# Patient Record
Sex: Male | Born: 1950 | Race: White | Hispanic: No | Marital: Married | Smoking: Former smoker
Health system: Southern US, Community
[De-identification: ages and names within clinical notes are randomized; demographics above are authoritative.]

## PROBLEM LIST (undated history)

## (undated) DIAGNOSIS — K760 Fatty (change of) liver, not elsewhere classified: Secondary | ICD-10-CM

## (undated) DIAGNOSIS — E119 Type 2 diabetes mellitus without complications: Secondary | ICD-10-CM

## (undated) DIAGNOSIS — M1990 Unspecified osteoarthritis, unspecified site: Secondary | ICD-10-CM

## (undated) DIAGNOSIS — K219 Gastro-esophageal reflux disease without esophagitis: Secondary | ICD-10-CM

## (undated) DIAGNOSIS — I10 Essential (primary) hypertension: Secondary | ICD-10-CM

## (undated) DIAGNOSIS — R7303 Prediabetes: Secondary | ICD-10-CM

## (undated) DIAGNOSIS — K635 Polyp of colon: Secondary | ICD-10-CM

## (undated) DIAGNOSIS — K644 Residual hemorrhoidal skin tags: Secondary | ICD-10-CM

## (undated) DIAGNOSIS — N183 Chronic kidney disease, stage 3 unspecified: Secondary | ICD-10-CM

## (undated) HISTORY — DX: Polyp of colon: K63.5

## (undated) HISTORY — DX: Type 2 diabetes mellitus without complications: E11.9

## (undated) HISTORY — DX: Chronic kidney disease, stage 3 unspecified: N18.30

## (undated) HISTORY — DX: Prediabetes: R73.03

## (undated) HISTORY — DX: Fatty (change of) liver, not elsewhere classified: K76.0

---

## 2000-06-05 ENCOUNTER — Ambulatory Visit (HOSPITAL_COMMUNITY): Admission: RE | Admit: 2000-06-05 | Discharge: 2000-06-05 | Payer: Self-pay | Admitting: *Deleted

## 2001-08-19 ENCOUNTER — Encounter (INDEPENDENT_AMBULATORY_CARE_PROVIDER_SITE_OTHER): Payer: Self-pay

## 2001-08-19 ENCOUNTER — Ambulatory Visit (HOSPITAL_COMMUNITY): Admission: RE | Admit: 2001-08-19 | Discharge: 2001-08-19 | Payer: Self-pay | Admitting: *Deleted

## 2007-02-19 ENCOUNTER — Ambulatory Visit (HOSPITAL_COMMUNITY): Admission: RE | Admit: 2007-02-19 | Discharge: 2007-02-19 | Payer: Self-pay | Admitting: *Deleted

## 2007-02-19 ENCOUNTER — Encounter (INDEPENDENT_AMBULATORY_CARE_PROVIDER_SITE_OTHER): Payer: Self-pay | Admitting: *Deleted

## 2010-06-14 NOTE — Op Note (Signed)
NAME:  Bradley Baldwin, Bradley Baldwin NO.:  [id number]   MEDICAL RECORD NO.:  [id number]          PATIENT TYPE:  AMB   LOCATION:  ENDO                         FACILITY:  [facility]   PHYSICIAN:  Georgiana Spinner, M.D.    DATE OF BIRTH:  [birth year]   DATE OF PROCEDURE:  02/19/2007  DATE OF DISCHARGE:                               OPERATIVE REPORT   PROCEDURE:  Colonoscopy.   INDICATIONS:  Colon polyps.   ANESTHESIA:  1. Fentanyl 62.5 mcg.  2. Versed 7 mg.   PROCEDURE:  With the patient mildly sedated in the left lateral  decubitus position, a rectal examination was attempted.  Subsequently,  the Pentax videoscopic colonoscope was inserted into the rectum and  passed under direct vision to the cecum identified by crow's foot of  cecum, ileocecal valve, both which were photographed.  From this point,  the colonoscope was slowly withdrawn taking circumferential views of the  colonic mucosa stopping in the splenic flexure area where a polyp was  seen, photographed, and removed using snare cautery technique setting of  20/150 blended current.  There was a small amount of residual polyp  remaining which I then removed using hot biopsy forceps with the same  setting.  The endoscope was then withdrawn all the way to the rectum  which appeared normal on direct and showed hemorrhoids on retroflexed  view.  The endoscope was straightened and withdrawn.  The patient's  vital signs, pulse oximeter remained stable.  The patient tolerated the  procedure well without apparent complication.   FINDINGS:  Polyp of splenic flexure.  Internal hemorrhoids and otherwise  an unremarkable exam.   PLAN:  Await biopsy report.  The patient will call me for results and  follow-up with me as an outpatient.           ______________________________  Georgiana Spinner, M.D.     GMO/MEDQ  D:  02/19/2007  T:  02/19/2007  Job:  [id number]

## 2010-06-17 NOTE — Procedures (Signed)
[facility]  Patient:    Bradley Baldwin, Bradley Baldwin Visit Number: [id number] MRN: [id number]          Service Type: END Location: ENDO Attending Physician:  Sabino Gasser Dictated by:   Sabino Gasser, M.D. Proc. Date: 08/19/01 Admit Date:  08/19/2001 Discharge Date: 08/19/2001                             Procedure Report  PROCEDURE:  Colonoscopy with biopsy.  INDICATION FOR PROCEDURE:  Colon polyp.  ANESTHESIA:  Demerol 90, Versed 9 mg.  DESCRIPTION OF PROCEDURE:  With the patient mildly sedated in the left lateral decubitus position, the Olympus videoscopic colonoscope was inserted into the rectum after a normal rectal exam and passed under direct vision to the cecum identified by the ileocecal valve and appendiceal orifice both of which were photographed. From this point, the colonoscope was slowly withdrawn taking circumferential views of the entire colonic mucosa, stopping only in the sigmoid colon where a previously noted polyp was seen, photographed and removed using hot biopsy forceps technique on a setting of 20:20 blended current. The endoscope was then withdrawn all the way to the rectum which appeared normal on direct and retroflexed view showed hemorrhoids. The endoscope was straightened and withdrawn.  The patients vital signs and pulse oximeter remained stable. The patient tolerated the procedure well without apparent complications.  FINDINGS:  Question of polyp 20 cm from the anal verge. Await biopsy report. The patient will call me for results and followup with me as an outpatient. Internal hemorrhoids and some diverticulosis of the sigmoid colon. Dictated by:   Sabino Gasser, M.D. Attending Physician:  Sabino Gasser DD:  08/19/01 TD:  08/23/01 Job: [id number]

## 2010-06-17 NOTE — Procedures (Signed)
[place]  Patient:    Bradley Baldwin, Bradley Baldwin                       MRN: [id number] Proc. Date: 06/05/00 Adm. Date:  [id number] Attending:  Sabino Gasser                           Procedure Report  PROCEDURE PERFORMED:  Colonoscopy.  INDICATIONS:  Rectal bleeding.  ANESTHESIA:  Demerol 60, Versed 6 mg.  DESCRIPTION OF PROCEDURE:  The patient mildly sedated in the left lateral decubitus position, the Olympus variable stiffness colonoscope was inserted into the rectum and passed under direct vision to the cecum identified by ileocecal valve and appendiceal orifice.  We entered into the terminal ileum which also appeared normal.  From this point, the colonoscope was slowly withdrawn taking circumferential views of the entire colonic mucosa.  Stopping in the sigmoid colon area where there appeared to be a small polyp but we could not discern this out after turning the scope directly to find it.  We then passed the scope back and forth two or three more times.  We could not find a polyp and therefore withdrew the colonoscope taking circumferential views of the entire colonic mucosa.  Stopping to photograph diverticula seen along the way in the sigmoid colon until we reached the rectum which appeared normal on direct view and showed internal hemorrhoids on retroflexed view. The endoscope was straightened and withdrawn.  The patients vital signs and pulse oximetry remained stable.  The patient tolerated the procedure well and left the operating room without apparent complications.  FINDINGS:  Question of a small polyp in the sigmoid colon.  Diverticulosis of the sigmoid colon.  Internal hemorrhoids.  PLAN:  Will follow the patient and repeat examination possibly in one year. DD:  06/05/00 TD:  06/05/00 Job: [id number]

## 2011-10-30 ENCOUNTER — Other Ambulatory Visit: Payer: Self-pay | Admitting: Family Medicine

## 2011-10-30 DIAGNOSIS — R748 Abnormal levels of other serum enzymes: Secondary | ICD-10-CM

## 2011-10-31 ENCOUNTER — Other Ambulatory Visit: Payer: Self-pay | Admitting: Orthopaedic Surgery

## 2011-11-02 ENCOUNTER — Ambulatory Visit
Admission: RE | Admit: 2011-11-02 | Discharge: 2011-11-02 | Disposition: A | Discharge status: Home / Self Care | Payer: BC Managed Care – PPO | Source: Ambulatory Visit | Attending: Family Medicine | Admitting: Family Medicine

## 2011-11-02 DIAGNOSIS — R748 Abnormal levels of other serum enzymes: Secondary | ICD-10-CM

## 2011-11-20 ENCOUNTER — Encounter (HOSPITAL_COMMUNITY): Payer: Self-pay | Admitting: Pharmacy Technician

## 2011-11-21 ENCOUNTER — Encounter (HOSPITAL_COMMUNITY): Payer: Self-pay

## 2011-11-21 ENCOUNTER — Ambulatory Visit (HOSPITAL_COMMUNITY)
Admission: RE | Admit: 2011-11-21 | Discharge: 2011-11-21 | Disposition: A | Discharge status: Home / Self Care | Payer: BC Managed Care – PPO | Source: Ambulatory Visit | Attending: Orthopaedic Surgery | Admitting: Orthopaedic Surgery

## 2011-11-21 ENCOUNTER — Encounter (HOSPITAL_COMMUNITY)
Admission: RE | Admit: 2011-11-21 | Discharge: 2011-11-21 | Disposition: A | Discharge status: Home / Self Care | Payer: BC Managed Care – PPO | Source: Ambulatory Visit | Attending: Orthopaedic Surgery | Admitting: Orthopaedic Surgery

## 2011-11-21 DIAGNOSIS — I10 Essential (primary) hypertension: Secondary | ICD-10-CM | POA: Insufficient documentation

## 2011-11-21 DIAGNOSIS — Z01818 Encounter for other preprocedural examination: Secondary | ICD-10-CM | POA: Insufficient documentation

## 2011-11-21 HISTORY — DX: Essential (primary) hypertension: I10

## 2011-11-21 LAB — BASIC METABOLIC PANEL
BUN: 14 mg/dL (ref 6–23)
CO2: 26 mEq/L (ref 19–32)
Chloride: 97 mEq/L (ref 96–112)
Creatinine, Ser: 0.89 mg/dL (ref 0.50–1.35)
GFR calc Af Amer: >90 mL/min (ref >90)
Potassium: 4.7 mEq/L (ref 3.5–5.1)

## 2011-11-21 LAB — SURGICAL PCR SCREEN: MRSA, PCR: NEGATIVE

## 2011-11-21 LAB — CBC WITH DIFFERENTIAL/PLATELET
Basophils Relative: 1 % (ref 0–1)
Eosinophils Relative: 2 % (ref 0–5)
HCT: 44.1 % (ref 39.0–52.0)
Hemoglobin: 15.8 g/dL (ref 13.0–17.0)
Lymphocytes Relative: 39 % (ref 12–46)
MCHC: 35.8 g/dL (ref 30.0–36.0)
MCV: 96.5 fL (ref 78.0–100.0)
Monocytes Absolute: 1 10*3/uL (ref 0.1–1.0)
Monocytes Relative: 10 % (ref 3–12)
Neutro Abs: 4.6 10*3/uL (ref 1.7–7.7)

## 2011-11-21 LAB — URINALYSIS, ROUTINE W REFLEX MICROSCOPIC
Leukocytes, UA: NEGATIVE
Protein, ur: NEGATIVE mg/dL
Specific Gravity, Urine: 1.01 (ref 1.005–1.030)
Urobilinogen, UA: 0.2 mg/dL (ref 0.0–1.0)

## 2011-11-21 LAB — TYPE AND SCREEN
ABO/RH(D): A POS
Antibody Screen: NEGATIVE

## 2011-11-21 NOTE — Pre-Procedure Instructions (Signed)
20 SHADEE MONTOYA  11/21/2011   Your procedure is scheduled on:  11/28/11  Report to [facility] at 600 AM.  Call this number if you have problems the morning of surgery: [phone]   Remember:   Do not eat food:After Midnight.    Take these medicines the morning of surgery with A SIP OF WATER: atenolol   Do not wear jewelry, make-up or nail polish.  Do not wear lotions, powders, or perfumes. You may wear deodorant.  Do not shave 48 hours prior to surgery. Men may shave face and neck.  Do not bring valuables to the hospital.  Contacts, dentures or bridgework may not be worn into surgery.  Leave suitcase in the car. After surgery it may be brought to your room.  For patients admitted to the hospital, checkout time is 11:00 AM the day of discharge.   Patients discharged the day of surgery will not be allowed to drive home.  Name and phone number of your driver: family  Special Instructions: Shower using CHG 2 nights before surgery and the night before surgery.  If you shower the day of surgery use CHG.  Use special wash - you have one bottle of CHG for all showers.  You should use approximately 1/3 of the bottle for each shower.   Please read over the following fact sheets that you were given: Pain Booklet, Coughing and Deep Breathing, Blood Transfusion Information, MRSA Information and Surgical Site Infection Prevention

## 2011-11-24 NOTE — H&P (Signed)
TOTAL HIP ADMISSION H&P  Patient is admitted for right total hip arthroplasty.  Subjective:  Chief Complaint: right hip pain  HPI: Bradley Baldwin, 61 y.o. male, has a history of pain and functional disability in the right hip(s) due to arthritis and patient has failed non-surgical conservative treatments for greater than 12 weeks to include NSAID's and/or analgesics, corticosteriod injections and activity modification.  Onset of symptoms was gradual starting 3 years ago with gradually worsening course since that time.The patient noted no past surgery on the right hip(s).  Patient currently rates pain in the right hip at 8 out of 10 with activity. Patient has night pain, worsening of pain with activity and weight bearing, pain that interfers with activities of daily living, pain with passive range of motion and crepitus. Patient has evidence of subchondral sclerosis, periarticular osteophytes and joint space narrowing by imaging studies. This condition presents safety issues increasing the risk of falls. This patient has had no previous surgery on this hip.  There is no current active infection.  There are no active problems to display for this patient.  Past Medical History  Diagnosis Date  . Hypertension     pcp   dr Cristina Gong    Past Surgical History  Procedure Date  . No past surgeries     No prescriptions prior to admission   No Known Allergies  History  Substance Use Topics  . Smoking status: Never Smoker   . Smokeless tobacco: Not on file  . Alcohol Use: Yes     wweekly    No family history on file.   Review of Systems  Constitutional: Negative.   HENT: Negative.   Eyes: Negative.   Respiratory: Negative.   Cardiovascular: Negative.   Gastrointestinal: Negative.   Genitourinary: Negative.   Musculoskeletal: Negative.   Skin: Negative.   Neurological: Negative.   Endo/Heme/Allergies: Negative.   Psychiatric/Behavioral: Negative.      Objective:  Physical Exam  Constitutional: He is oriented to person, place, and time. He appears well-nourished.  HENT:  Head: Normocephalic.  Eyes: Pupils are equal, round, and reactive to light.  Neck: Normal range of motion.  Cardiovascular: Regular rhythm.   Respiratory: Breath sounds normal.  GI: Bowel sounds are normal.  Musculoskeletal:       Right hip exam: Right hip motion is quite limited.  He walks with an altered gait.  He has extremely limited rotation and this motion is very painful for him.  Good neurovascular status distally.  Neurological: He is oriented to person, place, and time.  Skin: Skin is warm.  Psychiatric: He has a normal mood and affect.    Vital signs in last 24 hours:    Labs:   There is no height or weight on file to calculate BMI.   Imaging Review Plain radiographs demonstrate severe degenerative joint disease of the right hip(s). The bone quality appears to be good for age and reported activity level.  Assessment/Plan:  End stage arthritis, right hip(s)  The patient history, physical examination, clinical judgement of the provider and imaging studies are consistent with end stage degenerative joint disease of the right hip(s) and total hip arthroplasty is deemed medically necessary. The treatment options including medical management, injection therapy, arthroscopy and arthroplasty were discussed at length. The risks and benefits of total hip arthroplasty were presented and reviewed. The risks due to aseptic loosening, infection, stiffness, dislocation/subluxation,  thromboembolic complications and other imponderables were discussed.  The patient acknowledged the  explanation, agreed to proceed with the plan and consent was signed. Patient is being admitted for inpatient treatment for surgery, pain control, PT, OT, prophylactic antibiotics, VTE prophylaxis, progressive ambulation and ADL's and discharge planning.The patient is planning to be  discharged home with home health services

## 2011-11-27 MED ORDER — CHLORHEXIDINE GLUCONATE 4 % EX LIQD: 60.0000 mL | Freq: Once | CUTANEOUS | Status: DC

## 2011-11-27 MED ORDER — LACTATED RINGERS IV SOLN
INTRAVENOUS | Status: DC
  Administered 2011-11-28: 09:00:00 via INTRAVENOUS

## 2011-11-27 MED ORDER — CEFAZOLIN SODIUM-DEXTROSE 2-3 GM-% IV SOLR
2.0000 g | INTRAVENOUS | Status: AC
  Administered 2011-11-28: 2 g via INTRAVENOUS
  Filled 2011-11-27: qty 50

## 2011-11-27 NOTE — Progress Notes (Signed)
Patient notified of time change to be here at short stay at 0800 on 10/29.  Patient verbalized understanding. Nickolas Madrid

## 2011-11-28 ENCOUNTER — Encounter (HOSPITAL_COMMUNITY): Payer: Self-pay | Admitting: Certified Registered"

## 2011-11-28 ENCOUNTER — Encounter (HOSPITAL_COMMUNITY)
Admission: RE | Disposition: A | Discharge status: Home Health | Payer: Self-pay | Source: Ambulatory Visit | Attending: Orthopaedic Surgery

## 2011-11-28 ENCOUNTER — Ambulatory Visit (HOSPITAL_COMMUNITY): Payer: BC Managed Care – PPO | Admitting: Certified Registered"

## 2011-11-28 ENCOUNTER — Ambulatory Visit (HOSPITAL_COMMUNITY): Payer: BC Managed Care – PPO

## 2011-11-28 ENCOUNTER — Inpatient Hospital Stay (HOSPITAL_COMMUNITY)
Admission: RE | Admit: 2011-11-28 | Discharge: 2011-12-01 | DRG: 818 | Disposition: A | Discharge status: Home Health | Payer: BC Managed Care – PPO | Source: Ambulatory Visit | Attending: Orthopaedic Surgery | Admitting: Orthopaedic Surgery

## 2011-11-28 DIAGNOSIS — M1610 Unilateral primary osteoarthritis, unspecified hip: Principal | ICD-10-CM | POA: Diagnosis present

## 2011-11-28 DIAGNOSIS — M169 Osteoarthritis of hip, unspecified: Principal | ICD-10-CM | POA: Diagnosis present

## 2011-11-28 DIAGNOSIS — I10 Essential (primary) hypertension: Secondary | ICD-10-CM | POA: Diagnosis present

## 2011-11-28 DIAGNOSIS — K644 Residual hemorrhoidal skin tags: Secondary | ICD-10-CM | POA: Diagnosis present

## 2011-11-28 HISTORY — DX: Residual hemorrhoidal skin tags: K64.4

## 2011-11-28 HISTORY — DX: Unspecified osteoarthritis, unspecified site: M19.90

## 2011-11-28 HISTORY — DX: Gastro-esophageal reflux disease without esophagitis: K21.9

## 2011-11-28 HISTORY — PX: TOTAL HIP ARTHROPLASTY: SHX124

## 2011-11-28 SURGERY — ARTHROPLASTY, HIP, TOTAL, ANTERIOR APPROACH
Anesthesia: General | Site: Hip | Laterality: Right

## 2011-11-28 MED ORDER — FLEET ENEMA 7-19 GM/118ML RE ENEM: 1.0000 | ENEMA | Freq: Once | RECTAL | Status: AC | PRN

## 2011-11-28 MED ORDER — ROCURONIUM BROMIDE 100 MG/10ML IV SOLN
INTRAVENOUS | Status: DC | PRN
  Administered 2011-11-28: 10 mg via INTRAVENOUS
  Administered 2011-11-28: 20 mg via INTRAVENOUS
  Administered 2011-11-28 (×2): 10 mg via INTRAVENOUS
  Administered 2011-11-28: 50 mg via INTRAVENOUS

## 2011-11-28 MED ORDER — ALUM & MAG HYDROXIDE-SIMETH 200-200-20 MG/5ML PO SUSP: 30.0000 mL | ORAL | Status: DC | PRN

## 2011-11-28 MED ORDER — HYDROMORPHONE HCL PF 1 MG/ML IJ SOLN
0.2500 mg | INTRAMUSCULAR | Status: DC | PRN
  Administered 2011-11-28 (×2): 0.5 mg via INTRAVENOUS

## 2011-11-28 MED ORDER — 0.9 % SODIUM CHLORIDE (POUR BTL) OPTIME
TOPICAL | Status: DC | PRN
  Administered 2011-11-28: 1000 mL

## 2011-11-28 MED ORDER — OXYCODONE HCL 5 MG PO TABS
5.0000 mg | ORAL_TABLET | Freq: Once | ORAL | Status: AC | PRN
  Administered 2011-11-28: 5 mg via ORAL

## 2011-11-28 MED ORDER — HYDROMORPHONE HCL PF 1 MG/ML IJ SOLN
INTRAMUSCULAR | Status: AC
  Filled 2011-11-28: qty 1

## 2011-11-28 MED ORDER — ALBUMIN HUMAN 5 % IV SOLN
INTRAVENOUS | Status: DC | PRN
  Administered 2011-11-28: 11:00:00 via INTRAVENOUS

## 2011-11-28 MED ORDER — CEFAZOLIN SODIUM-DEXTROSE 2-3 GM-% IV SOLR
2.0000 g | Freq: Four times a day (QID) | INTRAVENOUS | Status: AC
  Administered 2011-11-28 – 2011-11-29 (×2): 2 g via INTRAVENOUS
  Filled 2011-11-28 (×2): qty 50

## 2011-11-28 MED ORDER — ASPIRIN EC 325 MG PO TBEC
325.0000 mg | DELAYED_RELEASE_TABLET | Freq: Two times a day (BID) | ORAL | Status: DC
  Administered 2011-11-29 – 2011-12-01 (×5): 325 mg via ORAL
  Filled 2011-11-28 (×7): qty 1

## 2011-11-28 MED ORDER — HYDROCHLOROTHIAZIDE 12.5 MG PO CAPS
12.5000 mg | ORAL_CAPSULE | Freq: Every day | ORAL | Status: DC
  Administered 2011-11-29 – 2011-11-30 (×2): 12.5 mg via ORAL
  Filled 2011-11-28 (×4): qty 1

## 2011-11-28 MED ORDER — EPHEDRINE SULFATE 50 MG/ML IJ SOLN
INTRAMUSCULAR | Status: DC | PRN
  Administered 2011-11-28: 10 mg via INTRAVENOUS
  Administered 2011-11-28: 5 mg via INTRAVENOUS

## 2011-11-28 MED ORDER — METOCLOPRAMIDE HCL 5 MG/ML IJ SOLN: 5.0000 mg | Freq: Three times a day (TID) | INTRAMUSCULAR | Status: DC | PRN

## 2011-11-28 MED ORDER — NEOSTIGMINE METHYLSULFATE 1 MG/ML IJ SOLN
INTRAMUSCULAR | Status: DC | PRN
  Administered 2011-11-28: 4 mg via INTRAVENOUS

## 2011-11-28 MED ORDER — OXYCODONE HCL 5 MG/5ML PO SOLN: 5.0000 mg | Freq: Once | ORAL | Status: AC | PRN

## 2011-11-28 MED ORDER — ONDANSETRON HCL 4 MG/2ML IJ SOLN: 4.0000 mg | Freq: Four times a day (QID) | INTRAMUSCULAR | Status: DC | PRN

## 2011-11-28 MED ORDER — SENNOSIDES-DOCUSATE SODIUM 8.6-50 MG PO TABS: 1.0000 | ORAL_TABLET | Freq: Every evening | ORAL | Status: DC | PRN

## 2011-11-28 MED ORDER — LIDOCAINE HCL (CARDIAC) 20 MG/ML IV SOLN
INTRAVENOUS | Status: DC | PRN
  Administered 2011-11-28: 100 mg via INTRAVENOUS

## 2011-11-28 MED ORDER — OXYCODONE HCL 5 MG PO TABS
ORAL_TABLET | ORAL | Status: AC
  Filled 2011-11-28: qty 1

## 2011-11-28 MED ORDER — ACETAMINOPHEN 325 MG PO TABS
650.0000 mg | ORAL_TABLET | Freq: Four times a day (QID) | ORAL | Status: DC | PRN
  Administered 2011-11-29: 650 mg via ORAL
  Filled 2011-11-28: qty 2

## 2011-11-28 MED ORDER — OXYCODONE HCL 5 MG PO TABS
5.0000 mg | ORAL_TABLET | ORAL | Status: DC | PRN
  Administered 2011-11-28: 5 mg via ORAL
  Administered 2011-11-29 – 2011-12-01 (×13): 10 mg via ORAL
  Filled 2011-11-28 (×14): qty 2

## 2011-11-28 MED ORDER — ONDANSETRON HCL 4 MG/2ML IJ SOLN
INTRAMUSCULAR | Status: DC | PRN
  Administered 2011-11-28: 4 mg via INTRAVENOUS

## 2011-11-28 MED ORDER — MIDAZOLAM HCL 5 MG/5ML IJ SOLN
INTRAMUSCULAR | Status: DC | PRN
  Administered 2011-11-28: 2 mg via INTRAVENOUS

## 2011-11-28 MED ORDER — LISINOPRIL 40 MG PO TABS
40.0000 mg | ORAL_TABLET | Freq: Every day | ORAL | Status: DC
  Administered 2011-11-30: 40 mg via ORAL
  Filled 2011-11-28 (×4): qty 1

## 2011-11-28 MED ORDER — DOCUSATE SODIUM 100 MG PO CAPS
100.0000 mg | ORAL_CAPSULE | Freq: Two times a day (BID) | ORAL | Status: DC
  Administered 2011-11-29 – 2011-11-30 (×4): 100 mg via ORAL
  Filled 2011-11-28 (×7): qty 1

## 2011-11-28 MED ORDER — ONDANSETRON HCL 4 MG PO TABS: 4.0000 mg | ORAL_TABLET | Freq: Four times a day (QID) | ORAL | Status: DC | PRN

## 2011-11-28 MED ORDER — METHOCARBAMOL 500 MG PO TABS
500.0000 mg | ORAL_TABLET | Freq: Four times a day (QID) | ORAL | Status: DC | PRN
  Administered 2011-11-29 – 2011-12-01 (×3): 500 mg via ORAL
  Filled 2011-11-28 (×4): qty 1

## 2011-11-28 MED ORDER — PROMETHAZINE HCL 25 MG/ML IJ SOLN: 6.2500 mg | INTRAMUSCULAR | Status: DC | PRN

## 2011-11-28 MED ORDER — PHENOL 1.4 % MT LIQD: 1.0000 | OROMUCOSAL | Status: DC | PRN

## 2011-11-28 MED ORDER — HYDROMORPHONE HCL PF 1 MG/ML IJ SOLN
0.5000 mg | INTRAMUSCULAR | Status: DC | PRN
  Administered 2011-11-28 – 2011-11-29 (×5): 1 mg via INTRAVENOUS
  Filled 2011-11-28 (×5): qty 1

## 2011-11-28 MED ORDER — METOCLOPRAMIDE HCL 10 MG PO TABS: 5.0000 mg | ORAL_TABLET | Freq: Three times a day (TID) | ORAL | Status: DC | PRN

## 2011-11-28 MED ORDER — ATENOLOL 50 MG PO TABS
50.0000 mg | ORAL_TABLET | Freq: Every day | ORAL | Status: DC
  Administered 2011-11-29 – 2011-12-01 (×3): 50 mg via ORAL
  Filled 2011-11-28 (×4): qty 1

## 2011-11-28 MED ORDER — MENTHOL 3 MG MT LOZG: 1.0000 | LOZENGE | OROMUCOSAL | Status: DC | PRN

## 2011-11-28 MED ORDER — LACTATED RINGERS IV SOLN
INTRAVENOUS | Status: DC | PRN
  Administered 2011-11-28 (×3): via INTRAVENOUS

## 2011-11-28 MED ORDER — PHENYLEPHRINE HCL 10 MG/ML IJ SOLN
INTRAMUSCULAR | Status: DC | PRN
  Administered 2011-11-28: 40 ug via INTRAVENOUS
  Administered 2011-11-28: 80 ug via INTRAVENOUS
  Administered 2011-11-28: 40 ug via INTRAVENOUS
  Administered 2011-11-28: 80 ug via INTRAVENOUS
  Administered 2011-11-28: 40 ug via INTRAVENOUS
  Administered 2011-11-28 (×2): 80 ug via INTRAVENOUS
  Administered 2011-11-28 (×2): 40 ug via INTRAVENOUS

## 2011-11-28 MED ORDER — LACTATED RINGERS IV SOLN
INTRAVENOUS | Status: DC
  Administered 2011-11-29: 02:00:00 via INTRAVENOUS

## 2011-11-28 MED ORDER — GLYCOPYRROLATE 0.2 MG/ML IJ SOLN
INTRAMUSCULAR | Status: DC | PRN
  Administered 2011-11-28: .6 mg via INTRAVENOUS

## 2011-11-28 MED ORDER — ACETAMINOPHEN 650 MG RE SUPP: 650.0000 mg | Freq: Four times a day (QID) | RECTAL | Status: DC | PRN

## 2011-11-28 MED ORDER — PROPOFOL 10 MG/ML IV BOLUS
INTRAVENOUS | Status: DC | PRN
  Administered 2011-11-28: 200 mg via INTRAVENOUS

## 2011-11-28 MED ORDER — METHOCARBAMOL 100 MG/ML IJ SOLN
500.0000 mg | Freq: Four times a day (QID) | INTRAVENOUS | Status: DC | PRN
  Administered 2011-11-28: 500 mg via INTRAVENOUS
  Filled 2011-11-28: qty 5

## 2011-11-28 MED ORDER — FENTANYL CITRATE 0.05 MG/ML IJ SOLN
INTRAMUSCULAR | Status: DC | PRN
  Administered 2011-11-28: 50 ug via INTRAVENOUS
  Administered 2011-11-28: 100 ug via INTRAVENOUS
  Administered 2011-11-28 (×5): 50 ug via INTRAVENOUS

## 2011-11-28 SURGICAL SUPPLY — 57 items
ACETAB CUP W/GRIPTION 54 (Plate) ×2 IMPLANT
BLADE SAW SGTL 18X1.27X75 (BLADE) ×2 IMPLANT
BLADE SURG ROTATE 9660 (MISCELLANEOUS) IMPLANT
CELLS DAT CNTRL 66122 CELL SVR (MISCELLANEOUS) ×1 IMPLANT
CLOTH BEACON ORANGE TIMEOUT ST (SAFETY) ×2 IMPLANT
COVER BACK TABLE 24X17X13 BIG (DRAPES) IMPLANT
COVER SURGICAL LIGHT HANDLE (MISCELLANEOUS) ×2 IMPLANT
CUP ACETAB W/GRIPTION 54 (Plate) ×1 IMPLANT
Corail KA12 Femoral stem ×2 IMPLANT
DRAPE C-ARM 42X72 X-RAY (DRAPES) ×2 IMPLANT
DRAPE STERI IOBAN 125X83 (DRAPES) ×2 IMPLANT
DRAPE U-SHAPE 47X51 STRL (DRAPES) ×6 IMPLANT
DRSG MEPILEX BORDER 4X12 (GAUZE/BANDAGES/DRESSINGS) IMPLANT
DRSG MEPILEX BORDER 4X8 (GAUZE/BANDAGES/DRESSINGS) ×2 IMPLANT
DURAPREP 26ML APPLICATOR (WOUND CARE) ×2 IMPLANT
ELECT BLADE 4.0 EZ CLEAN MEGAD (MISCELLANEOUS)
ELECT BLADE TIP CTD 4 INCH (ELECTRODE) ×2 IMPLANT
ELECT CAUTERY BLADE 6.4 (BLADE) ×2 IMPLANT
ELECT REM PT RETURN 9FT ADLT (ELECTROSURGICAL) ×2
ELECTRODE BLDE 4.0 EZ CLN MEGD (MISCELLANEOUS) IMPLANT
ELECTRODE REM PT RTRN 9FT ADLT (ELECTROSURGICAL) ×1 IMPLANT
ELIMINATOR HOLE APEX DEPUY (Hips) ×2 IMPLANT
FACESHIELD LNG OPTICON STERILE (SAFETY) ×4 IMPLANT
GAUZE XEROFORM 1X8 LF (GAUZE/BANDAGES/DRESSINGS) ×2 IMPLANT
GLOVE BIO SURGEON STRL SZ8 (GLOVE) ×2 IMPLANT
GLOVE BIO SURGEON STRL SZ8.5 (GLOVE) ×2 IMPLANT
GLOVE BIOGEL PI IND STRL 8 (GLOVE) ×1 IMPLANT
GLOVE BIOGEL PI IND STRL 8.5 (GLOVE) ×1 IMPLANT
GLOVE BIOGEL PI INDICATOR 8 (GLOVE) ×1
GLOVE BIOGEL PI INDICATOR 8.5 (GLOVE) ×1
GOWN PREVENTION PLUS LG XLONG (DISPOSABLE) IMPLANT
GOWN STRL NON-REIN LRG LVL3 (GOWN DISPOSABLE) ×4 IMPLANT
GOWN STRL REIN XL XLG (GOWN DISPOSABLE) ×2 IMPLANT
HEAD CERAMIC DELTA 36 PLUS 1.5 (Hips) ×2 IMPLANT
KIT BASIN OR (CUSTOM PROCEDURE TRAY) ×2 IMPLANT
KIT ROOM TURNOVER OR (KITS) ×2 IMPLANT
LINER NEUTRAL 54X36MM PLUS 4 (Hips) ×2 IMPLANT
MANIFOLD NEPTUNE II (INSTRUMENTS) ×2 IMPLANT
NS IRRIG 1000ML POUR BTL (IV SOLUTION) ×2 IMPLANT
PACK TOTAL JOINT (CUSTOM PROCEDURE TRAY) ×2 IMPLANT
PAD ARMBOARD 7.5X6 YLW CONV (MISCELLANEOUS) ×4 IMPLANT
RTRCTR WOUND ALEXIS 18CM MED (MISCELLANEOUS) ×2
SPONGE LAP 18X18 X RAY DECT (DISPOSABLE) ×2 IMPLANT
SPONGE LAP 4X18 X RAY DECT (DISPOSABLE) IMPLANT
STAPLER VISISTAT 35W (STAPLE) ×2 IMPLANT
SUT ETHIBOND NAB CT1 #1 30IN (SUTURE) ×4 IMPLANT
SUT VIC AB 0 CT1 27 (SUTURE)
SUT VIC AB 0 CT1 27XBRD ANBCTR (SUTURE) IMPLANT
SUT VIC AB 1 CT1 27 (SUTURE) ×1
SUT VIC AB 1 CT1 27XBRD ANBCTR (SUTURE) ×1 IMPLANT
SUT VIC AB 2-0 CT1 27 (SUTURE) ×1
SUT VIC AB 2-0 CT1 TAPERPNT 27 (SUTURE) ×1 IMPLANT
SUT VLOC 180 0 24IN GS25 (SUTURE) ×2 IMPLANT
TOWEL OR 17X24 6PK STRL BLUE (TOWEL DISPOSABLE) ×2 IMPLANT
TOWEL OR 17X26 10 PK STRL BLUE (TOWEL DISPOSABLE) ×4 IMPLANT
TRAY FOLEY CATH 14FR (SET/KITS/TRAYS/PACK) IMPLANT
WATER STERILE IRR 1000ML POUR (IV SOLUTION) ×4 IMPLANT

## 2011-11-28 NOTE — Op Note (Addendum)
PRE-OP DIAGNOSIS:  RIGHT HIP DEGENERATIVE JOINT DISEASE POST-OP DIAGNOSIS:  RIGHT HIP DEGENERATIVE JOINT DISEASE PROCEDURE:  Procedure(s): RIGHT TOTAL HIP ARTHROPLASTY ANTERIOR APPROACH ANESTHESIA:  General SURGEON:  Marcene Corning MD ASSISTANT:  Lindwood Qua PA-C   INDICATIONS FOR PROCEDURE:  The patient is a 61 y.o. male with a long history of a painful hip.  This has persisted despite multiple conservative measures.  The patient has persisted with pain and dysfunction making rest and activity difficult.  A total hip replacement is offered as surgical treatment.  Informed operative consent was obtained after discussion of possible complications including reaction to anesthesia, infection, neurovascular injury, dislocation, DVT, PE, and death.  The importance of the postoperative rehab program to optimize result was stressed with the patient.  SUMMARY OF FINDINGS AND PROCEDURE:  Under general anesthesia through a anterior approach an the Hana table a right THR was performed.  The patient had severe degenerative change and excellent bone quality.  We used DePuy components to replace the hip and these were size 12 Corail femur capped with a +1.5 36mm ceramic hip ball.  On the acetabular side we used a size 54 Gription shell with a plus 4 neutral polyethylene liner.  We did use a hole eliminator.  Bryna Colander assisted throughout and was invaluable to the completion of the case in that he helped position and retract while I performed the procedure.  He also closed simultaneously to help minimize OR time.  DESCRIPTION OF PROCEDURE:  The patient was taken to the OR suite where general anesthetic was applied.  The patient was then positioned on the Hana table supine.  All bony prominences were appropriately padded.  Prep and drape was then performed in normal sterile fashion.  The patient was given Kefzol preoperative antibiotic and an appropriate time out was performed.  We then took an anterior  approach to the right hip.  Dissection was taken through adipose to the tensor fascia lata fascia.  This structure was incised longitudinally and we dissected in the intermuscular interval just medial to this muscle.  Cobra retractors were placed superior and inferior to the femoral neck superficial to the capsule.  A capsular incision was then made and the retractors were placed along the femoral neck.  Xray was brought in to get a good level for the femoral neck cut which was made with an oscillating saw and osteotome.  The femoral head was removed with a corkscrew.  The acetabulum was exposed and some labral tissues were excised. Reaming was taken to the inside wall of the pelvis and sequentially up to 1 mm smaller than the actual component.  A trial of components was done and then the aforementioned acetabular shell was placed in appropriate tilt and anteversion confirmed by fluoroscopy. The liner was placed along with the hole eliminator and attention was turned to the femur.  The leg was brought down and over into adduction and the elevator bar was used to raise the femur up gently in the wound.  The piriformis was released with care taken to preserve the obturator internus attachment and all of the posterior capsule. The femur was reamed and then broached to the appropriate size.  A trial reduction was done and the aforementioned head and neck assembly gave [place] the best stability in extension with external rotation.  Leg lengths were felt to be about equal by fluoroscopic exam.  The trial components were removed and the wound irrigated.  We then placed the femoral component in appropriate  anteversion.  The head was applied to a dry stem neck and the hip again reduced.  It was again stable in the aforementioned position.  The would was irrigated again followed by re-approximation of anterior capsule with ethibond suture. Tensor fascia was repaired with V-loc suture  followed by subcutaneous closure with #O and  #2 undyed vicryl.  Skin was closed with staples followed by a sterile dressing.  EBL and IOF can be obtained from anesthesia records.  DISPOSITION:  The patient was extubated in the OR and taken to PACU in stable condition to be admitted to the Orthopedic Surgery for appropriate post-op care to include perioperative antibiotics and DVT prophylaxis.

## 2011-11-28 NOTE — Anesthesia Postprocedure Evaluation (Signed)
  Anesthesia Post-op Note  Patient: Bradley Baldwin  Procedure(s) Performed: Procedure(s) (LRB) with comments: TOTAL HIP ARTHROPLASTY ANTERIOR APPROACH (Right)  Patient Location: PACU  Anesthesia Type:General  Level of Consciousness: awake, alert , oriented and patient cooperative  Airway and Oxygen Therapy: Patient Spontanous Breathing  Post-op Pain: mild  Post-op Assessment: Post-op Vital signs reviewed, Patient's Cardiovascular Status Stable, Respiratory Function Stable, Patent Airway, No signs of Nausea or vomiting and Pain level controlled  Post-op Vital Signs: stable  Complications: No apparent anesthesia complications

## 2011-11-28 NOTE — Interval H&P Note (Signed)
History and Physical Interval Note:  11/28/2011 9:04 AM  Bradley Baldwin  has presented today for surgery, with the diagnosis of DJD RIGHT HIP  The various methods of treatment have been discussed with the patient and family. After consideration of risks, benefits and other options for treatment, the patient has consented to  Procedure(s) (LRB) with comments: TOTAL HIP ARTHROPLASTY ANTERIOR APPROACH (Right) as a surgical intervention .  The patient's history has been reviewed, patient examined, no change in status, stable for surgery.  I have reviewed the patient's chart and labs.  Questions were answered to the patient's satisfaction.     [name]

## 2011-11-28 NOTE — Anesthesia Procedure Notes (Addendum)
Procedure Name: Intubation Date/Time: 11/28/2011 9:32 AM Performed by: Ellin Goodie Pre-anesthesia Checklist: Patient identified, Emergency Drugs available, Suction available, Patient being monitored and Timeout performed Patient Re-evaluated:Patient Re-evaluated prior to inductionOxygen Delivery Method: Circle system utilized Preoxygenation: Pre-oxygenation with 100% oxygen Intubation Type: IV induction Ventilation: Mask ventilation without difficulty Laryngoscope Size: Mac and 4 Grade View: Grade I Tube type: Oral Tube size: 7.5 mm Number of attempts: 1 Airway Equipment and Method: Stylet Placement Confirmation: ETT inserted through vocal cords under direct vision,  positive ETCO2 and breath sounds checked- equal and bilateral Secured at: 22 cm Tube secured with: Tape Dental Injury: Teeth and Oropharynx as per pre-operative assessment  Comments: Intubation by Heron Nay, CRNA    Performed by: Ellin Goodie    Performed by: Ellin Goodie

## 2011-11-28 NOTE — Plan of Care (Signed)
Problem: Consults Goal: Diagnosis- Total Joint Replacement Primary Total Hip     

## 2011-11-28 NOTE — Transfer of Care (Signed)
Immediate Anesthesia Transfer of Care Note  Patient: Bradley Baldwin  Procedure(s) Performed: Procedure(s) (LRB) with comments: TOTAL HIP ARTHROPLASTY ANTERIOR APPROACH (Right)  Patient Location: PACU  Anesthesia Type:General  Level of Consciousness: awake, alert  and oriented  Airway & Oxygen Therapy: Patient Spontanous Breathing  Post-op Assessment: Report given to PACU RN  Post vital signs: stable  Complications: No apparent anesthesia complications

## 2011-11-28 NOTE — Anesthesia Preprocedure Evaluation (Addendum)
Anesthesia Evaluation  Patient identified by MRN, date of birth, ID band Patient awake    Reviewed: Allergy & Precautions, H&P , NPO status , Patient's Chart, lab work & pertinent test results, reviewed documented beta blocker date and time   History of Anesthesia Complications Negative for: history of anesthetic complications  Airway Mallampati: II TM Distance: >3 FB Neck ROM: Full    Dental  (+) Teeth Intact and Dental Advisory Given   Pulmonary neg pulmonary ROS,  breath sounds clear to auscultation  Pulmonary exam normal       Cardiovascular hypertension, Pt. on home beta blockers     Neuro/Psych negative neurological ROS     GI/Hepatic negative GI ROS, Neg liver ROS,   Endo/Other  negative endocrine ROS  Renal/GU negative Renal ROS     Musculoskeletal   Abdominal (+)  Abdomen: soft. Bowel sounds: normal.  Peds  Hematology   Anesthesia Other Findings   Reproductive/Obstetrics                         Anesthesia Physical Anesthesia Plan  ASA: III  Anesthesia Plan: General   Post-op Pain Management:    Induction: Intravenous  Airway Management Planned: Oral ETT  Additional Equipment:   Intra-op Plan:   Post-operative Plan: Extubation in OR  Informed Consent: I have reviewed the patients History and Physical, chart, labs and discussed the procedure including the risks, benefits and alternatives for the proposed anesthesia with the patient or authorized representative who has indicated his/her understanding and acceptance.   Dental advisory given  Plan Discussed with: CRNA, Anesthesiologist and Surgeon  Anesthesia Plan Comments:         Anesthesia Quick Evaluation

## 2011-11-29 LAB — BASIC METABOLIC PANEL
GFR calc Af Amer: 66 mL/min — ABNORMAL LOW (ref >90)
GFR calc non Af Amer: 57 mL/min — ABNORMAL LOW (ref >90)
Potassium: 5 mEq/L (ref 3.5–5.1)
Sodium: 124 mEq/L — ABNORMAL LOW (ref 135–145)

## 2011-11-29 LAB — CBC
Hemoglobin: 11.3 g/dL — ABNORMAL LOW (ref 13.0–17.0)
MCHC: 35.2 g/dL (ref 30.0–36.0)
RDW: 11.9 % (ref 11.5–15.5)

## 2011-11-29 NOTE — Progress Notes (Signed)
[facility]  Patient Status: New  [facility] is providing the following services: PT  If patient discharges after hours, please call [phone].   Bradley Baldwin 11/29/2011, 3:58 PM

## 2011-11-29 NOTE — Evaluation (Signed)
I agree with the following treatment note after reviewing documentation.   Johnston, Zion Lint Brynn   OTR/L Pager: [phone] Office: [phone] .   

## 2011-11-29 NOTE — Evaluation (Signed)
Occupational Therapy Evaluation Patient Details Name: Bradley Baldwin MRN: [id number] DOB: [birth year] Today's Date: 11/29/2011 Time: 6578-4696 OT Time Calculation (min): 15 min  OT Assessment / Plan / Recommendation Clinical Impression  Pt 61 yo male s/p right anterior hip arthroplasty. Pt. would benefit from OT acutely to increase independence with mobility and ADL's.     OT Assessment  Patient needs continued OT Services    Follow Up Recommendations  Home health OT;Supervision - Intermittent    Barriers to Discharge      Equipment Recommendations  None recommended by OT (pt has DME)    Recommendations for Other Services    Frequency  Min 2X/week    Precautions / Restrictions Precautions Precautions: Anterior Hip Precaution Comments: able to recall 3/3 Restrictions Weight Bearing Restrictions: Yes RLE Weight Bearing: Weight bearing as tolerated   Pertinent Vitals/Pain Pain 4/10, Notified RN Bev pt requesting pain meds.     ADL  Grooming: Performed;Wash/dry hands;Supervision/safety Where Assessed - Grooming: Unsupported standing Toilet Transfer: Simulated;Minimal Dentist Method: Sit to Barista: Raised toilet seat with arms (or 3-in-1 over toilet) Toileting - Clothing Manipulation and Hygiene: Performed;Independent Where Assessed - Toileting Clothing Manipulation and Hygiene: Standing Equipment Used: Gait belt;Rolling Totty Transfers/Ambulation Related to ADLs: min (A) and vc's for correct hand placement ADL Comments: Ambulated to bathroom to urinate and completed hand washing at the sink at supervision level. Pt verbalized curiosity with how he will get into his tub, plan to address this next session. Pt able to recall 3/3 precautions prior to beginning session.     OT Diagnosis: Generalized weakness;Acute pain  OT Problem List: Decreased activity tolerance;Decreased safety awareness;Decreased knowledge of use of DME or  AE;Pain OT Treatment Interventions: Self-care/ADL training;Therapeutic exercise;DME and/or AE instruction;Therapeutic activities;Patient/family education   OT Goals Acute Rehab OT Goals OT Goal Formulation: With patient Time For Goal Achievement: 12/13/11 Potential to Achieve Goals: Good ADL Goals Pt Will Perform Lower Body Dressing: with supervision;Sit to stand from chair ADL Goal: Lower Body Dressing - Progress: Goal set today Pt Will Perform Tub/Shower Transfer: with supervision;Maintaining hip precautions;Other (comment) (with 3n1) ADL Goal: Tub/Shower Transfer - Progress: Goal set today Miscellaneous OT Goals Miscellaneous OT Goal #1: Pt will adhere to 100% of hip precautions throughout ADL's prior to d/c OT Goal: Miscellaneous Goal #1 - Progress: Goal set today  Visit Information  Last OT Received On: 11/29/11 Assistance Needed: +1    Subjective Data  Subjective: I feel alright, I just finished with the other therapy  Patient Stated Goal: To go back home.    Prior Functioning     Home Living Lives With: Spouse Available Help at Discharge: Family;Available 24 hours/day Type of Home: House Home Access: Stairs to enter [employer] of Steps: 5 Entrance Stairs-Rails: Right Home Layout: One level Bathroom Shower/Tub: Engineer, manufacturing systems: Standard Bathroom Accessibility: Yes How Accessible: Accessible via [place] Home Adaptive Equipment: Bedside commode/3-in-1;Curto - rolling Prior Function Level of Independence: Independent Able to Take Stairs?: Yes Driving: Yes Vocation: Unemployed Communication Communication: No difficulties Dominant Hand: Right         Vision/Perception     Cognition  Overall Cognitive Status: Appears within functional limits for tasks assessed/performed Arousal/Alertness: Awake/alert Orientation Level: Appears intact for tasks assessed Behavior During Session: [facility] for tasks performed    Extremity/Trunk  Assessment Right Upper Extremity Assessment RUE ROM/Strength/Tone: Within functional levels Left Upper Extremity Assessment LUE ROM/Strength/Tone: Within functional levels Trunk Assessment Trunk Assessment: Normal  Mobility Bed Mobility Bed Mobility: Not assessed Transfers Transfers: Sit to Stand;Stand to Sit Sit to Stand: 4: Min assist;With upper extremity assist;With armrests;From chair/3-in-1 Stand to Sit: 4: Min guard;With upper extremity assist;With armrests;To chair/3-in-1 Details for Transfer Assistance: cueing for hand placement                     End of Session OT - End of Session Equipment Utilized During Treatment: Gait belt Activity Tolerance: Patient tolerated treatment well Patient left: in chair;with call bell/phone within reach Nurse Communication: Mobility status;Patient requests pain meds  GO     [name] 11/29/2011, 3:16 PM

## 2011-11-29 NOTE — Progress Notes (Signed)
Physical Therapy Treatment Note   11/29/11 1420  PT Visit Information  Last PT Received On 11/29/11  Assistance Needed +1  PT Time Calculation  PT Start Time 1420  PT Stop Time 1439  PT Time Calculation (min) 19 min  Subjective Data  Subjective Pt received supine in bed agreeable to PT.  Precautions  Precautions Anterior Hip  Precaution Comments able to recall 4/4  Restrictions  RLE Weight Bearing WBAT  Cognition  Overall Cognitive Status Appears within functional limits for tasks assessed/performed  Arousal/Alertness Awake/alert  Orientation Level Appears intact for tasks assessed  Behavior During Session [facility] for tasks performed  Bed Mobility  Bed Mobility Supine to Sit  Supine to Sit 4: Min assist  Details for Bed Mobility Assistance v/c's for technique, minA for R LE management  Transfers  Transfers Sit to Stand;Stand to Sit  Sit to Stand 4: Min assist;With upper extremity assist;With armrests;From chair/3-in-1  Stand to Sit 4: Min guard;With upper extremity assist;With armrests;To chair/3-in-1  Ambulation/Gait  Ambulation/Gait Assistance 4: Min guard  Ambulation Distance (Feet) 75 Feet  Assistive device Rolling Helmers  Gait Pattern Step-to pattern;Decreased step length - right;Decreased stance time - right;Decreased stride length  Gait velocity slow  Stairs No  Exercises  Exercises (pt reports doing ther ex in bed)  PT - End of Session  Equipment Utilized During Treatment Gait belt  Activity Tolerance Patient tolerated treatment well  Patient left in chair;with call bell/phone within reach  PT - Assessment/Plan  Comments on Treatment Session Pt demonstrated good progress this afternoon. Anticipate patient to be safe for d/c home when approved by MD.  PT Plan Discharge plan remains appropriate  PT Frequency 7X/week  Follow Up Recommendations Home health PT  Equipment Recommended None recommended by OT  Acute Rehab PT Goals  PT Goal: Supine/Side to Sit - Progress  Progressing toward goal  PT Goal: Sit to Stand - Progress Progressing toward goal  PT Goal: Ambulate - Progress Progressing toward goal  PT Goal: Perform Home Exercise Program - Progress Progressing toward goal  Additional Goals  PT Goal: Additional Goal #1 - Progress Met  PT General Charges  $$ ACUTE PT VISIT 1 Procedure  PT Treatments  $Gait Training 8-22 mins    Pain: 7/10 R hip pain  Lewis Shock, PT, DPT Pager #: [phone] Office #: [phone]

## 2011-11-29 NOTE — Progress Notes (Signed)
Utilization review completed. Ozie Lupe, RN, BSN. 

## 2011-11-29 NOTE — Progress Notes (Signed)
Patient with difficulty voiding overnight.  Fluids encouraged, position changed.  Patient bladder scanned at 0200, 15 mL appeared to be in bladder.  More fluids encouraged without spontaneous void.  Patient I+O cathed, resistance met, no urine emptied from bladder.  Patient encouraged to drink more fluids, with zero void produced. Bladder scanned again around 0500 to be .  Patient still without void.  Patient I+O cathed again, resistance met, zero void produced.  Foley cath inserted per protocol, 550 mL emptied from Foley at [phone].  Nursing will continue to monitor.

## 2011-11-29 NOTE — Evaluation (Signed)
Physical Therapy Evaluation Patient Details Name: Bradley Baldwin MRN: [id number] DOB: [birth year] Today's Date: 11/29/2011 Time: 0837-0900 PT Time Calculation (min): 23 min  PT Assessment / Plan / Recommendation Clinical Impression  Pt s/p R anterior hip replacement presenting with expected R LE weakness and decreased R hip ROM. Patient tolerated tx well. Antipate good progress and to be safe with d/c home with HHPT and assist of spouse 24/7.    PT Assessment  Patient needs continued PT services    Follow Up Recommendations  Home health PT    Does the patient have the potential to tolerate intense rehabilitation      Barriers to Discharge None      Equipment Recommendations   (pt has DME)    Recommendations for Other Services     Frequency 7X/week    Precautions / Restrictions Precautions Precautions: Anterior Hip Precaution Booklet Issued: Yes (comment) Precaution Comments: pt with verbal understanding Restrictions RLE Weight Bearing: Weight bearing as tolerated   Pertinent Vitals/Pain 6/10 R hip pain      Mobility  Bed Mobility Bed Mobility: Supine to Sit Supine to Sit: 4: Min assist;HOB flat Details for Bed Mobility Assistance: v/c's for technique, minA for R LE management Transfers Transfers: Sit to Stand;Stand to Sit Sit to Stand: 4: Min assist;With upper extremity assist;From bed Stand to Sit: 4: Min assist;With armrests;To chair/3-in-1 Details for Transfer Assistance: v/c's for hand placement and technique Ambulation/Gait Ambulation/Gait Assistance: 4: Min assist Ambulation Distance (Feet): 30 Feet Assistive device: Rolling Wehner Ambulation/Gait Assistance Details: v/c's for sequencing, increased UE support Gait Pattern: Step-to pattern;Decreased step length - right;Decreased stance time - right;Decreased stride length Gait velocity: slow Stairs: No    Shoulder Instructions     Exercises Total Joint Exercises Ankle Circles/Pumps: AROM;Both;10  reps Quad Sets: Right;10 reps;AROM Heel Slides: AROM;Right;10 reps   PT Diagnosis: Difficulty walking  PT Problem List: Decreased strength;Decreased range of motion;Decreased activity tolerance PT Treatment Interventions: Stair training;Gait training;Therapeutic exercise   PT Goals Acute Rehab PT Goals PT Goal Formulation: With patient Time For Goal Achievement: 12/06/11 Potential to Achieve Goals: Good Pt will go Supine/Side to Sit: with modified independence;with HOB 0 degrees PT Goal: Supine/Side to Sit - Progress: Goal set today Pt will go Sit to Stand: with supervision;with upper extremity assist (up to RW) PT Goal: Sit to Stand - Progress: Goal set today Pt will Ambulate: >150 feet;with supervision;with rolling Mckelvin PT Goal: Ambulate - Progress: Goal set today Pt will Go Up / Down Stairs: 3-5 stairs;with min assist;with rail(s) (or backwards with rolling [place]) PT Goal: Up/Down Stairs - Progress: Goal set today Pt will Perform Home Exercise Program: Independently PT Goal: Perform Home Exercise Program - Progress: Goal set today Additional Goals Additional Goal #1: Pt able to recall 4/4 hip precautions and be compliant. PT Goal: Additional Goal #1 - Progress: Goal set today  Visit Information  Last PT Received On: 11/29/11 Assistance Needed: +1    Subjective Data  Subjective: Pt received supine in bed with 5/10 R hip pain.   Prior Functioning  Home Living Lives With: Spouse Available Help at Discharge: Family;Available 24 hours/day Type of Home: House Home Access: Stairs to enter [employer] of Steps: 5 Entrance Stairs-Rails: Right Home Layout: One level Bathroom Shower/Tub: Engineer, manufacturing systems: Standard Bathroom Accessibility: Yes How Accessible: Accessible via [place] Home Adaptive Equipment: Bedside commode/3-in-1;[place] - rolling Prior Function Level of Independence: Independent Able to Take Stairs?: Yes Driving: Yes Vocation:  Unemployed Communication Communication:  No difficulties Dominant Hand: Right    Cognition  Overall Cognitive Status: Appears within functional limits for tasks assessed/performed Arousal/Alertness: Awake/alert Orientation Level: Appears intact for tasks assessed Behavior During Session: [facility] for tasks performed    Extremity/Trunk Assessment Right Upper Extremity Assessment RUE ROM/Strength/Tone: Within functional levels Left Upper Extremity Assessment LUE ROM/Strength/Tone: Within functional levels Right Lower Extremity Assessment RLE ROM/Strength/Tone:  (knee/ankle WFL) Left Lower Extremity Assessment LLE ROM/Strength/Tone: Within functional levels Trunk Assessment Trunk Assessment: Normal   Balance    End of Session PT - End of Session Equipment Utilized During Treatment: Gait belt Activity Tolerance: Patient tolerated treatment well Patient left: in chair;with call bell/phone within reach Nurse Communication: Mobility status  GP     Marcene Brawn 11/29/2011, 9:48 AM  Lewis Shock, PT, DPT Pager #: [phone] Office #: [phone]

## 2011-11-29 NOTE — Progress Notes (Addendum)
Subjective: 1 Day Post-Op Procedure(s) (LRB): TOTAL HIP ARTHROPLASTY ANTERIOR APPROACH (Right)  Activity level:  Can be out of bed with therapy weightbearing as tolerated no restrictions Diet tolerance:  Eating Voiding:  Difficulty voiding last evening and a catheter was put back in. We will remove it this morning and see if he can void better. Patient reports pain as 3 on 0-10 scale.    Objective: Vital signs in last 24 hours: Temp:  [97 F (36.1 C)-98.5 F (36.9 C)] 98.5 F (36.9 C) (10/30 0441) Pulse Rate:  [58-92] 92  (10/30 0441) Resp:  [10-18] 18  (10/30 0441) BP: (100-126)/(59-79) 107/72 mmHg (10/30 0441) SpO2:  [94 %-100 %] 98 % (10/30 0441)  Labs:  Basename 11/29/11 0653  HGB 11.3*    Basename 11/29/11 0653  WBC 15.4*  RBC 3.27*  HCT 32.1*  PLT 232    Basename 11/29/11 0653  NA 124*  K 5.0  CL 91*  CO2 25  BUN 16  CREATININE 1.31  GLUCOSE 183*  CALCIUM 8.3*   No results found for this basename: LABPT:2,INR:2 in the last 72 hours  Physical Exam:  Neurologically intact ABD soft Neurovascular intact Sensation intact distally Intact pulses distally Dorsiflexion/Plantar flexion intact No cellulitis present Compartment soft  Assessment/Plan:  1 Day Post-Op Procedure(s) (LRB): TOTAL HIP ARTHROPLASTY ANTERIOR APPROACH (Right) Advance diet Up with therapy D/C IV fluids Discharge home with home health tomorrow if he progresses well today. Will discontinue catheter and see if he can void better today Discontinue IV fluids to heparin lock Can be weightbearing as tolerated with therapy no restrictions. Continue ASA 325 one twice a day for 30 days for DVT prophylaxis Will repeat metabolic panel in the morning to check sodium    Oaklie Durrett R 11/29/2011, 8:09 AM

## 2011-11-30 ENCOUNTER — Encounter (HOSPITAL_COMMUNITY): Payer: Self-pay | Admitting: Orthopaedic Surgery

## 2011-11-30 LAB — CBC
HCT: 28.7 % — ABNORMAL LOW (ref 39.0–52.0)
Hemoglobin: 9.9 g/dL — ABNORMAL LOW (ref 13.0–17.0)
WBC: 15.8 10*3/uL — ABNORMAL HIGH (ref 4.0–10.5)

## 2011-11-30 LAB — BASIC METABOLIC PANEL
Chloride: 92 mEq/L — ABNORMAL LOW (ref 96–112)
GFR calc Af Amer: 81 mL/min — ABNORMAL LOW (ref >90)
Potassium: 4.7 mEq/L (ref 3.5–5.1)

## 2011-11-30 MED ORDER — METHOCARBAMOL 500 MG PO TABS: 500.0000 mg | ORAL_TABLET | Freq: Four times a day (QID) | ORAL | Status: DC | PRN

## 2011-11-30 MED ORDER — ASPIRIN 325 MG PO TBEC: 325.0000 mg | DELAYED_RELEASE_TABLET | Freq: Two times a day (BID) | ORAL | Status: DC

## 2011-11-30 MED ORDER — OXYCODONE HCL 5 MG PO TABS: 5.0000 mg | ORAL_TABLET | ORAL | Status: DC | PRN

## 2011-11-30 NOTE — Progress Notes (Signed)
Occupational Therapy Discharge Patient Details Name: Bradley Baldwin MRN: [id number] DOB: 09-11-1950 Today's Date: 11/30/2011 Time: 1191-4782 OT Time Calculation (min): 16 min  Patient discharged from OT services secondary to goals met and no further OT needs identified.  Please see latest therapy progress note for current level of functioning and progress toward goals.    Progress and discharge plan discussed with patient and/or caregiver: Patient/Caregiver agrees with plan  GO     [name] 11/30/2011, 2:55 PM

## 2011-11-30 NOTE — Progress Notes (Signed)
Subjective: 2 Days Post-Op Procedure(s) (LRB): TOTAL HIP ARTHROPLASTY ANTERIOR APPROACH (Right)  Activity level:  walking Diet tolerance:  ok Voiding:  ok Patient reports pain as 4 on 0-10 scale.    Objective: Vital signs in last 24 hours: Temp:  [97.6 F (36.4 C)-100 F (37.8 C)] 97.6 F (36.4 C) (10/31 0653) Pulse Rate:  [74-89] 80  (10/31 1016) Resp:  [18] 18  (10/31 0653) BP: (117-136)/(64-78) 117/64 mmHg (10/31 1016) SpO2:  [98 %-99 %] 99 % (10/31 0653)  Labs:  Basename 11/30/11 0540 11/29/11 0653  HGB 9.9* 11.3*    Basename 11/30/11 0540 11/29/11 0653  WBC 15.8* 15.4*  RBC 2.99* 3.27*  HCT 28.7* 32.1*  PLT 186 232    Basename 11/30/11 0540 11/29/11 0653  NA 127* 124*  K 4.7 5.0  CL 92* 91*  CO2 28 25  BUN 15 16  CREATININE 1.11 1.31  GLUCOSE 192* 183*  CALCIUM 10.3 8.3*   No results found for this basename: LABPT:2,INR:2 in the last 72 hours  Physical Exam:  Neurologically intact ABD soft Neurovascular intact Sensation intact distally Intact pulses distally Dorsiflexion/Plantar flexion intact No cellulitis present Compartment soft  Assessment/Plan:  2 Days Post-Op Procedure(s) (LRB): TOTAL HIP ARTHROPLASTY ANTERIOR APPROACH (Right) Advance diet Up with therapy D/C IV fluids Plan for discharge tomorrow    Bradley Baldwin R 11/30/2011, 12:04 PM

## 2011-11-30 NOTE — Progress Notes (Signed)
Physical Therapy Treatment Patient Details Name: Bradley Baldwin MRN: [id number] DOB: 05-Feb-1950 Today's Date: 11/30/2011 Time: 7322-0254 PT Time Calculation (min): 37 min  PT Assessment / Plan / Recommendation Comments on Treatment Session  pt continues to make good progress and should d/c home tomorrow.     Follow Up Recommendations  Home health PT     Does the patient have the potential to tolerate intense rehabilitation     Barriers to Discharge        Equipment Recommendations  None recommended by PT    Recommendations for Other Services    Frequency 7X/week   Plan Discharge plan remains appropriate    Precautions / Restrictions Precautions Precautions: Anterior Hip Restrictions Weight Bearing Restrictions: Yes RLE Weight Bearing: Weight bearing as tolerated   Pertinent Vitals/Pain Pt reporting pain in hip 3/10.  No intervention required.     Mobility  Bed Mobility Bed Mobility: Not assessed Transfers Transfers: Sit to Stand;Stand to Sit Sit to Stand: 6: Modified independent (Device/Increase time) Stand to Sit: 6: Modified independent (Device/Increase time) Ambulation/Gait Ambulation/Gait Assistance: 5: Supervision Ambulation Distance (Feet): 200 Feet Assistive device: Rolling Manrique Ambulation/Gait Assistance Details: no instruction required.  Gait Pattern: Step-to pattern;Decreased step length - right;Decreased stance time - right;Decreased stride length;Step-through pattern Stairs: Yes Stairs Assistance: 5: Supervision Stairs Assistance Details (indicate cue type and reason): Instructed pt to negotiate 3 steps forward with one rail and 3 steps backward with 1 rail. Pt preferred the backward method and appeared to be much safer with that technique.  Stair Management Technique: One rail Right;Backwards;Forwards;Step to pattern Number of Stairs: 3  Wheelchair Mobility Wheelchair Mobility: No    Exercises Total Joint Exercises Ankle Circles/Pumps:  AROM;Both;10 reps Hip ABduction/ADduction: 10 reps;AROM;Right Marching in Standing: AROM;10 reps;Right;Standing   PT Diagnosis:    PT Problem List:   PT Treatment Interventions:     PT Goals Acute Rehab PT Goals PT Goal Formulation: With patient Time For Goal Achievement: 12/06/11 Potential to Achieve Goals: Good Pt will go Supine/Side to Sit: with modified independence;with HOB 0 degrees PT Goal: Supine/Side to Sit - Progress: Progressing toward goal Pt will go Sit to Stand: with supervision;with upper extremity assist Pt will Ambulate: >150 feet;with supervision;with rolling Tech PT Goal: Ambulate - Progress: Met Pt will Go Up / Down Stairs: 3-5 stairs;with min assist;with rail(s) PT Goal: Up/Down Stairs - Progress: Met Pt will Perform Home Exercise Program: Independently Additional Goals Additional Goal #1: Pt able to recall 4/4 hip precautions and be compliant. PT Goal: Additional Goal #1 - Progress: Progressing toward goal  Visit Information  Last PT Received On: 11/30/11 Assistance Needed: +1    Subjective Data      Cognition  Overall Cognitive Status: Appears within functional limits for tasks assessed/performed Arousal/Alertness: Awake/alert Orientation Level: Appears intact for tasks assessed Behavior During Session: [facility] for tasks performed    Balance  Balance Balance Assessed: No  End of Session PT - End of Session Equipment Utilized During Treatment: Gait belt Activity Tolerance: Patient tolerated treatment well Patient left: in chair;with call bell/phone within reach Nurse Communication: Mobility status   GP     Maliya Marich 11/30/2011, 4:54 PM Susanne Baumgarner L. Mackenze Grandison DPT [phone]

## 2011-11-30 NOTE — Progress Notes (Signed)
I agree with the following treatment note after reviewing documentation.   Johnston, Maia Handa Brynn   OTR/L Pager: [phone] Office: [phone] .   

## 2011-11-30 NOTE — Progress Notes (Signed)
I agree with the following treatment note after reviewing documentation.   Johnston, Geary Rufo Brynn   OTR/L Pager: [phone] Office: [phone] .   

## 2011-11-30 NOTE — Progress Notes (Signed)
Occupational Therapy Treatment Patient Details Name: Bradley Baldwin MRN: [id number] DOB: April 13, 1950 Today's Date: 11/30/2011 Time: 5621-3086 OT Time Calculation (min): 16 min  OT Assessment / Plan / Recommendation Comments on Treatment Session Pt met all OT goals. Performs BADL's at supervision level and is safe to d/c home with HHOT     Follow Up Recommendations  Home health OT;Supervision - Intermittent    Barriers to Discharge       Equipment Recommendations  None recommended by OT    Recommendations for Other Services    Frequency Min 2X/week   Plan All goals met and education completed, patient discharged from OT services    Precautions / Restrictions Precautions Precautions: Anterior Hip Restrictions Weight Bearing Restrictions: Yes RLE Weight Bearing: Weight bearing as tolerated   Pertinent Vitals/Pain Minimal pain, pt could not give a numeric score.     ADL  Lower Body Bathing: Simulated;Supervision/safety Where Assessed - Lower Body Bathing: Supported sit to stand Lower Body Dressing: Performed;Supervision/safety Where Assessed - Lower Body Dressing: Supported sit to Pharmacist, hospital: Buyer, retail Method: Sit to Barista: Raised toilet seat with arms (or 3-in-1 over toilet) Tub/Shower Transfer: Engineer, manufacturing Method: Science writer: Other (comment) (3n1) Equipment Used: Gait belt;Rolling Rude Transfers/Ambulation Related to ADLs: Performed transfrs and ambulation at supervision level for safety  ADL Comments: Educated pt on hip kit to purchase in hospital gift shop. Performed LB dressing with supervision using reacher to (A) with pulling pants over feet. Rolled pt down to ortho gym in recliner to practice tub transfer using 3n1 that pt has at home. Pt able to complete tub transfer while adhering to hip precautions at supervision level with min  vc's for sequencing    OT Diagnosis:    OT Problem List:   OT Treatment Interventions:     OT Goals Acute Rehab OT Goals OT Goal Formulation: With patient Time For Goal Achievement: 12/13/11 Potential to Achieve Goals: Good ADL Goals Pt Will Perform Lower Body Dressing: with supervision;Sit to stand from chair ADL Goal: Lower Body Dressing - Progress: Met Pt Will Perform Tub/Shower Transfer: with supervision;Maintaining hip precautions;Other (comment) ADL Goal: Tub/Shower Transfer - Progress: Met Miscellaneous OT Goals Miscellaneous OT Goal #1: Pt will adhere to 100% of hip precautions throughout ADL's prior to d/c OT Goal: Miscellaneous Goal #1 - Progress: Met  Visit Information  Last OT Received On: 11/30/11 Assistance Needed: +1    Subjective Data      Prior Functioning       Cognition  Overall Cognitive Status: Appears within functional limits for tasks assessed/performed Arousal/Alertness: Awake/alert Orientation Level: Appears intact for tasks assessed Behavior During Session: [facility] for tasks performed    Mobility  Shoulder Instructions Bed Mobility Bed Mobility: Not assessed Transfers Transfers: Sit to Stand;Stand to Sit Sit to Stand: 5: Supervision;With upper extremity assist;From chair/3-in-1;With armrests Stand to Sit: 5: Supervision;With upper extremity assist;With armrests;To chair/3-in-1                 End of Session OT - End of Session Equipment Utilized During Treatment: Gait belt Activity Tolerance: Patient tolerated treatment well Patient left: in chair;Other (comment) (with PT taking pt to ortho gym ) Nurse Communication: Mobility status  GO     [name] 11/30/2011, 2:55 PM

## 2011-12-01 NOTE — Progress Notes (Signed)
Physical Therapy Treatment Patient Details Name: Bradley Baldwin MRN: [id number] DOB: February 03, 1950 Today's Date: 12/01/2011 Time: 6962-9528 PT Time Calculation (min): 26 min  PT Assessment / Plan / Recommendation Comments on Treatment Session  All acute PT goals met     Follow Up Recommendations  Home health PT     Does the patient have the potential to tolerate intense rehabilitation     Barriers to Discharge        Equipment Recommendations  None recommended by PT    Recommendations for Other Services    Frequency 7X/week   Plan Discharge plan remains appropriate    Precautions / Restrictions Precautions Precautions: Anterior Hip Precaution Booklet Issued: No Precaution Comments: able to recall 4/4 Restrictions Weight Bearing Restrictions: Yes   Pertinent Vitals/Pain Pt reporting pain in knee is 6/10.      Mobility  Bed Mobility Bed Mobility: Supine to Sit;Sit to Supine Supine to Sit: 7: Independent Sit to Supine: 7: Independent Transfers Transfers: Sit to Stand;Stand to Sit Sit to Stand: 6: Modified independent (Device/Increase time);From bed;From chair/3-in-1;With upper extremity assist Stand to Sit: 6: Modified independent (Device/Increase time);To bed;To chair/3-in-1;With upper extremity assist Details for Transfer Assistance: No instruction required Ambulation/Gait Ambulation/Gait Assistance: 6: Modified independent (Device/Increase time) Ambulation Distance (Feet): 200 Feet Assistive device: Rolling [place] Gait Pattern: Step-to pattern;Decreased step length - right;Decreased stance time - right;Decreased stride length;Step-through pattern Gait velocity: slow Wheelchair Mobility Wheelchair Mobility: No    Exercises Total Joint Exercises Ankle Circles/Pumps: AROM;Both;10 reps Quad Sets: Right;10 reps;AROM Gluteal Sets: 10 reps;Both;Seated Short Arc Quad: 10 reps Heel Slides: AROM;Right;10 reps Hip ABduction/ADduction: 10 reps;AROM;Right Marching in  Standing: AROM;10 reps;Right;Standing   PT Diagnosis:    PT Problem List:   PT Treatment Interventions:     PT Goals Acute Rehab PT Goals PT Goal Formulation: With patient Time For Goal Achievement: 12/06/11 Potential to Achieve Goals: Good Pt will go Supine/Side to Sit: with modified independence;with HOB 0 degrees PT Goal: Supine/Side to Sit - Progress: Met Pt will go Sit to Stand: with supervision;with upper extremity assist PT Goal: Sit to Stand - Progress: Met Pt will Ambulate: >150 feet;with supervision;with rolling Aube PT Goal: Ambulate - Progress: Met Pt will Go Up / Down Stairs: 3-5 stairs;with min assist;with rail(s) PT Goal: Up/Down Stairs - Progress: Met Pt will Perform Home Exercise Program: Independently PT Goal: Perform Home Exercise Program - Progress: Met Additional Goals Additional Goal #1: Pt able to recall 4/4 hip precautions and be compliant. PT Goal: Additional Goal #1 - Progress: Met  Visit Information  Last PT Received On: 12/01/11    Subjective Data      Cognition  Overall Cognitive Status: Appears within functional limits for tasks assessed/performed Arousal/Alertness: Awake/alert Orientation Level: Appears intact for tasks assessed Behavior During Session: [facility] for tasks performed    Balance  Balance Balance Assessed: No  End of Session PT - End of Session Equipment Utilized During Treatment: Gait belt Activity Tolerance: Patient tolerated treatment well Patient left: in chair;with call bell/phone within reach Nurse Communication: Mobility status   GP     Ellsie Violette 12/01/2011, 2:37 PM Hiyab Nhem L. Amr [place] DPT [phone]

## 2011-12-01 NOTE — Progress Notes (Signed)
vss  Stable No change  - doing well  D/C home

## 2011-12-01 NOTE — Discharge Summary (Signed)
Patient ID: Bradley Baldwin MRN: [id number] DOB/AGE: 61-Apr-1952 61 y.o.  Admit date: 11/28/2011 Discharge date: 12/01/2011  Admission Diagnoses:  Principal Problem:  *DJD (degenerative joint disease) of hip   Discharge Diagnoses:  Same  Past Medical History  Diagnosis Date  . Hypertension     pcp   dr pickerd   [place]  . GERD (gastroesophageal reflux disease)   . Arthritis     "starting to get achy joints here and there" (11/28/2011)  . External bleeding hemorrhoids     "occasionally" (11/28/2011)    Surgeries: Procedure(s): TOTAL HIP ARTHROPLASTY ANTERIOR APPROACH on 11/28/2011   Consultants:    Discharged Condition: Improved  Hospital Course: Bradley Baldwin is an 61 y.o. male who was admitted 11/28/2011 for operative treatment ofDJD (degenerative joint disease) of hip. Patient has severe unremitting pain that affects sleep, daily activities, and work/hobbies. After pre-op clearance the patient was taken to the operating room on 11/28/2011 and underwent  Procedure(s): TOTAL HIP ARTHROPLASTY ANTERIOR APPROACH.    Patient was given perioperative antibiotics: Anti-infectives     Start     Dose/Rate Route Frequency Ordered Stop   11/28/11 1800   ceFAZolin (ANCEF) IVPB 2 g/50 mL premix        2 g 100 mL/hr over 30 Minutes Intravenous 4 times per day 11/28/11 1608 11/30/2011 0042   11/27/11 1501   ceFAZolin (ANCEF) IVPB 2 g/50 mL premix        2 g 100 mL/hr over 30 Minutes Intravenous 60 min pre-op 11/27/11 1501 11/28/11 0954           Patient was given sequential compression devices, early ambulation, and chemoprophylaxis to prevent DVT.  Patient benefited maximally from hospital stay and there were no complications.    Recent vital signs: Patient Vitals for the past 24 hrs:  BP Temp Temp src Pulse Resp SpO2  12/01/11 0525 103/73 mmHg 98.7 F (37.1 C) - 78  18  98 %  11/30/11 2131 105/55 mmHg 98.6 F (37 C) - 92  18  97 %  11/30/11 1400 127/75 mmHg 98.7 F  (37.1 C) Oral 84  18  100 %  11/30/11 1016 117/64 mmHg - - 80  - -     Recent laboratory studies:  Basename 11/30/11 0540 30-Nov-2011 0653  WBC 15.8* 15.4*  HGB 9.9* 11.3*  HCT 28.7* 32.1*  PLT 186 232  NA 127* 124*  K 4.7 5.0  CL 92* 91*  CO2 28 25  BUN 15 16  CREATININE 1.11 1.31  GLUCOSE 192* 183*  INR -- --  CALCIUM 10.3 --     Discharge Medications:     Medication List     As of 12/01/2011  8:18 AM    TAKE these medications         aspirin 325 MG EC tablet   Take 1 tablet (325 mg total) by mouth 2 (two) times daily.      atenolol 50 MG tablet   Commonly known as: TENORMIN   Take 50 mg by mouth daily.      hydrochlorothiazide 12.5 MG capsule   Commonly known as: MICROZIDE   Take 12.5 mg by mouth daily.      lisinopril 40 MG tablet   Commonly known as: PRINIVIL,ZESTRIL   Take 40 mg by mouth daily.      methocarbamol 500 MG tablet   Commonly known as: ROBAXIN   Take 1 tablet (500 mg total) by mouth every 6 (six) hours  as needed.      oxyCODONE 5 MG immediate release tablet   Commonly known as: Oxy IR/ROXICODONE   Take 1-2 tablets (5-10 mg total) by mouth every 3 (three) hours as needed.        Diagnostic Studies: Dg Chest 2 View  11/21/2011  *RADIOLOGY REPORT*  Clinical Data: Right hip replacement, hypertension  CHEST - 2 VIEW  Comparison: None.  Findings: Heart size and vascular pattern are normal.  Lungs are clear.  IMPRESSION: negative study.   Original Report Authenticated By: Otilio Carpen, M.D.    Dg Hip Operative Right  11/28/2011  *RADIOLOGY REPORT*  Clinical Data: Right hip replacement.  DG OPERATIVE RIGHT HIP  Comparison: None.  Findings: Fluoroscopic images of the right hip were obtained. Degenerative changes are noted in the right hip prior to the replacement.  A total right hip replacement was performed.  No evidence for complicating features.  IMPRESSION: Total right hip replacement without complicating features.   Original Report  Authenticated By: Richarda Overlie, M.D.    US Abdomen Complete  11/02/2011  *RADIOLOGY REPORT*  Clinical Data:  Elevated liver enzymes  ABDOMINAL ULTRASOUND COMPLETE  Comparison:  None.  Findings:  Gallbladder:  No gallstones, gallbladder wall thickening, or pericholecystic fluid.  Per the sonographer, the sonographic Murphy's sign was negative.  Common Bile Duct:  Within normal limits in caliber at 3.6 mm.  Liver: No focal mass lesion identified.  The hepatic parenchyma is diffusely echogenic and coarsened consistent with fatty infiltration.  IVC:  Appears normal.  Pancreas:  Limited visualization secondary to obscuration by overlying bowel gas.  No abnormality identified.  Spleen:  Within normal limits in size and echotexture.  Spleen measures 5.8 cm in length.  Right kidney:  Normal in size and parenchymal echogenicity.  No evidence of solid mass or hydronephrosis. Right upper pole exophytic, anechoic cystic structure with posterior acoustic enhancement and an imperceptible wall measures 5.8 x 6.3 x 5.8 cm. Sonographically, this is consistent with a simple cyst.  The right kidney measures 12 cm in length.  Left kidney:  Normal in size and parenchymal echogenicity.  No evidence of solid mass or hydronephrosis. A small well-defined anechoic structure with posterior acoustic enhancement and imperceptible wall consistent with a simple cyst exophytic from the lower pole left kidney measures 1.0 x 1.4 x 1.2 cm.  The left kidney measures 12.1 cm in length.  Abdominal Aorta:  Within normal limits.  No aneurysm identified. The upper abdominal aorta measures up to 2.9 cm in diameter.  IMPRESSION:  1.  Diffuse hepatic steatosis. 2.  Bilateral simple renal cysts.   Original Report Authenticated By: Vilma Prader     Disposition: Final discharge disposition not confirmed      Discharge Orders    Future Orders Please Complete By Expires   Diet - low sodium heart healthy      Call MD / Call 911      Comments:   If you  experience chest pain or shortness of breath, CALL 911 and be transported to the hospital emergency room.  If you develope a fever above 101 F, pus (white drainage) or increased drainage or redness at the wound, or calf pain, call your surgeon's office.   Constipation Prevention      Comments:   Drink plenty of fluids.  Prune juice may be helpful.  You may use a stool softener, such as Colace (over the counter) 100 mg twice a day.  Use MiraLax (over the counter)  for constipation as needed.   Increase activity slowly as tolerated         Follow-up Information    Follow up with Velna Ochs, MD. Call in 2 weeks.   Contact information:   [address] [place] [address] [phone]           Signed: Prince Rome 12/01/2011, 8:18 AM

## 2012-07-17 ENCOUNTER — Telehealth: Payer: Self-pay | Admitting: Family Medicine

## 2012-07-17 MED ORDER — HYDROCHLOROTHIAZIDE 12.5 MG PO CAPS: 12.5000 mg | ORAL_CAPSULE | Freq: Every day | ORAL | Status: DC

## 2012-07-17 NOTE — Telephone Encounter (Signed)
Rx Refilled  

## 2012-08-22 ENCOUNTER — Encounter: Payer: Self-pay | Admitting: Physician Assistant

## 2012-08-22 ENCOUNTER — Ambulatory Visit (INDEPENDENT_AMBULATORY_CARE_PROVIDER_SITE_OTHER): Payer: BC Managed Care – PPO | Admitting: Physician Assistant

## 2012-08-22 VITALS — BP 134/80 | HR 60 | Temp 99.0°F | Resp 18 | Ht 63.5 in | Wt 177.0 lb

## 2012-08-22 DIAGNOSIS — B9689 Other specified bacterial agents as the cause of diseases classified elsewhere: Secondary | ICD-10-CM

## 2012-08-22 DIAGNOSIS — A499 Bacterial infection, unspecified: Secondary | ICD-10-CM

## 2012-08-22 DIAGNOSIS — J988 Other specified respiratory disorders: Secondary | ICD-10-CM

## 2012-08-22 MED ORDER — FLUTICASONE PROPIONATE 50 MCG/ACT NA SUSP: 2.0000 | Freq: Every day | NASAL | Status: AC

## 2012-08-22 MED ORDER — AZITHROMYCIN 250 MG PO TABS: ORAL_TABLET | ORAL | Status: DC

## 2012-08-23 NOTE — Progress Notes (Signed)
   Patient ID: EVEN BUDLONG MRN: [id number], DOB: Jan 13, 1951, 62 y.o. Date of Encounter: 08/23/2012, 5:52 AM    Chief Complaint:  Chief Complaint  Patient presents with  . bad cold x 1 week  very harsh cough     HPI: 62 y.o. year old white male reprts that for > 1 week he had a lot of drainage from nose down throat. Now, very congested in chest with thick dark phlegm. Now also blowing thick dark mucus from nose. No sore throat, ear pain, fever/chills.  Home Meds: See attached medication section for any medications that were entered at today's visit. The computer does not put those onto this list.The following list is a list of meds entered prior to today's visit.   Current Outpatient Prescriptions on File Prior to Visit  Medication Sig Dispense Refill  . atenolol (TENORMIN) 50 MG tablet Take 50 mg by mouth daily.      . hydrochlorothiazide (MICROZIDE) 12.5 MG capsule Take 1 capsule (12.5 mg total) by mouth daily.  90 capsule  1  . lisinopril (PRINIVIL,ZESTRIL) 40 MG tablet Take 40 mg by mouth daily.       No current facility-administered medications on file prior to visit.    Allergies: No Known Allergies    Review of Systems: See HPI for pertinent ROS. All other ROS negative.    Physical Exam: Blood pressure 134/80, pulse 60, temperature 99 F (37.2 C), temperature source Oral, resp. rate 18, height 5' 3.5" (1.613 m), weight 177 lb (80.287 kg)., Body mass index is 30.86 kg/(m^2). General:WNWD WM  Appears in no acute distress. HEENT: Normocephalic, atraumatic, eyes without discharge, sclera non-icteric, nares are without discharge. Bilateral auditory canals clear, TM's are without perforation, pearly grey and translucent with reflective cone of light bilaterally. Oral cavity moist, posterior pharynx without exudate, erythema, peritonsillar abscess. No sinus tenderness with percussion.   Neck: Supple. No thyromegaly. No lymphadenopathy. Lungs: Clear bilaterally to auscultation  without wheezes, rales, or rhonchi. Breathing is unlabored. Heart: Regular rhythm. No murmurs, rubs, or gallops. Msk:  Strength and tone normal for age. Extremities/Skin: Warm and dry. No rashes or suspicious lesions. Neuro: Alert and oriented X 3. Moves all extremities spontaneously. Gait is normal. CNII-XII grossly in tact. Psych:  Responds to questions appropriately with a normal affect.     ASSESSMENT AND PLAN:  62 y.o. year old male with  1. Bacterial respiratory infection - azithromycin (ZITHROMAX) 250 MG tablet; Day 1: Take 2 daily.   Days 2-5: Take one daily.  Dispense: 6 tablet; Refill: 0 - fluticasone (FLONASE) 50 MCG/ACT nasal spray; Place 2 sprays into the nose daily.  Dispense: 16 g; Refill: 6 F/U if does not resolve.  Signed, [address] [place], [place], [facility] 08/23/2012 5:52 AM

## 2012-10-22 ENCOUNTER — Telehealth: Payer: Self-pay | Admitting: Family Medicine

## 2012-10-22 MED ORDER — ATENOLOL 50 MG PO TABS: 50.0000 mg | ORAL_TABLET | Freq: Every day | ORAL | Status: DC

## 2012-10-22 MED ORDER — LISINOPRIL 40 MG PO TABS: 40.0000 mg | ORAL_TABLET | Freq: Every day | ORAL | Status: DC

## 2012-10-22 NOTE — Telephone Encounter (Addendum)
Lisinopril 40 mg tab 1 QD #90 Atenolol 50 mg tab 1 QD #90

## 2012-10-22 NOTE — Telephone Encounter (Signed)
Rx Refilled  

## 2012-12-04 ENCOUNTER — Telehealth: Payer: Self-pay | Admitting: Family Medicine

## 2012-12-04 DIAGNOSIS — J988 Other specified respiratory disorders: Secondary | ICD-10-CM

## 2012-12-04 MED ORDER — AZITHROMYCIN 250 MG PO TABS: ORAL_TABLET | ORAL | Status: DC

## 2012-12-04 NOTE — Telephone Encounter (Signed)
Pt states that he is having the same symptoms as she did before with URI and was wondering (since we have no appts) if we could call her in something for it. He is having the cough that keeps him up at night and sinus congestion and drainage. No fever.

## 2012-12-04 NOTE — Telephone Encounter (Signed)
Okay send Z pak  Mucinex Continue flonase

## 2012-12-04 NOTE — Telephone Encounter (Signed)
Med sent to pharm and Patient aware  

## 2012-12-05 ENCOUNTER — Other Ambulatory Visit: Payer: Self-pay

## 2013-01-27 ENCOUNTER — Other Ambulatory Visit: Payer: Self-pay | Admitting: Family Medicine

## 2013-01-27 ENCOUNTER — Ambulatory Visit (INDEPENDENT_AMBULATORY_CARE_PROVIDER_SITE_OTHER): Payer: BC Managed Care – PPO | Admitting: Family Medicine

## 2013-01-27 VITALS — BP 156/90

## 2013-01-27 DIAGNOSIS — Z23 Encounter for immunization: Secondary | ICD-10-CM

## 2013-01-27 MED ORDER — HYDROCHLOROTHIAZIDE 12.5 MG PO CAPS: 12.5000 mg | ORAL_CAPSULE | Freq: Every day | ORAL | Status: DC

## 2013-01-27 MED ORDER — ATENOLOL 50 MG PO TABS: 50.0000 mg | ORAL_TABLET | Freq: Every day | ORAL | Status: DC

## 2013-01-27 MED ORDER — LISINOPRIL 40 MG PO TABS: 40.0000 mg | ORAL_TABLET | Freq: Every day | ORAL | Status: DC

## 2013-01-27 NOTE — Telephone Encounter (Signed)
BP elevated.  Due for OV with provider.  Made appt for tomorrow.  Sent one month refills of meds so he won't run out.

## 2013-02-21 ENCOUNTER — Other Ambulatory Visit: Payer: Self-pay | Admitting: Family Medicine

## 2013-02-21 ENCOUNTER — Other Ambulatory Visit: Payer: BC Managed Care – PPO

## 2013-02-21 DIAGNOSIS — Z0000 Encounter for general adult medical examination without abnormal findings: Secondary | ICD-10-CM

## 2013-02-21 LAB — COMPREHENSIVE METABOLIC PANEL
ALK PHOS: 60 U/L (ref 39–117)
ALT: 59 U/L — ABNORMAL HIGH (ref 0–53)
AST: 46 U/L — AB (ref 0–37)
Albumin: 4.3 g/dL (ref 3.5–5.2)
BILIRUBIN TOTAL: 0.7 mg/dL (ref 0.3–1.2)
BUN: 18 mg/dL (ref 6–23)
CO2: 24 mEq/L (ref 19–32)
CREATININE: 1 mg/dL (ref 0.50–1.35)
Calcium: 9.5 mg/dL (ref 8.4–10.5)
Chloride: 99 mEq/L (ref 96–112)
GLUCOSE: 127 mg/dL — AB (ref 70–99)
POTASSIUM: 4.9 meq/L (ref 3.5–5.3)
Sodium: 133 mEq/L — ABNORMAL LOW (ref 135–145)
Total Protein: 6.9 g/dL (ref 6.0–8.3)

## 2013-02-21 LAB — PSA: PSA: 0.14 ng/mL (ref <=4.00)

## 2013-02-21 LAB — CBC WITH DIFFERENTIAL/PLATELET
BASOS ABS: 0.1 10*3/uL (ref 0.0–0.1)
BASOS PCT: 1 % (ref 0–1)
EOS ABS: 0.2 10*3/uL (ref 0.0–0.7)
Eosinophils Relative: 2 % (ref 0–5)
HEMATOCRIT: 43.7 % (ref 39.0–52.0)
Hemoglobin: 15.8 g/dL (ref 13.0–17.0)
Lymphocytes Relative: 46 % (ref 12–46)
Lymphs Abs: 3.4 10*3/uL (ref 0.7–4.0)
MCH: 34.3 pg — AB (ref 26.0–34.0)
MCHC: 36.2 g/dL — AB (ref 30.0–36.0)
MCV: 95 fL (ref 78.0–100.0)
MONO ABS: 0.7 10*3/uL (ref 0.1–1.0)
Monocytes Relative: 9 % (ref 3–12)
NEUTROS ABS: 3 10*3/uL (ref 1.7–7.7)
Neutrophils Relative %: 42 % — ABNORMAL LOW (ref 43–77)
Platelets: 236 10*3/uL (ref 150–400)
RBC: 4.6 MIL/uL (ref 4.22–5.81)
RDW: 12.5 % (ref 11.5–15.5)
WBC: 7.3 10*3/uL (ref 4.0–10.5)

## 2013-02-21 LAB — LIPID PANEL
CHOL/HDL RATIO: 3.6 ratio
Cholesterol: 158 mg/dL (ref 0–200)
HDL: 44 mg/dL (ref >39)
LDL CALC: 98 mg/dL (ref 0–99)
TRIGLYCERIDES: 81 mg/dL (ref <150)
VLDL: 16 mg/dL (ref 0–40)

## 2013-02-26 LAB — HEMOGLOBIN A1C
Hgb A1c MFr Bld: 6.4 % — ABNORMAL HIGH (ref <5.7)
Mean Plasma Glucose: 137 mg/dL — ABNORMAL HIGH (ref <117)

## 2013-02-28 ENCOUNTER — Ambulatory Visit (INDEPENDENT_AMBULATORY_CARE_PROVIDER_SITE_OTHER): Payer: BC Managed Care – PPO | Admitting: Family Medicine

## 2013-02-28 ENCOUNTER — Encounter: Payer: Self-pay | Admitting: Family Medicine

## 2013-02-28 VITALS — BP 120/68 | HR 60 | Temp 97.3°F | Resp 14 | Ht 64.0 in | Wt 177.0 lb

## 2013-02-28 DIAGNOSIS — K760 Fatty (change of) liver, not elsewhere classified: Secondary | ICD-10-CM | POA: Insufficient documentation

## 2013-02-28 DIAGNOSIS — K635 Polyp of colon: Secondary | ICD-10-CM | POA: Insufficient documentation

## 2013-02-28 DIAGNOSIS — Z0000 Encounter for general adult medical examination without abnormal findings: Secondary | ICD-10-CM

## 2013-02-28 DIAGNOSIS — R7303 Prediabetes: Secondary | ICD-10-CM | POA: Insufficient documentation

## 2013-02-28 DIAGNOSIS — Z23 Encounter for immunization: Secondary | ICD-10-CM

## 2013-02-28 DIAGNOSIS — Z0184 Encounter for antibody response examination: Secondary | ICD-10-CM

## 2013-02-28 MED ORDER — ATENOLOL 50 MG PO TABS: 50.0000 mg | ORAL_TABLET | Freq: Every day | ORAL | Status: DC

## 2013-02-28 MED ORDER — LISINOPRIL 40 MG PO TABS: 40.0000 mg | ORAL_TABLET | Freq: Every day | ORAL | Status: DC

## 2013-02-28 MED ORDER — HYDROCHLOROTHIAZIDE 12.5 MG PO CAPS: 12.5000 mg | ORAL_CAPSULE | Freq: Every day | ORAL | Status: DC

## 2013-02-28 NOTE — Addendum Note (Signed)
Addended by: Shary Decamp B on: 02/28/2013 04:56 PM   Modules accepted: Orders

## 2013-02-28 NOTE — Progress Notes (Signed)
Subjective:    Patient ID: Bradley Baldwin, male    DOB: [birth year], 63 y.o.   MRN: [id number]  HPI Patient is a very pleasant 62 year old white male with a history of hypertension, nonalcoholic fatty liver disease, and prediabetes. He is here today for complete physical exam. He had TdaP in 2011. His last colonoscopy was in 2009 but was significant for colon polyps. He is due for a repeat colonoscopy. He has had his flu shot. He requests another tetanus vaccine today because he is having a grandson.  He realizes that he is already had a tetanus vaccine but he would like to proceed with anyway. He is also requesting blood work to check his measles titers. He remembers having measles as a child he is not sure if he had [place] measles her red measles. He is not having measles vaccine. He is concerned about acquiring measles and possibly giving it to his grandson. His measles titers are negative he would like to receive the MMR vaccine as well. Otherwise he has no complaints. His hemoglobin A1c is elevated at 6.4. His weight is stable at 177 pounds. The patient freely admits he is not engaging in regular aerobic exercise. He also has mild elevations in his liver function tests which are actually better than they have been previously. He's had a viral hepatitis panel which is negative and he has had a right upper quadrant ultrasound which confirms steatohepatitis.   Past Medical History  Diagnosis Date  . Hypertension     pcp   dr pickerd   [place]  . GERD (gastroesophageal reflux disease)   . Arthritis     "starting to get achy joints here and there" (11/28/2011)  . External bleeding hemorrhoids     "occasionally" (11/28/2011)  . Fatty liver disease, nonalcoholic   . Prediabetes   . Colon polyps    Past Surgical History  Procedure Laterality Date  . 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 Total hip arthroplasty  11/28/2011    Procedure: TOTAL HIP ARTHROPLASTY ANTERIOR APPROACH;   Surgeon: Hessie Dibble, MD;  Location: [facility];  Service: Orthopedics;  Laterality: Right;   Current Outpatient Prescriptions on File Prior to Visit  Medication Sig Dispense Refill  . aspirin EC 81 MG tablet Take 81 mg by mouth daily.      . fluticasone (FLONASE) 50 MCG/ACT nasal spray Place 2 sprays into the nose daily.  16 g  6   No current facility-administered medications on file prior to visit.   No Known Allergies History   Social History  . Marital Status: Married    Spouse Name: N/A    Number of Children: N/A  . Years of Education: N/A   Occupational History  . Not on file.   Social History Main Topics  . Smoking status: Former Smoker -- 2.00 packs/day for 10 years    Types: Cigarettes  . Smokeless tobacco: Former Systems developer    Types: Chew     Comment: 11/28/2011 "quit smoking ~ 30 yr ago; chewed some then quit that"  . Alcohol Use: 3.6 oz/week    6 Cans of beer per week     Comment: 11/28/2011 "drink a few beers 3 times/wk; ~ 6pk total/wk"  . Drug Use: No  . Sexual Activity: Yes   Other Topics Concern  . Not on file   Social History Narrative  . No narrative on file      Review of Systems  All other systems reviewed and are negative.       Objective:   Physical Exam  Vitals reviewed. Constitutional: He is oriented to person, place, and time. He appears well-developed and well-nourished. No distress.  HENT:  Head: Normocephalic and atraumatic.  Right Ear: External ear normal.  Left Ear: External ear normal.  Nose: Nose normal.  Mouth/Throat: Oropharynx is clear and moist. No oropharyngeal exudate.  Eyes: Conjunctivae and EOM are normal. Pupils are equal, round, and reactive to light. Right eye exhibits no discharge. Left eye exhibits no discharge. No scleral icterus.  Neck: Normal range of motion. Neck supple. No JVD present. No tracheal deviation present. No thyromegaly present.  Cardiovascular: Normal rate, regular rhythm, normal heart sounds and  intact distal pulses.  Exam reveals no gallop and no friction rub.   No murmur heard. Pulmonary/Chest: Effort normal and breath sounds normal. No stridor. No respiratory distress. He has no wheezes. He has no rales. He exhibits no tenderness.  Abdominal: Soft. Bowel sounds are normal. He exhibits no distension and no mass. There is no tenderness. There is no rebound and no guarding.  Genitourinary: Rectum normal, prostate normal and penis normal. Guaiac negative stool. No penile tenderness.  Musculoskeletal: Normal range of motion. He exhibits no edema and no tenderness.  Lymphadenopathy:    He has no cervical adenopathy.  Neurological: He is alert and oriented to person, place, and time. He has normal reflexes. He displays normal reflexes. No cranial nerve deficit. He exhibits normal muscle tone. Coordination normal.  Skin: Skin is warm and dry. No rash noted. He is not diaphoretic. No erythema. No pallor.  Psychiatric: He has a normal mood and affect. His behavior is normal. Judgment and thought content normal.          Assessment & Plan:  1. Antibody response examination - Measles/Mumps/Rubella Immunity  2. Routine general medical examination at a health care facility Patient's most recent lab work is listed below: Lab on 02/21/2013  Component Date Value Range Status  . WBC 02/21/2013 7.3  4.0 - 10.5 K/uL Final  . RBC 02/21/2013 4.60  4.22 - 5.81 MIL/uL Final  . Hemoglobin 02/21/2013 15.8  13.0 - 17.0 g/dL Final  . HCT 02/21/2013 43.7  39.0 - 52.0 % Final  . MCV 02/21/2013 95.0  78.0 - 100.0 fL Final  . MCH 02/21/2013 34.3* 26.0 - 34.0 pg Final  . MCHC 02/21/2013 36.2* 30.0 - 36.0 g/dL Final  . RDW 02/21/2013 12.5  11.5 - 15.5 % Final  . Platelets 02/21/2013 236  150 - 400 K/uL Final  . Neutrophils Relative % 02/21/2013 42* 43 - 77 % Final  . Neutro Abs 02/21/2013 3.0  1.7 - 7.7 K/uL Final  . Lymphocytes Relative 02/21/2013 46  12 - 46 % Final  . Lymphs Abs 02/21/2013 3.4  0.7  - 4.0 K/uL Final  . Monocytes Relative 02/21/2013 9  3 - 12 % Final  . Monocytes Absolute 02/21/2013 0.7  0.1 - 1.0 K/uL Final  . Eosinophils Relative 02/21/2013 2  0 - 5 % Final  . Eosinophils Absolute 02/21/2013 0.2  0.0 - 0.7 K/uL Final  . Basophils Relative 02/21/2013 1  0 - 1 % Final  . Basophils Absolute 02/21/2013 0.1  0.0 - 0.1 K/uL Final  . Smear Review 02/21/2013 Criteria for review not met   Final  . Sodium 02/21/2013 133* 135 - 145 mEq/L Final  . Potassium 02/21/2013 4.9  3.5 - 5.3 mEq/L Final  .  Chloride 02/21/2013 99  96 - 112 mEq/L Final  . CO2 02/21/2013 24  19 - 32 mEq/L Final  . Glucose, Bld 02/21/2013 127* 70 - 99 mg/dL Final  . BUN 02/21/2013 18  6 - 23 mg/dL Final  . Creat 02/21/2013 1.00  0.50 - 1.35 mg/dL Final  . Total Bilirubin 02/21/2013 0.7  0.3 - 1.2 mg/dL Final  . Alkaline Phosphatase 02/21/2013 60  39 - 117 U/L Final  . AST 02/21/2013 46* 0 - 37 U/L Final  . ALT 02/21/2013 59* 0 - 53 U/L Final  . Total Protein 02/21/2013 6.9  6.0 - 8.3 g/dL Final  . Albumin 02/21/2013 4.3  3.5 - 5.2 g/dL Final  . Calcium 02/21/2013 9.5  8.4 - 10.5 mg/dL Final  . Cholesterol 02/21/2013 158  0 - 200 mg/dL Final   Comment: ATP III Classification:                                < 200        mg/dL        Desirable                               200 - 239     mg/dL        Borderline High                               >= 240        mg/dL        High                             . Triglycerides 02/21/2013 81  <150 mg/dL Final  . HDL 02/21/2013 44  >39 mg/dL Final  . Total CHOL/HDL Ratio 02/21/2013 3.6   Final  . VLDL 02/21/2013 16  0 - 40 mg/dL Final  . LDL Cholesterol 02/21/2013 98  0 - 99 mg/dL Final   Comment:                            Total Cholesterol/HDL Ratio:CHD Risk                                                 Coronary Heart Disease Risk Table                                                                 Men       Women                                   1/2  Average Risk              3.4        3.3  Average Risk              5.0        4.4                                    2X Average Risk              9.6        7.1                                    3X Average Risk             23.4       11.0                          Use the calculated Patient Ratio above and the CHD Risk table                           to determine the patient's CHD Risk.                          ATP III Classification (LDL):                                < 100        mg/dL         Optimal                               100 - 129     mg/dL         Near or Above Optimal                               130 - 159     mg/dL         Borderline High                               160 - 189     mg/dL         High                                > 190        mg/dL         Very High                             . PSA 02/21/2013 0.14  <=4.00 ng/mL Final   Comment: Test Methodology: ECLIA PSA (Electrochemiluminescence Immunoassay)                                                     For PSA values from 2.5-4.0, particularly in younger men <60 years  old, the AUA and NCCN suggest testing for % Free PSA (3515) and                          evaluation of the rate of increase in PSA (PSA velocity).  Orders Only on 02/21/2013  Component Date Value Range Status  . Hemoglobin A1C 02/21/2013 6.4* <5.7 % Final   Comment:                                                                                                 According to the ADA Clinical Practice Recommendations for 2011, when                          HbA1c is used as a screening test:                                                       >=6.5%   Diagnostic of Diabetes Mellitus                                     (if abnormal result is confirmed)                                                     5.7-6.4%   Increased risk of developing Diabetes Mellitus                                                      References:Diagnosis and Classification of Diabetes Mellitus,Diabetes                          MLYY,5035,46(FKCLE 1):S62-S69 and Standards of Medical Care in                                  Diabetes - 2011,Diabetes XNTZ,0017,49 (Suppl 1):S11-S61.                             . Mean Plasma Glucose 02/21/2013 137* <117 mg/dL Final   labs are excellent except for his chronic prediabetes and his mild liver inflammation. I recommended 30 minutes to an hour of aerobic exercise 3-5 days a week. I recommend a low carbohydrate diet. I recommended 10-15 pounds weight loss. I will give the patient his tetanus vaccine today as you requested. The patient is willing to pay out  of pocket if his insurance will cover it. I'll also check a measles titer. If his lab work confirms that he is not immune, the patient will gladly come back for the MMR vaccine.  Otherwise his physical exam is completely normal.  - Ambulatory referral to Gastroenterology

## 2013-03-01 LAB — MEASLES/MUMPS/RUBELLA IMMUNITY
Mumps IgG: >300 AU/mL — ABNORMAL HIGH (ref <9.00)
Rubella: 25.5 Index — ABNORMAL HIGH (ref <0.90)
Rubeola IgG: >300 AU/mL — ABNORMAL HIGH (ref <25.00)

## 2013-11-12 ENCOUNTER — Ambulatory Visit (INDEPENDENT_AMBULATORY_CARE_PROVIDER_SITE_OTHER): Payer: BC Managed Care – PPO | Admitting: Family Medicine

## 2013-11-12 DIAGNOSIS — Z23 Encounter for immunization: Secondary | ICD-10-CM

## 2014-01-21 ENCOUNTER — Encounter: Payer: Self-pay | Admitting: Physician Assistant

## 2014-01-21 ENCOUNTER — Ambulatory Visit (INDEPENDENT_AMBULATORY_CARE_PROVIDER_SITE_OTHER): Payer: BC Managed Care – PPO | Admitting: Physician Assistant

## 2014-01-21 VITALS — BP 158/94 | HR 60 | Temp 98.5°F | Resp 18 | Wt 186.0 lb

## 2014-01-21 DIAGNOSIS — B9689 Other specified bacterial agents as the cause of diseases classified elsewhere: Secondary | ICD-10-CM

## 2014-01-21 DIAGNOSIS — J988 Other specified respiratory disorders: Secondary | ICD-10-CM

## 2014-01-21 MED ORDER — AZITHROMYCIN 250 MG PO TABS: ORAL_TABLET | ORAL | Status: DC

## 2014-01-21 NOTE — Progress Notes (Signed)
    Patient ID: Bradley Baldwin MRN: [id number], DOB: 03/03/1950, 63 y.o. Date of Encounter: 01/21/2014, 1:04 PM    Chief Complaint:  Chief Complaint  Patient presents with  . sick x 4 days    head congestion, cough     HPI: 63 y.o. year old white male reports the above symptoms. Says that he is also having congestion in his chest which is causing the cough. Also having congestion in his head and nose with mucus. Has had no known fevers or chills and no significant sore throat or earache.     Home Meds:   Outpatient Prescriptions Prior to Visit  Medication Sig Dispense Refill  . aspirin EC 81 MG tablet Take 81 mg by mouth daily.    Marland Kitchen atenolol (TENORMIN) 50 MG tablet Take 1 tablet (50 mg total) by mouth daily. 90 tablet 4  . hydrochlorothiazide (MICROZIDE) 12.5 MG capsule Take 1 capsule (12.5 mg total) by mouth daily. 90 capsule 4  . lisinopril (PRINIVIL,ZESTRIL) 40 MG tablet Take 1 tablet (40 mg total) by mouth daily. 90 tablet 4  . fluticasone (FLONASE) 50 MCG/ACT nasal spray Place 2 sprays into the nose daily. (Patient not taking: Reported on 01/21/2014) 16 g 6   No facility-administered medications prior to visit.    Allergies: No Known Allergies    Review of Systems: See HPI for pertinent ROS. All other ROS negative.    Physical Exam: Blood pressure 158/94, pulse 60, temperature 98.5 F (36.9 C), temperature source Oral, resp. rate 18, weight 186 lb (84.369 kg)., Body mass index is 31.91 kg/(m^2). General: WNWD WM.  Appears in no acute distress. HEENT: Normocephalic, atraumatic, eyes without discharge, sclera non-icteric, nares are without discharge. Bilateral auditory canals clear, TM's are without perforation, pearly grey and translucent with reflective cone of light bilaterally. Oral cavity moist, posterior pharynx without exudate, erythema, peritonsillar abscess.  Neck: Supple. No thyromegaly. No lymphadenopathy. Lungs: Clear bilaterally to auscultation without  wheezes, rales, or rhonchi. Breathing is unlabored. Heart: Regular rhythm. No murmurs, rubs, or gallops. Msk:  Strength and tone normal for age. Extremities/Skin: Warm and dry. Neuro: Alert and oriented X 3. Moves all extremities spontaneously. Gait is normal. CNII-XII grossly in tact. Psych:  Responds to questions appropriately with a normal affect.     ASSESSMENT AND PLAN:  63 y.o. year old male with  1. Bacterial respiratory infection - azithromycin (ZITHROMAX) 250 MG tablet; Day 1: Take 2 daily.  Days 2-5: Take 1 daily.  Dispense: 6 tablet; Refill: 0 Also use over-the-counter decongestants and expectorants as needed for symptom relief. Follow-up if symptoms do not resolve within 1 week after completion of antibiotic.  [address] [place], [place], [facility] 01/21/2014 1:04 PM

## 2014-02-05 DIAGNOSIS — I10 Essential (primary) hypertension: Secondary | ICD-10-CM | POA: Insufficient documentation

## 2014-02-05 DIAGNOSIS — Z86010 Personal history of colonic polyps: Secondary | ICD-10-CM | POA: Insufficient documentation

## 2014-04-09 DIAGNOSIS — K648 Other hemorrhoids: Secondary | ICD-10-CM | POA: Insufficient documentation

## 2014-05-14 ENCOUNTER — Other Ambulatory Visit: Payer: Self-pay | Admitting: Family Medicine

## 2014-05-14 ENCOUNTER — Other Ambulatory Visit: Payer: BLUE CROSS/BLUE SHIELD

## 2014-05-14 DIAGNOSIS — Z125 Encounter for screening for malignant neoplasm of prostate: Secondary | ICD-10-CM

## 2014-05-14 DIAGNOSIS — K760 Fatty (change of) liver, not elsewhere classified: Secondary | ICD-10-CM

## 2014-05-14 DIAGNOSIS — Z79899 Other long term (current) drug therapy: Secondary | ICD-10-CM

## 2014-05-14 DIAGNOSIS — Z0000 Encounter for general adult medical examination without abnormal findings: Secondary | ICD-10-CM

## 2014-05-14 DIAGNOSIS — I10 Essential (primary) hypertension: Secondary | ICD-10-CM

## 2014-05-14 DIAGNOSIS — R7303 Prediabetes: Secondary | ICD-10-CM

## 2014-05-14 LAB — CBC WITH DIFFERENTIAL/PLATELET
BASOS ABS: 0.1 10*3/uL (ref 0.0–0.1)
Basophils Relative: 1 % (ref 0–1)
EOS PCT: 3 % (ref 0–5)
Eosinophils Absolute: 0.2 10*3/uL (ref 0.0–0.7)
HCT: 41.9 % (ref 39.0–52.0)
Hemoglobin: 14.7 g/dL (ref 13.0–17.0)
LYMPHS PCT: 51 % — AB (ref 12–46)
Lymphs Abs: 3.4 10*3/uL (ref 0.7–4.0)
MCH: 33.7 pg (ref 26.0–34.0)
MCHC: 35.1 g/dL (ref 30.0–36.0)
MCV: 96.1 fL (ref 78.0–100.0)
MONOS PCT: 9 % (ref 3–12)
MPV: 10.2 fL (ref 8.6–12.4)
Monocytes Absolute: 0.6 10*3/uL (ref 0.1–1.0)
Neutro Abs: 2.4 10*3/uL (ref 1.7–7.7)
Neutrophils Relative %: 36 % — ABNORMAL LOW (ref 43–77)
PLATELETS: 203 10*3/uL (ref 150–400)
RBC: 4.36 MIL/uL (ref 4.22–5.81)
RDW: 12.2 % (ref 11.5–15.5)
WBC: 6.6 10*3/uL (ref 4.0–10.5)

## 2014-05-14 LAB — COMPLETE METABOLIC PANEL WITH GFR
ALBUMIN: 3.8 g/dL (ref 3.5–5.2)
ALK PHOS: 66 U/L (ref 39–117)
ALT: 59 U/L — ABNORMAL HIGH (ref 0–53)
AST: 56 U/L — AB (ref 0–37)
BILIRUBIN TOTAL: 1.1 mg/dL (ref 0.2–1.2)
BUN: 10 mg/dL (ref 6–23)
CO2: 24 meq/L (ref 19–32)
Calcium: 9.3 mg/dL (ref 8.4–10.5)
Chloride: 102 mEq/L (ref 96–112)
Creat: 1.01 mg/dL (ref 0.50–1.35)
GFR, Est African American: >89 mL/min
GFR, Est Non African American: 79 mL/min
Glucose, Bld: 167 mg/dL — ABNORMAL HIGH (ref 70–99)
Potassium: 4.5 mEq/L (ref 3.5–5.3)
SODIUM: 133 meq/L — AB (ref 135–145)
Total Protein: 6.3 g/dL (ref 6.0–8.3)

## 2014-05-14 LAB — TSH: TSH: 1.268 u[IU]/mL (ref 0.350–4.500)

## 2014-05-14 LAB — LIPID PANEL
CHOL/HDL RATIO: 3.4 ratio
CHOLESTEROL: 157 mg/dL (ref 0–200)
HDL: 46 mg/dL (ref >=40)
LDL CALC: 93 mg/dL (ref 0–99)
TRIGLYCERIDES: 91 mg/dL (ref <150)
VLDL: 18 mg/dL (ref 0–40)

## 2014-05-15 LAB — PSA: PSA: 0.16 ng/mL (ref <=4.00)

## 2014-05-19 LAB — HEMOGLOBIN A1C
Hgb A1c MFr Bld: 9.9 % — ABNORMAL HIGH (ref <5.7)
Mean Plasma Glucose: 237 mg/dL — ABNORMAL HIGH (ref <117)

## 2014-05-29 ENCOUNTER — Ambulatory Visit (INDEPENDENT_AMBULATORY_CARE_PROVIDER_SITE_OTHER): Payer: BLUE CROSS/BLUE SHIELD | Admitting: Family Medicine

## 2014-05-29 ENCOUNTER — Encounter: Payer: Self-pay | Admitting: Family Medicine

## 2014-05-29 VITALS — BP 142/80 | HR 78 | Temp 98.5°F | Resp 14 | Ht 63.5 in | Wt 166.0 lb

## 2014-05-29 DIAGNOSIS — E1165 Type 2 diabetes mellitus with hyperglycemia: Secondary | ICD-10-CM

## 2014-05-29 DIAGNOSIS — Z0000 Encounter for general adult medical examination without abnormal findings: Secondary | ICD-10-CM | POA: Diagnosis not present

## 2014-05-29 DIAGNOSIS — Z23 Encounter for immunization: Secondary | ICD-10-CM

## 2014-05-29 DIAGNOSIS — IMO0002 Reserved for concepts with insufficient information to code with codable children: Secondary | ICD-10-CM

## 2014-05-29 MED ORDER — METFORMIN HCL 1000 MG PO TABS: 1000.0000 mg | ORAL_TABLET | Freq: Two times a day (BID) | ORAL | Status: DC

## 2014-05-29 MED ORDER — HYDROCHLOROTHIAZIDE 12.5 MG PO CAPS: 12.5000 mg | ORAL_CAPSULE | Freq: Every day | ORAL | Status: DC

## 2014-05-29 MED ORDER — LISINOPRIL 40 MG PO TABS: 40.0000 mg | ORAL_TABLET | Freq: Every day | ORAL | Status: DC

## 2014-05-29 MED ORDER — ONETOUCH ULTRA SYSTEM W/DEVICE KIT: PACK | Status: DC

## 2014-05-29 MED ORDER — ATENOLOL 50 MG PO TABS: 50.0000 mg | ORAL_TABLET | Freq: Every day | ORAL | Status: DC

## 2014-05-29 NOTE — Progress Notes (Signed)
Subjective:    Patient ID: Bradley Baldwin, male    DOB: 23-Oct-1950, 64 y.o.   MRN: [id number]  HPI Patient is here today for complete physical exam. His colonoscopy was just performed and was significant for 4 tubular adenomas. He is scheduled repeat colonoscopy in 3 years. Patient's prostate exam was performed today and is normal. His PSA is virtually undetectable. However his lab work was significant for uncontrolled diabetes. Please see the lab work below. Otherwise the patient has been doing well without complications. Past Medical History  Diagnosis Date  . Hypertension     pcp   dr pickerd   [place]  . GERD (gastroesophageal reflux disease)   . Arthritis     "starting to get achy joints here and there" (11/28/2011)  . External bleeding hemorrhoids     "occasionally" (11/28/2011)  . Fatty liver disease, nonalcoholic   . Prediabetes   . Colon polyps    Past Surgical History  Procedure Laterality Date  . 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 Total hip arthroplasty  11/28/2011    Procedure: TOTAL HIP ARTHROPLASTY ANTERIOR APPROACH;  Surgeon: Hessie Dibble, MD;  Location: [facility];  Service: Orthopedics;  Laterality: Right;   Current Outpatient Prescriptions on File Prior to Visit  Medication Sig Dispense Refill  . aspirin EC 81 MG tablet Take 81 mg by mouth daily.    Marland Kitchen atenolol (TENORMIN) 50 MG tablet Take 1 tablet (50 mg total) by mouth daily. 90 tablet 4  . fluticasone (FLONASE) 50 MCG/ACT nasal spray Place 2 sprays into the nose daily. 16 g 6  . hydrochlorothiazide (MICROZIDE) 12.5 MG capsule Take 1 capsule (12.5 mg total) by mouth daily. 90 capsule 4  . lisinopril (PRINIVIL,ZESTRIL) 40 MG tablet Take 1 tablet (40 mg total) by mouth daily. 90 tablet 4   No current facility-administered medications on file prior to visit.   No Known Allergies History   Social History  . Marital Status: Married    Spouse Name: N/A  . Number of Children: N/A  . Years of  Education: N/A   Occupational History  . Not on file.   Social History Main Topics  . Smoking status: Former Smoker -- 2.00 packs/day for 10 years    Types: Cigarettes  . Smokeless tobacco: Former Systems developer    Types: Chew     Comment: 11/28/2011 "quit smoking ~ 30 yr ago; chewed some then quit that"  . Alcohol Use: 3.6 oz/week    6 Cans of beer per week     Comment: 11/28/2011 "drink a few beers 3 times/wk; ~ 6pk total/wk"  . Drug Use: No  . Sexual Activity: Yes   Other Topics Concern  . Not on file   Social History Narrative   No family history on file.' Lab on 05/14/2014  Component Date Value Ref Range Status  . Sodium 05/14/2014 133* 135 - 145 mEq/L Final  . Potassium 05/14/2014 4.5  3.5 - 5.3 mEq/L Final  . Chloride 05/14/2014 102  96 - 112 mEq/L Final  . CO2 05/14/2014 24  19 - 32 mEq/L Final  . Glucose, Bld 05/14/2014 167* 70 - 99 mg/dL Final  . BUN 05/14/2014 10  6 - 23 mg/dL Final  . Creat 05/14/2014 1.01  0.50 - 1.35 mg/dL Final  . Total Bilirubin 05/14/2014 1.1  0.2 - 1.2 mg/dL Final  . Alkaline Phosphatase 05/14/2014 66  39 - 117 U/L Final  . AST 05/14/2014 56*  0 - 37 U/L Final  . ALT 05/14/2014 59* 0 - 53 U/L Final  . Total Protein 05/14/2014 6.3  6.0 - 8.3 g/dL Final  . Albumin 05/14/2014 3.8  3.5 - 5.2 g/dL Final  . Calcium 05/14/2014 9.3  8.4 - 10.5 mg/dL Final  . GFR, Est African American 05/14/2014 >89   Final  . GFR, Est Non African American 05/14/2014 79   Final   Comment:   The estimated GFR is a calculation valid for adults (>=12 years old) that uses the CKD-EPI algorithm to adjust for age and sex. It is   not to be used for children, pregnant women, hospitalized patients,    patients on dialysis, or with rapidly changing kidney function. According to the NKDEP, eGFR >89 is normal, 60-89 shows mild impairment, 30-59 shows moderate impairment, 15-29 shows severe impairment and <15 is ESRD.     . TSH 05/14/2014 1.268  0.350 - 4.500 uIU/mL Final  .  Cholesterol 05/14/2014 157  0 - 200 mg/dL Final   Comment: ATP III Classification:       < 200        mg/dL        Desirable      200 - 239     mg/dL        Borderline High      >= 240        mg/dL        High     . Triglycerides 05/14/2014 91  <150 mg/dL Final  . HDL 05/14/2014 46  >=40 mg/dL Final   ** Please note change in reference range(s). **  . Total CHOL/HDL Ratio 05/14/2014 3.4   Final  . VLDL 05/14/2014 18  0 - 40 mg/dL Final  . LDL Cholesterol 05/14/2014 93  0 - 99 mg/dL Final   Comment:   Total Cholesterol/HDL Ratio:CHD Risk                        Coronary Heart Disease Risk Table                                        Men       Women          1/2 Average Risk              3.4        3.3              Average Risk              5.0        4.4           2X Average Risk              9.6        7.1           3X Average Risk             23.4       11.0 Use the calculated Patient Ratio above and the CHD Risk table  to determine the patient's CHD Risk. ATP III Classification (LDL):       < 100        mg/dL         Optimal      100 - 129     mg/dL  Near or Above Optimal      130 - 159     mg/dL         Borderline High      160 - 189     mg/dL         High       > 190        mg/dL         Very High     . WBC 05/14/2014 6.6  4.0 - 10.5 K/uL Final  . RBC 05/14/2014 4.36  4.22 - 5.81 MIL/uL Final  . Hemoglobin 05/14/2014 14.7  13.0 - 17.0 g/dL Final  . HCT 05/14/2014 41.9  39.0 - 52.0 % Final  . MCV 05/14/2014 96.1  78.0 - 100.0 fL Final  . MCH 05/14/2014 33.7  26.0 - 34.0 pg Final  . MCHC 05/14/2014 35.1  30.0 - 36.0 g/dL Final  . RDW 05/14/2014 12.2  11.5 - 15.5 % Final  . Platelets 05/14/2014 203  150 - 400 K/uL Final  . MPV 05/14/2014 10.2  8.6 - 12.4 fL Final  . Neutrophils Relative % 05/14/2014 36* 43 - 77 % Final  . Neutro Abs 05/14/2014 2.4  1.7 - 7.7 K/uL Final  . Lymphocytes Relative 05/14/2014 51* 12 - 46 % Final  . Lymphs Abs 05/14/2014 3.4  0.7 - 4.0  K/uL Final  . Monocytes Relative 05/14/2014 9  3 - 12 % Final  . Monocytes Absolute 05/14/2014 0.6  0.1 - 1.0 K/uL Final  . Eosinophils Relative 05/14/2014 3  0 - 5 % Final  . Eosinophils Absolute 05/14/2014 0.2  0.0 - 0.7 K/uL Final  . Basophils Relative 05/14/2014 1  0 - 1 % Final  . Basophils Absolute 05/14/2014 0.1  0.0 - 0.1 K/uL Final  . Smear Review 05/14/2014 Criteria for review not met   Final  . PSA 05/14/2014 0.16  <=4.00 ng/mL Final   Comment: Test Methodology: ECLIA PSA (Electrochemiluminescence Immunoassay)   For PSA values from 2.5-4.0, particularly in younger men <38 years old, the AUA and NCCN suggest testing for % Free PSA (3515) and evaluation of the rate of increase in PSA (PSA velocity).   Orders Only on 05/14/2014  Component Date Value Ref Range Status  . Hgb A1c MFr Bld 05/14/2014 9.9* <5.7 % Final   Comment:                                                                        According to the ADA Clinical Practice Recommendations for 2011, when HbA1c is used as a screening test:     >=6.5%   Diagnostic of Diabetes Mellitus            (if abnormal result is confirmed)   5.7-6.4%   Increased risk of developing Diabetes Mellitus   References:Diagnosis and Classification of Diabetes Mellitus,Diabetes ZOXW,9604,54(UJWJX 1):S62-S69 and Standards of Medical Care in         Diabetes - 2011,Diabetes BJYN,8295,62 (Suppl 1):S11-S61.     . Mean Plasma Glucose 05/14/2014 237* <117 mg/dL Final     Review of Systems  All other systems reviewed and are negative.      Objective:   Physical Exam  Constitutional: He is  oriented to person, place, and time. He appears well-developed and well-nourished. No distress.  HENT:  Head: Normocephalic and atraumatic.  Right Ear: External ear normal.  Left Ear: External ear normal.  Nose: Nose normal.  Mouth/Throat: Oropharynx is clear and moist. No oropharyngeal exudate.  Eyes: Conjunctivae and EOM are normal. Pupils are  equal, round, and reactive to light. Right eye exhibits no discharge. Left eye exhibits no discharge. No scleral icterus.  Neck: Normal range of motion. Neck supple. No JVD present. No tracheal deviation present. No thyromegaly present.  Cardiovascular: Normal rate, regular rhythm, normal heart sounds and intact distal pulses.  Exam reveals no gallop and no friction rub.   No murmur heard. Pulmonary/Chest: Effort normal and breath sounds normal. No stridor. No respiratory distress. He has no wheezes. He has no rales. He exhibits no tenderness.  Abdominal: Soft. Bowel sounds are normal. He exhibits no distension and no mass. There is no tenderness. There is no rebound and no guarding.  Genitourinary: Rectum normal and prostate normal.  Musculoskeletal: Normal range of motion. He exhibits no edema or tenderness.  Lymphadenopathy:    He has no cervical adenopathy.  Neurological: He is alert and oriented to person, place, and time. He has normal reflexes. He displays normal reflexes. No cranial nerve deficit. He exhibits normal muscle tone. Coordination normal.  Skin: Skin is warm. No rash noted. He is not diaphoretic. No erythema. No pallor.  Psychiatric: He has a normal mood and affect. His behavior is normal. Judgment and thought content normal.  Vitals reviewed.         Assessment & Plan:  Routine general medical examination at a health care facility  Diabetes mellitus type II, uncontrolled - Plan: Pneumococcal polysaccharide vaccine 23-valent greater than or equal to 2yo subcutaneous/IM  Need for prophylactic vaccination against Streptococcus pneumoniae (pneumococcus) - Plan: Pneumococcal polysaccharide vaccine 23-valent greater than or equal to 2yo subcutaneous/IM  Start the patient on metformin 1000 mg by mouth twice a day. I will recheck the patient in one month. His fasting blood sugars are not less than 130 and his two-hour postprandial sugars are not less than 160 I will start the  patient on Invokana. He received Pneumovax 23 today. The remainder of his lab work is excellent. He received Pneumovax 23 today in clinic because the diabetes. We spent 15 minutes discussing a low carbohydrate diet and increasing aerobic exercise. Tablet foot exam was performed. I recommended an anal diabetic eye exam. In 51 months old) recheck all his lab work including a urine microalbumin. He is already on maximum dose ACE inhibitor

## 2014-06-25 ENCOUNTER — Encounter: Payer: Self-pay | Admitting: Family Medicine

## 2014-06-25 ENCOUNTER — Ambulatory Visit (INDEPENDENT_AMBULATORY_CARE_PROVIDER_SITE_OTHER): Payer: BLUE CROSS/BLUE SHIELD | Admitting: Family Medicine

## 2014-06-25 VITALS — BP 108/60 | HR 60 | Temp 98.3°F | Resp 16 | Wt 159.0 lb

## 2014-06-25 DIAGNOSIS — E119 Type 2 diabetes mellitus without complications: Secondary | ICD-10-CM

## 2014-06-25 DIAGNOSIS — L821 Other seborrheic keratosis: Secondary | ICD-10-CM | POA: Diagnosis not present

## 2014-06-25 NOTE — Progress Notes (Signed)
Subjective:    Patient ID: Bradley Baldwin, male    DOB: 1950-12-10, 64 y.o.   MRN: [id number]  HPI Saw patient for CPE 05/29/14 and our plan at that time was: Start the patient on metformin 1000 mg by mouth twice a day. I will recheck the patient in one month. His fasting blood sugars are not less than 130 and his two-hour postprandial sugars are not less than 160 I will start the patient on Invokana. He received Pneumovax 23 today. The remainder of his lab work is excellent. He received Pneumovax 23 today in clinic because the diabetes. We spent 15 minutes discussing a low carbohydrate diet and increasing aerobic exercise. Tablet foot exam was performed. I recommended an anal diabetic eye exam. In 32 months old) recheck all his lab work including a urine microalbumin. He is already on maximum dose ACE inhibitor.  Patient's fasting blood sugar is ranging 80-110 in the mornings. His two-hour postprandial sugars are ranging 80-128 in the evening. He is having no evidence of hypoglycemia or hyperglycemia. His blood sugars seem well controlled. He is asking me to remove 3 lesions from his back. Each of these lesions are proximally 6 mm in size. They're all brown and warty in appearance. There is no erythema or scale or signs of irritation or malignancy.    Past Medical History  Diagnosis Date  . Hypertension     pcp   dr pickerd   [place]  . GERD (gastroesophageal reflux disease)   . Arthritis     "starting to get achy joints here and there" (11/28/2011)  . External bleeding hemorrhoids     "occasionally" (11/28/2011)  . Fatty liver disease, nonalcoholic   . Prediabetes   . Colon polyps    Past Surgical History  Procedure Laterality Date  . 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 aspirin EC 81 MG tablet Take 81 mg by mouth daily.    Marland Kitchen atenolol (TENORMIN) 50 MG tablet Take 1 tablet (50 mg total) by mouth daily. 90 tablet 4  . Blood Glucose Monitoring Suppl (ONE TOUCH ULTRA SYSTEM KIT) W/DEVICE KIT Checks BS qd-bid - New onset DM - Needs meter, strips #200/4 rf - lancets #200/4 rf - DX E11.65 1 each 0  . fluticasone (FLONASE) 50 MCG/ACT nasal spray Place 2 sprays into the nose daily. 16 g 6  . hydrochlorothiazide (MICROZIDE) 12.5 MG capsule Take 1 capsule (12.5 mg total) by mouth daily. 90 capsule 4  . lisinopril (PRINIVIL,ZESTRIL) 40 MG tablet Take 1 tablet (40 mg total) by mouth daily. 90 tablet 4  . metFORMIN (GLUCOPHAGE) 1000 MG tablet Take 1 tablet (1,000 mg total) by mouth 2 (two) times daily with a meal. 180 tablet 3   No current facility-administered medications on file prior to visit.   No Known Allergies History   Social History  . Marital Status: Married    Spouse Name: N/A  . Number of Children: N/A  . Years of Education: N/A   Occupational History  . Not on file.   Social History Main Topics  . Smoking status: Former Smoker -- 2.00 packs/day for 10 years    Types: Cigarettes  . Smokeless tobacco:  Former User    Types: Chew     Comment: 11/28/2011 "quit smoking ~ 30 yr ago; chewed some then quit that"  . Alcohol Use: 3.6 oz/week    6 Cans of beer per week     Comment: 11/28/2011 "drink a few beers 3 times/wk; ~ 6pk total/wk"  . Drug Use: No  . Sexual Activity: Yes   Other Topics Concern  . Not on file   Social History Narrative   No family history on file.'  Review of Systems  All other systems reviewed and are negative.      Objective:   Physical Exam  Constitutional: He appears well-developed and well-nourished. No distress.  HENT:  Head: Normocephalic and atraumatic.  Cardiovascular: Normal rate, regular rhythm, normal heart sounds and intact  distal pulses.  Exam reveals no gallop and no friction rub.   No murmur heard. Pulmonary/Chest: Effort normal and breath sounds normal. No respiratory distress. He has no wheezes. He has no rales. He exhibits no tenderness.  Skin: He is not diaphoretic.  Vitals reviewed.         Assessment & Plan:  Diabetes mellitus type II, controlled  Seborrheic keratoses  Patient's diabetes is well controlled. I anesthetized each of the 3 lesions on the patient's back and remove them using sterile technique with a shave biopsy technique. Hemostasis was achieved with each of them using Drysol. I did not send the lesions to pathology as they were obviously seborrheic keratoses.

## 2014-07-30 LAB — HM DIABETES EYE EXAM

## 2014-08-04 IMAGING — CR DG CHEST 2V
2 series · 2 of 2 positions shown · non-contrast
Comparison: None.

CLINICAL DATA: Right hip replacement, hypertension

CHEST - 2 VIEW

[view not recorded (1 of 2)]
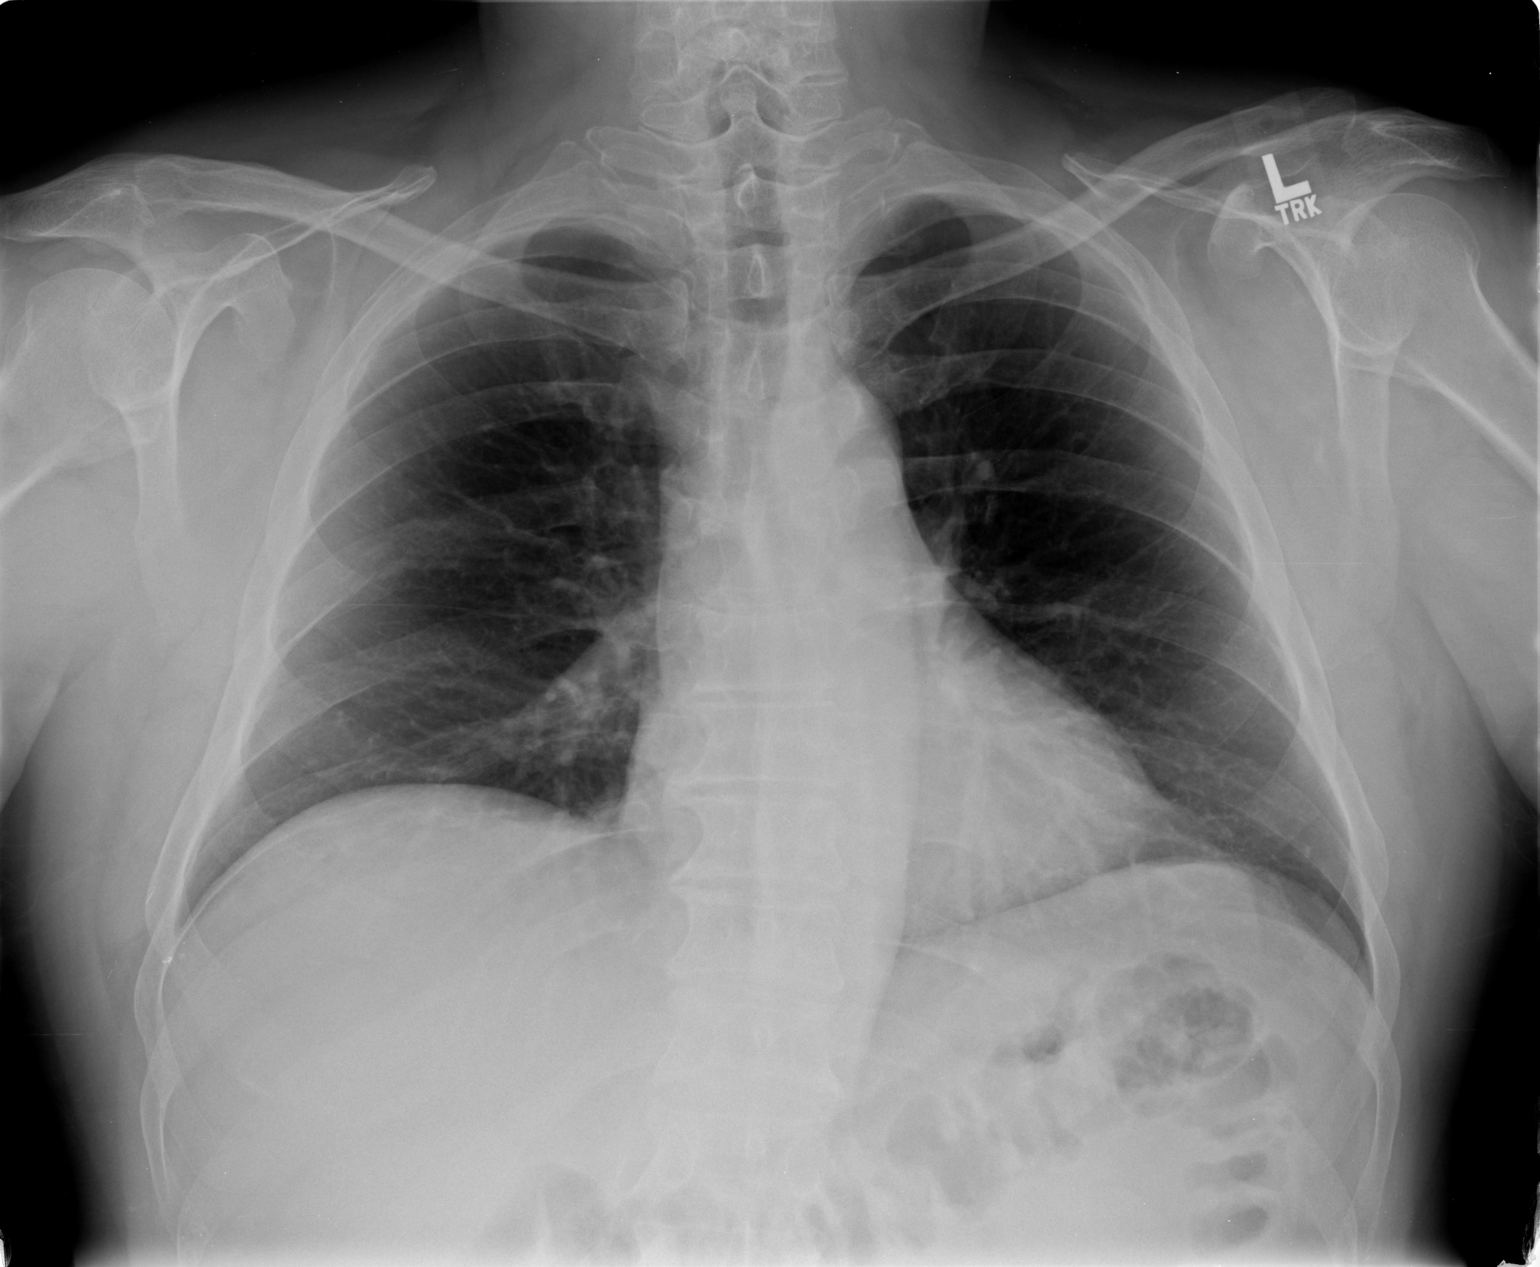

[view not recorded (2 of 2)]
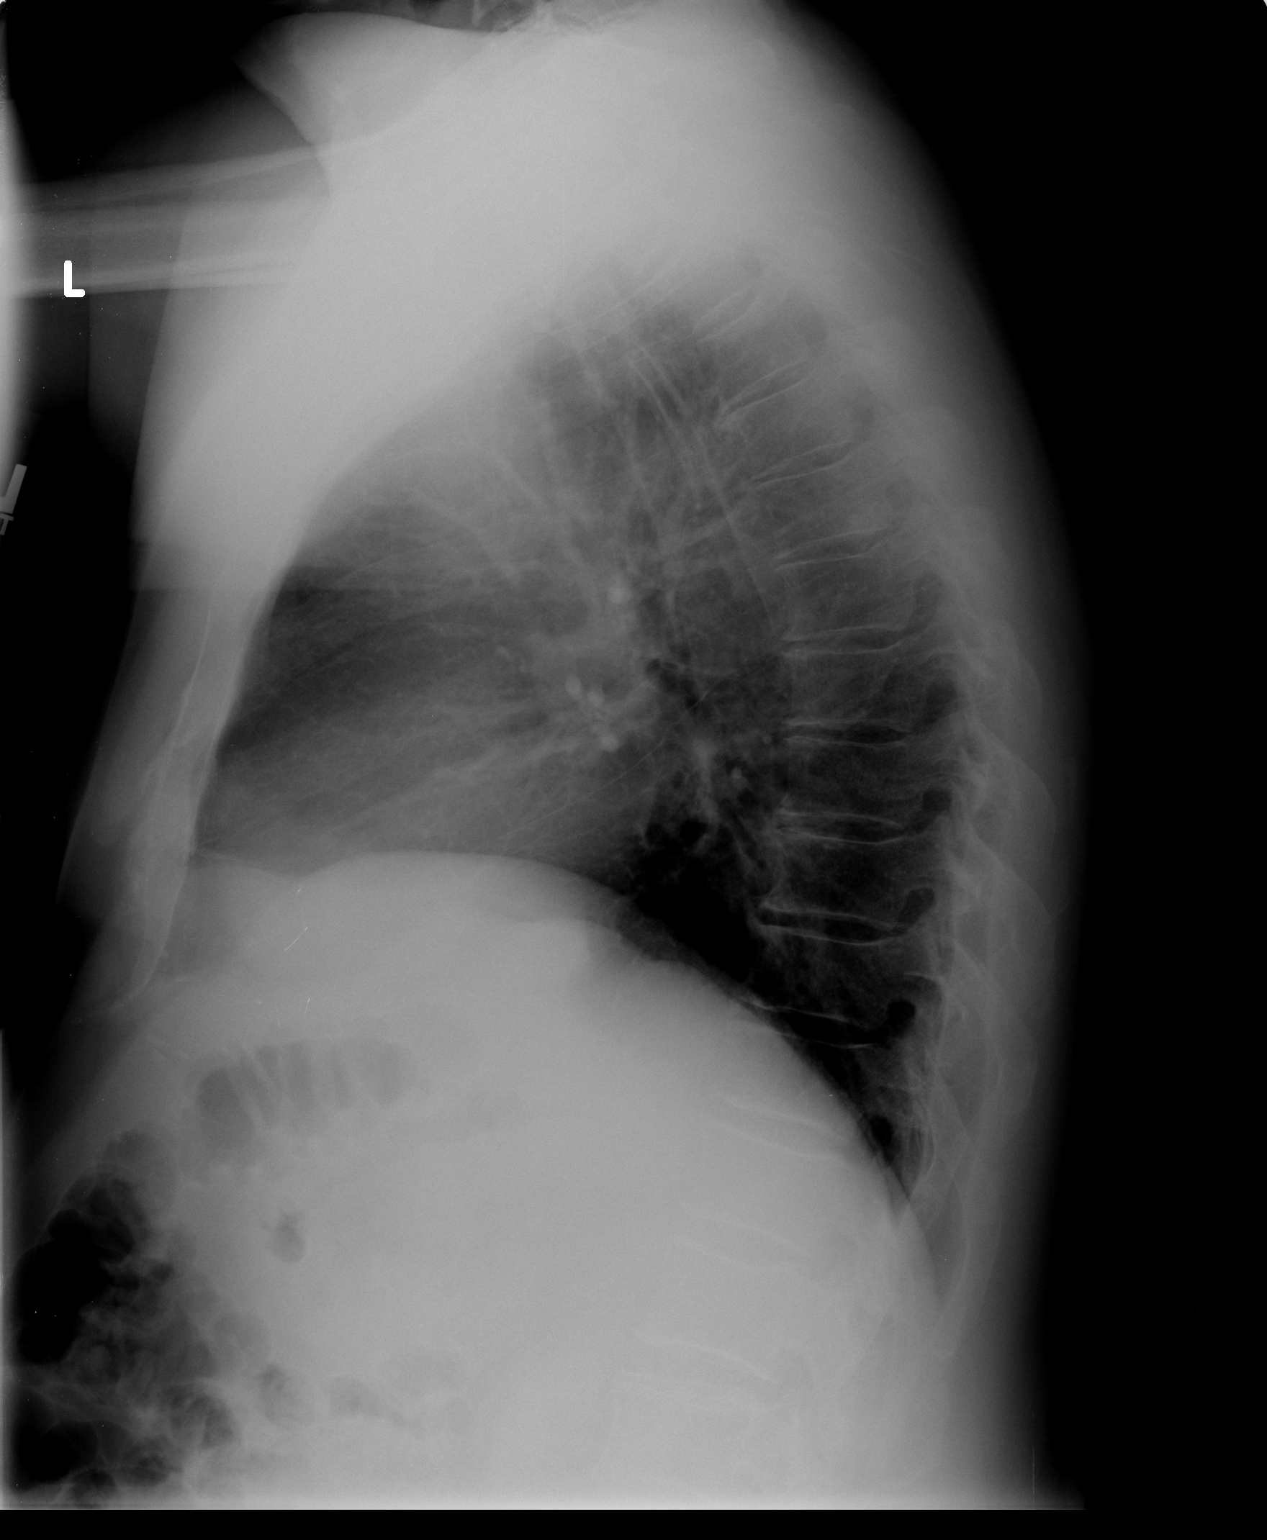

[2 of 2 positions shown; findings below may reference images not displayed]

FINDINGS: Heart size and vascular pattern are normal.  Lungs are
clear.
IMPRESSION: negative study.

## 2014-08-06 ENCOUNTER — Encounter: Payer: Self-pay | Admitting: Family Medicine

## 2014-09-24 ENCOUNTER — Encounter: Payer: Self-pay | Admitting: Physician Assistant

## 2014-09-24 ENCOUNTER — Ambulatory Visit (INDEPENDENT_AMBULATORY_CARE_PROVIDER_SITE_OTHER): Payer: BLUE CROSS/BLUE SHIELD | Admitting: Physician Assistant

## 2014-09-24 VITALS — BP 144/68 | HR 72 | Temp 99.2°F | Resp 16 | Ht 63.5 in | Wt 153.0 lb

## 2014-09-24 DIAGNOSIS — B9689 Other specified bacterial agents as the cause of diseases classified elsewhere: Principal | ICD-10-CM

## 2014-09-24 DIAGNOSIS — J988 Other specified respiratory disorders: Secondary | ICD-10-CM

## 2014-09-24 MED ORDER — AZITHROMYCIN 250 MG PO TABS: ORAL_TABLET | ORAL | Status: DC

## 2014-09-24 NOTE — Progress Notes (Signed)
Patient ID: Bradley Baldwin MRN: [id number], DOB: 01/31/1950, 63 y.o. Date of Encounter: 09/24/2014, 3:41 PM    Chief Complaint:  Chief Complaint  Patient presents with  . Illness    x1 week- post nasal drip, laryngitis, productive cough with yellow green mucus, chest pain with cough, fever/chills     HPI: 63 y.o. year old white male presents with above. Says that he is having congestion and mucus in his head and nose as well as his chest but says his chest has been getting worse and seems to be worse than his head/ nose now.     Home Meds:   Outpatient Prescriptions Prior to Visit  Medication Sig Dispense Refill  . aspirin EC 81 MG tablet Take 81 mg by mouth daily.    . atenolol (TENORMIN) 50 MG tablet Take 1 tablet (50 mg total) by mouth daily. 90 tablet 4  . Blood Glucose Monitoring Suppl (ONE TOUCH ULTRA SYSTEM KIT) W/DEVICE KIT Checks BS qd-bid - New onset DM - Needs meter, strips #200/4 rf - lancets #200/4 rf - DX E11.65 1 each 0  . fluticasone (FLONASE) 50 MCG/ACT nasal spray Place 2 sprays into the nose daily. (Patient taking differently: Place 2 sprays into the nose daily. PRN) 16 g 6  . hydrochlorothiazide (MICROZIDE) 12.5 MG capsule Take 1 capsule (12.5 mg total) by mouth daily. 90 capsule 4  . lisinopril (PRINIVIL,ZESTRIL) 40 MG tablet Take 1 tablet (40 mg total) by mouth daily. 90 tablet 4  . metFORMIN (GLUCOPHAGE) 1000 MG tablet Take 1 tablet (1,000 mg total) by mouth 2 (two) times daily with a meal. 180 tablet 3  . ONE TOUCH ULTRA TEST test strip Check BS bid  0  . ONETOUCH DELICA LANCETS 33G MISC   0   No facility-administered medications prior to visit.    Allergies: No Known Allergies    Review of Systems: See HPI for pertinent ROS. All other ROS negative.    Physical Exam: Blood pressure 144/68, pulse 72, temperature 99.2 F (37.3 C), temperature source Oral, resp. rate 16, height 5' 3.5" (1.613 m), weight 153 lb (69.4 kg)., Body mass index is 26.67  kg/(m^2). General:  WNWD WM. Appears in no acute distress. HEENT: Normocephalic, atraumatic, eyes without discharge, sclera non-icteric, nares are without discharge. Bilateral auditory canals clear, TM's are without perforation, pearly grey and translucent with reflective cone of light bilaterally. Oral cavity moist, posterior pharynx without exudate, erythema, peritonsillar abscess. No tenderness with percussion of frontal or maxillary sinuses bilaterally.  Neck: Supple. No thyromegaly. No lymphadenopathy. Lungs: Clear bilaterally to auscultation without wheezes, rales, or rhonchi. Breathing is unlabored. Heart: Regular rhythm. No murmurs, rubs, or gallops. Msk:  Strength and tone normal for age. Extremities/Skin: Warm and dry. Neuro: Alert and oriented X 3. Moves all extremities spontaneously. Gait is normal. CNII-XII grossly in tact. Psych:  Responds to questions appropriately with a normal affect.     ASSESSMENT AND PLAN:  63 y.o. year old male with  1. Bacterial respiratory infection Take antibiotic as directed and complete all of it. Also recommend he take and expectorate such as Mucinex DM. Symptoms do not resolve within 1 week after completion of antibiotic. - azithromycin (ZITHROMAX) 250 MG tablet; Day 1: Take 2 daily. Days 2-5: Take 1 daily.  Dispense: 6 tablet; Refill: 0   Signed, Mary Beth Dixon, PA, BSFM 09/24/2014 3:41 PM    

## 2014-12-01 ENCOUNTER — Other Ambulatory Visit: Payer: BLUE CROSS/BLUE SHIELD

## 2014-12-01 ENCOUNTER — Ambulatory Visit (INDEPENDENT_AMBULATORY_CARE_PROVIDER_SITE_OTHER): Payer: BLUE CROSS/BLUE SHIELD | Admitting: Family Medicine

## 2014-12-01 DIAGNOSIS — E119 Type 2 diabetes mellitus without complications: Secondary | ICD-10-CM

## 2014-12-01 DIAGNOSIS — Z23 Encounter for immunization: Secondary | ICD-10-CM | POA: Diagnosis not present

## 2014-12-01 DIAGNOSIS — Z79899 Other long term (current) drug therapy: Secondary | ICD-10-CM

## 2014-12-01 DIAGNOSIS — I10 Essential (primary) hypertension: Secondary | ICD-10-CM

## 2014-12-01 LAB — CBC WITH DIFFERENTIAL/PLATELET
Basophils Absolute: 0.1 10*3/uL (ref 0.0–0.1)
Basophils Relative: 1 % (ref 0–1)
EOS ABS: 0.1 10*3/uL (ref 0.0–0.7)
EOS PCT: 1 % (ref 0–5)
HCT: 40.8 % (ref 39.0–52.0)
Hemoglobin: 14.5 g/dL (ref 13.0–17.0)
Lymphocytes Relative: 48 % — ABNORMAL HIGH (ref 12–46)
Lymphs Abs: 3.7 10*3/uL (ref 0.7–4.0)
MCH: 33.7 pg (ref 26.0–34.0)
MCHC: 35.5 g/dL (ref 30.0–36.0)
MCV: 94.9 fL (ref 78.0–100.0)
MONO ABS: 0.8 10*3/uL (ref 0.1–1.0)
MONOS PCT: 10 % (ref 3–12)
MPV: 9.6 fL (ref 8.6–12.4)
Neutro Abs: 3.1 10*3/uL (ref 1.7–7.7)
Neutrophils Relative %: 40 % — ABNORMAL LOW (ref 43–77)
PLATELETS: 221 10*3/uL (ref 150–400)
RBC: 4.3 MIL/uL (ref 4.22–5.81)
RDW: 12.8 % (ref 11.5–15.5)
WBC: 7.8 10*3/uL (ref 4.0–10.5)

## 2014-12-01 LAB — COMPLETE METABOLIC PANEL WITH GFR
ALT: 25 U/L (ref 9–46)
AST: 29 U/L (ref 10–35)
Albumin: 4.1 g/dL (ref 3.6–5.1)
Alkaline Phosphatase: 46 U/L (ref 40–115)
BUN: 11 mg/dL (ref 7–25)
CO2: 32 mmol/L — AB (ref 20–31)
Calcium: 9.3 mg/dL (ref 8.6–10.3)
Chloride: 98 mmol/L (ref 98–110)
Creat: 0.81 mg/dL (ref 0.70–1.25)
Glucose, Bld: 119 mg/dL — ABNORMAL HIGH (ref 70–99)
Potassium: 4.1 mmol/L (ref 3.5–5.3)
SODIUM: 135 mmol/L (ref 135–146)
Total Bilirubin: 0.9 mg/dL (ref 0.2–1.2)
Total Protein: 6.5 g/dL (ref 6.1–8.1)

## 2014-12-01 LAB — HEMOGLOBIN A1C
HEMOGLOBIN A1C: 5.4 % (ref <5.7)
Mean Plasma Glucose: 108 mg/dL (ref <117)

## 2014-12-01 LAB — LIPID PANEL
Cholesterol: 163 mg/dL (ref 125–200)
HDL: 59 mg/dL (ref >=40)
LDL Cholesterol: 85 mg/dL (ref <130)
TRIGLYCERIDES: 97 mg/dL (ref <150)
Total CHOL/HDL Ratio: 2.8 Ratio (ref <=5.0)
VLDL: 19 mg/dL (ref <30)

## 2014-12-03 ENCOUNTER — Encounter: Payer: Self-pay | Admitting: *Deleted

## 2014-12-04 ENCOUNTER — Telehealth: Payer: Self-pay | Admitting: *Deleted

## 2014-12-04 NOTE — Telephone Encounter (Signed)
Received  Summary of patient FSBS.   MD reviewed and states glucose control is excellent.   Call placed to patient and patient made aware.

## 2014-12-28 ENCOUNTER — Ambulatory Visit (INDEPENDENT_AMBULATORY_CARE_PROVIDER_SITE_OTHER): Payer: BLUE CROSS/BLUE SHIELD | Admitting: Physician Assistant

## 2014-12-28 ENCOUNTER — Encounter: Payer: Self-pay | Admitting: Physician Assistant

## 2014-12-28 VITALS — BP 172/94 | HR 60 | Temp 97.9°F | Resp 18 | Wt 155.0 lb

## 2014-12-28 DIAGNOSIS — B9689 Other specified bacterial agents as the cause of diseases classified elsewhere: Principal | ICD-10-CM

## 2014-12-28 DIAGNOSIS — J988 Other specified respiratory disorders: Secondary | ICD-10-CM

## 2014-12-28 MED ORDER — HYDROCODONE-HOMATROPINE 5-1.5 MG/5ML PO SYRP: 5.0000 mL | ORAL_SOLUTION | Freq: Three times a day (TID) | ORAL | Status: DC | PRN

## 2014-12-28 MED ORDER — AZITHROMYCIN 250 MG PO TABS: ORAL_TABLET | ORAL | Status: DC

## 2014-12-28 NOTE — Progress Notes (Signed)
Patient ID: Bradley Baldwin MRN: [id number], DOB: [birth year], 64 y.o. Date of Encounter: 12/28/2014, 2:53 PM    Chief Complaint:  Chief Complaint  Patient presents with  . sick x 3 days    sinusitis, took some OTC     HPI: 64 y.o. year old white male says that Friday night his throat felt sore. Since then his throat feels a little bit sore at night but then resolves during the day. He has been having a lot of drainage in his throat and now that is going into his chest and is causing cough. No known fevers or chills.     Home Meds:   Outpatient Prescriptions Prior to Visit  Medication Sig Dispense Refill  . aspirin EC 81 MG tablet Take 81 mg by mouth daily.    Marland Kitchen atenolol (TENORMIN) 50 MG tablet Take 1 tablet (50 mg total) by mouth daily. 90 tablet 4  . Blood Glucose Monitoring Suppl (ONE TOUCH ULTRA SYSTEM KIT) W/DEVICE KIT Checks BS qd-bid - New onset DM - Needs meter, strips #200/4 rf - lancets #200/4 rf - DX E11.65 1 each 0  . fluticasone (FLONASE) 50 MCG/ACT nasal spray Place 2 sprays into the nose daily. (Patient taking differently: Place 2 sprays into the nose daily. PRN) 16 g 6  . hydrochlorothiazide (MICROZIDE) 12.5 MG capsule Take 1 capsule (12.5 mg total) by mouth daily. 90 capsule 4  . lisinopril (PRINIVIL,ZESTRIL) 40 MG tablet Take 1 tablet (40 mg total) by mouth daily. 90 tablet 4  . metFORMIN (GLUCOPHAGE) 1000 MG tablet Take 1 tablet (1,000 mg total) by mouth 2 (two) times daily with a meal. 180 tablet 3  . ONE TOUCH ULTRA TEST test strip Check BS bid  0  . ONETOUCH DELICA LANCETS 79Y MISC   0  . azithromycin (ZITHROMAX) 250 MG tablet Day 1: Take 2 daily. Days 2-5: Take 1 daily. 6 tablet 0   No facility-administered medications prior to visit.    Allergies: No Known Allergies    Review of Systems: See HPI for pertinent ROS. All other ROS negative.    Physical Exam: Blood pressure 172/94, pulse 60, temperature 97.9 F (36.6 C), temperature source Oral, resp.  rate 18, weight 155 lb (70.308 kg)., Body mass index is 27.02 kg/(m^2). General:  WNWD WM. Appears in no acute distress. HEENT: Normocephalic, atraumatic, eyes without discharge, sclera non-icteric, nares are without discharge. Bilateral auditory canals clear, TM's are without perforation, pearly grey and translucent with reflective cone of light bilaterally. Oral cavity moist, posterior pharynx without exudate, erythema, peritonsillar abscess. No tenderness with percussion of frontal or maxillary sinuses bilaterally.  Neck: Supple. No thyromegaly. No lymphadenopathy. Lungs: Clear bilaterally to auscultation without wheezes, rales, or rhonchi. Breathing is unlabored. Heart: Regular rhythm. No murmurs, rubs, or gallops. Msk:  Strength and tone normal for age. Extremities/Skin: Warm and dry. Neuro: Alert and oriented X 3. Moves all extremities spontaneously. Gait is normal. CNII-XII grossly in tact. Psych:  Responds to questions appropriately with a normal affect.     ASSESSMENT AND PLAN:  64 y.o. year old male with  1. Bacterial respiratory infection He is to take antibiotic as directed. Also can use cough syrup as needed for cough suppressant. Follow-up if symptoms do not resolve within 1 week after completion of antibiotics. - azithromycin (ZITHROMAX) 250 MG tablet; Day 1: Take 2 daily. Days 2-5: Take 1 daily.  Dispense: 6 tablet; Refill: 0 - HYDROcodone-homatropine (HYCODAN) 5-1.5 MG/5ML syrup; Take 5 mLs  by mouth every 8 (eight) hours as needed for cough.  Dispense: 120 mL; Refill: 0  Also noted that his blood pressure is reading very high today. I rechecked blood pressure myself and I'm getting very similar reading on the right. I reviewed his last several office visits--reviewed his blood pressure readings at all of his visits--- all of his blood pressures were controlled and were not elevated. Will have him return for follow-up visit in one week to recheck his blood pressure once this  illness resolves. Discussed necessity of this and he voices understanding and agrees.  Marin Olp [place], [place], [facility] 12/28/2014 2:53 PM

## 2015-01-05 ENCOUNTER — Ambulatory Visit (INDEPENDENT_AMBULATORY_CARE_PROVIDER_SITE_OTHER): Payer: BLUE CROSS/BLUE SHIELD | Admitting: Family Medicine

## 2015-01-05 ENCOUNTER — Encounter: Payer: Self-pay | Admitting: Family Medicine

## 2015-01-05 VITALS — BP 204/120

## 2015-01-05 DIAGNOSIS — I10 Essential (primary) hypertension: Secondary | ICD-10-CM | POA: Diagnosis not present

## 2015-01-05 MED ORDER — CLONIDINE HCL 0.1 MG PO TABS
0.1000 mg | ORAL_TABLET | Freq: Three times a day (TID) | ORAL | Status: DC
Start: 2015-01-05 — End: 2015-06-01

## 2015-01-05 NOTE — Progress Notes (Signed)
Subjective:    Patient ID: Bradley Baldwin, male    DOB: [birth year], 64 y.o.   MRN: [id number]  HPI  Patient was recently seen at this office for an upper respiratory infection and was found to have an elevated blood pressure of 170/90. He is here today for recheck. My nurse checked his blood pressure and found to be 204/120 I personally rechecked his blood pressure after having the patient sit and wait for 30 minutes and found his blood pressure to be 210/120. Patient denies any chest pain shortness of breath or dyspnea on exertion. He denies any headache, blurry vision, slurred speech, or other neurologic deficit. He denies any trouble going to the restroom or any hematuria. There is no sign or evidence of any end-stage organ failure. Therefore there is no indication for emergent reduction in his blood pressure. Past Medical History  Diagnosis Date  . Hypertension     pcp   dr pickerd   [place]  . GERD (gastroesophageal reflux disease)   . Arthritis     "starting to get achy joints here and there" (11/28/2011)  . External bleeding hemorrhoids     "occasionally" (11/28/2011)  . Fatty liver disease, nonalcoholic   . Prediabetes   . Colon polyps    Past Surgical History  Procedure Laterality Date  . 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 aspirin EC 81 MG tablet Take 81 mg by mouth daily.    Marland Kitchen atenolol (TENORMIN) 50 MG tablet Take 1 tablet (50 mg total) by mouth daily. 90 tablet 4  . azithromycin (ZITHROMAX) 250 MG tablet Day 1: Take 2 daily. Days 2-5: Take 1 daily. 6 tablet 0  . Blood Glucose Monitoring Suppl (ONE TOUCH ULTRA SYSTEM KIT) W/DEVICE KIT Checks BS qd-bid - New onset DM - Needs meter,  strips #200/4 rf - lancets #200/4 rf - DX E11.65 1 each 0  . fluticasone (FLONASE) 50 MCG/ACT nasal spray Place 2 sprays into the nose daily. (Patient taking differently: Place 2 sprays into the nose daily. PRN) 16 g 6  . hydrochlorothiazide (MICROZIDE) 12.5 MG capsule Take 1 capsule (12.5 mg total) by mouth daily. 90 capsule 4  . HYDROcodone-homatropine (HYCODAN) 5-1.5 MG/5ML syrup Take 5 mLs by mouth every 8 (eight) hours as needed for cough. 120 mL 0  . lisinopril (PRINIVIL,ZESTRIL) 40 MG tablet Take 1 tablet (40 mg total) by mouth daily. 90 tablet 4  . metFORMIN (GLUCOPHAGE) 1000 MG tablet Take 1 tablet (1,000 mg total) by mouth 2 (two) times daily with a meal. 180 tablet 3  . ONE TOUCH ULTRA TEST test strip Check BS bid  0  . ONETOUCH DELICA LANCETS 51Z MISC   0   No current facility-administered medications on file prior to visit.   No Known Allergies Social History   Social History  . Marital Status: Married    Spouse Name: N/A  . Number of Children: N/A  . Years of Education: N/A   Occupational History  . Not on file.   Social History Main Topics  . Smoking status: Former Smoker -- 2.00 packs/day for 10 years    Types: Cigarettes  . Smokeless tobacco: Former Systems developer  Types: Chew     Comment: 11/28/2011 "quit smoking ~ 30 yr ago; chewed some then quit that"  . Alcohol Use: 3.6 oz/week    6 Cans of beer per week     Comment: 11/28/2011 "drink a few beers 3 times/wk; ~ 6pk total/wk"  . Drug Use: No  . Sexual Activity: Yes   Other Topics Concern  . Not on file   Social History Narrative     Review of Systems  All other systems reviewed and are negative.      Objective:   Physical Exam  Constitutional: He is oriented to person, place, and time. He appears well-developed and well-nourished. No distress.  Neck: Neck supple. No JVD present. No thyromegaly present.  Cardiovascular: Normal rate, regular rhythm and normal heart sounds.  Exam reveals no gallop and no  friction rub.   No murmur heard. Pulmonary/Chest: Effort normal and breath sounds normal. No respiratory distress. He has no wheezes. He has no rales.  Abdominal: Soft. Bowel sounds are normal. He exhibits no distension. There is no tenderness. There is no rebound and no guarding.  Lymphadenopathy:    He has no cervical adenopathy.  Neurological: He is alert and oriented to person, place, and time. He has normal reflexes. He displays normal reflexes. No cranial nerve deficit. He exhibits normal muscle tone. Coordination normal.  Skin: He is not diaphoretic.  Vitals reviewed.         Assessment & Plan:  Benign essential HTN - Plan: cloNIDine (CATAPRES) 0.1 MG tablet  Begin clonidine 0.1 mg by mouth 3 times a day and recheck blood pressure on Friday. I will make no other changes in his blood pressure medication at the present time. Patient is instructed to go to the emergency room should he develop any chest pain or shortness of breath or severe headache.

## 2015-01-05 NOTE — Progress Notes (Signed)
Pt here for nurse visit BP check.  BP was elevated last week when being seen for sick visit.  No history of elebvated BP's.  Provider said return in 1 week after better and have [place] recheck BP.  Here today  BP rt arm 206/112  Left arm 204/120.  Dr Dennard Schaumann made aware.  Instructed to have pt sit quietly for 5-10 min and recheck.

## 2015-01-08 ENCOUNTER — Ambulatory Visit (INDEPENDENT_AMBULATORY_CARE_PROVIDER_SITE_OTHER): Payer: BLUE CROSS/BLUE SHIELD | Admitting: Family Medicine

## 2015-01-08 ENCOUNTER — Encounter: Payer: Self-pay | Admitting: Family Medicine

## 2015-01-08 VITALS — BP 160/88 | HR 63 | Temp 97.9°F | Resp 16 | Wt 153.0 lb

## 2015-01-08 DIAGNOSIS — I10 Essential (primary) hypertension: Secondary | ICD-10-CM

## 2015-01-08 NOTE — Progress Notes (Signed)
Subjective:    Patient ID: Bradley Baldwin, male    DOB: 12/11/1950, 64 y.o.   MRN: [id number]  HPI  01/05/15 Patient was recently seen at this office for an upper respiratory infection and was found to have an elevated blood pressure of 170/90. He is here today for recheck. My nurse checked his blood pressure and found to be 204/120 I personally rechecked his blood pressure after having the patient sit and wait for 30 minutes and found his blood pressure to be 210/120. Patient denies any chest pain shortness of breath or dyspnea on exertion. He denies any headache, blurry vision, slurred speech, or other neurologic deficit. He denies any trouble going to the restroom or any hematuria. There is no sign or evidence of any end-stage organ failure. Therefore there is no indication for emergent reduction in his blood pressure.  At that time, my plan was: Begin clonidine 0.1 mg by mouth 3 times a day and recheck blood pressure on Friday. I will make no other changes in his blood pressure medication at the present time. Patient is instructed to go to the emergency room should he develop any chest pain or shortness of breath or severe headache.  01/08/15 Blood pressure has improved dramatically today. Patient has 160/88 in his left arm and 158/86 in his right arm. He denies any chest pain shortness of breath or dyspnea on exertion. He is tolerating the medication well Past Medical History  Diagnosis Date  . Hypertension     pcp   dr pickerd   [place]  . GERD (gastroesophageal reflux disease)   . Arthritis     "starting to get achy joints here and there" (11/28/2011)  . External bleeding hemorrhoids     "occasionally" (11/28/2011)  . Fatty liver disease, nonalcoholic   . Prediabetes   . Colon polyps    Past Surgical History  Procedure Laterality Date  . 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 aspirin EC 81 MG tablet Take 81 mg by mouth daily.    Marland Kitchen atenolol (TENORMIN) 50 MG tablet Take 1 tablet (50 mg total) by mouth daily. 90 tablet 4  . azithromycin (ZITHROMAX) 250 MG tablet Day 1: Take 2 daily. Days 2-5: Take 1 daily. 6 tablet 0  . Blood Glucose Monitoring Suppl (ONE TOUCH ULTRA SYSTEM KIT) W/DEVICE KIT Checks BS qd-bid - New onset DM - Needs meter, strips #200/4 rf - lancets #200/4 rf - DX E11.65 1 each 0  . cloNIDine (CATAPRES) 0.1 MG tablet Take 1 tablet (0.1 mg total) by mouth 3 (three) times daily. 90 tablet 3  . fluticasone (FLONASE) 50 MCG/ACT nasal spray Place 2 sprays into the nose daily. (Patient taking differently: Place 2 sprays into the nose daily. PRN) 16 g 6  . hydrochlorothiazide (MICROZIDE) 12.5 MG capsule Take 1 capsule (12.5 mg total) by mouth daily. 90 capsule 4  . HYDROcodone-homatropine (HYCODAN) 5-1.5 MG/5ML syrup Take 5 mLs by mouth every 8 (eight) hours as needed for cough. 120 mL 0  . lisinopril (PRINIVIL,ZESTRIL) 40 MG tablet Take 1 tablet (40 mg total) by mouth daily. 90 tablet 4  . metFORMIN (GLUCOPHAGE) 1000 MG tablet Take 1 tablet (1,000 mg  total) by mouth 2 (two) times daily with a meal. 180 tablet 3  . ONE TOUCH ULTRA TEST test strip Check BS bid  0  . ONETOUCH DELICA LANCETS 00F MISC   0   No current facility-administered medications on file prior to visit.   No Known Allergies Social History   Social History  . Marital Status: Married    Spouse Name: N/A  . Number of Children: N/A  . Years of Education: N/A   Occupational History  . Not on file.   Social History Main Topics  . Smoking status: Former Smoker -- 2.00 packs/day for 10 years    Types: Cigarettes  . Smokeless tobacco: Former Systems developer    Types: Chew     Comment: 11/28/2011 "quit smoking ~ 30 yr  ago; chewed some then quit that"  . Alcohol Use: 3.6 oz/week    6 Cans of beer per week     Comment: 11/28/2011 "drink a few beers 3 times/wk; ~ 6pk total/wk"  . Drug Use: No  . Sexual Activity: Yes   Other Topics Concern  . Not on file   Social History Narrative     Review of Systems  All other systems reviewed and are negative.      Objective:   Physical Exam  Constitutional: He is oriented to person, place, and time. He appears well-developed and well-nourished. No distress.  Neck: Neck supple. No JVD present. No thyromegaly present.  Cardiovascular: Normal rate, regular rhythm and normal heart sounds.  Exam reveals no gallop and no friction rub.   No murmur heard. Pulmonary/Chest: Effort normal and breath sounds normal. No respiratory distress. He has no wheezes. He has no rales.  Abdominal: Soft. Bowel sounds are normal. He exhibits no distension. There is no tenderness. There is no rebound and no guarding.  Lymphadenopathy:    He has no cervical adenopathy.  Neurological: He is alert and oriented to person, place, and time. He has normal reflexes. No cranial nerve deficit. He exhibits normal muscle tone. Coordination normal.  Skin: He is not diaphoretic.  Vitals reviewed.         Assessment & Plan:  Benign essential HTN - Plan: Basic Metabolic Panel  I've asked the patient check his blood pressure frequently over the weekend to give the medication more time to take effect. If by Monday his blood pressure is not at goal, I will increase clonidine to 0.2 mg by mouth 3 times a day. I would like to see the patient back in one month as he is going on vacation to [place]. At that time I will try to switch medication to a more convenient long-term option other than clonidine

## 2015-01-09 LAB — BASIC METABOLIC PANEL
BUN: 19 mg/dL (ref 7–25)
CHLORIDE: 94 mmol/L — AB (ref 98–110)
CO2: 26 mmol/L (ref 20–31)
Calcium: 9.8 mg/dL (ref 8.6–10.3)
Creat: 0.9 mg/dL (ref 0.70–1.25)
Glucose, Bld: 124 mg/dL — ABNORMAL HIGH (ref 70–99)
POTASSIUM: 4.3 mmol/L (ref 3.5–5.3)
Sodium: 134 mmol/L — ABNORMAL LOW (ref 135–146)

## 2015-01-11 ENCOUNTER — Encounter: Payer: Self-pay | Admitting: Family Medicine

## 2015-01-13 ENCOUNTER — Telehealth: Payer: Self-pay | Admitting: Family Medicine

## 2015-01-13 ENCOUNTER — Encounter: Payer: Self-pay | Admitting: Family Medicine

## 2015-01-13 NOTE — Telephone Encounter (Signed)
Pt turned in BP readings: 155/91,150/81,138/78,137/81,154/75,145/85  Per WTP still slightly high but will not change medication until pt returns from trip to [place]. Pt aware via mychart.

## 2015-01-26 ENCOUNTER — Encounter: Payer: Self-pay | Admitting: Family Medicine

## 2015-01-26 ENCOUNTER — Ambulatory Visit (INDEPENDENT_AMBULATORY_CARE_PROVIDER_SITE_OTHER): Payer: BLUE CROSS/BLUE SHIELD | Admitting: Family Medicine

## 2015-01-26 VITALS — BP 130/70 | HR 60 | Temp 98.0°F | Resp 12 | Wt 151.0 lb

## 2015-01-26 DIAGNOSIS — I10 Essential (primary) hypertension: Secondary | ICD-10-CM | POA: Diagnosis not present

## 2015-01-26 MED ORDER — AMLODIPINE BESYLATE 10 MG PO TABS: 10.0000 mg | ORAL_TABLET | Freq: Every day | ORAL | Status: DC

## 2015-01-26 MED ORDER — ZOSTER VACCINE LIVE 19400 UNT/0.65ML ~~LOC~~ SOLR: 0.6500 mL | Freq: Once | SUBCUTANEOUS | Status: DC

## 2015-01-26 NOTE — Progress Notes (Signed)
Subjective:    Patient ID: Bradley Baldwin, male    DOB: Jan 10, 1951, 64 y.o.   MRN: [id number]  HPI  01/05/15 Patient was recently seen at this office for an upper respiratory infection and was found to have an elevated blood pressure of 170/90. He is here today for recheck. My nurse checked his blood pressure and found to be 204/120 I personally rechecked his blood pressure after having the patient sit and wait for 30 minutes and found his blood pressure to be 210/120. Patient denies any chest pain shortness of breath or dyspnea on exertion. He denies any headache, blurry vision, slurred speech, or other neurologic deficit. He denies any trouble going to the restroom or any hematuria. There is no sign or evidence of any end-stage organ failure. Therefore there is no indication for emergent reduction in his blood pressure.  At that time, my plan was: Begin clonidine 0.1 mg by mouth 3 times a day and recheck blood pressure on Friday. I will make no other changes in his blood pressure medication at the present time. Patient is instructed to go to the emergency room should he develop any chest pain or shortness of breath or severe headache.  01/08/15 Blood pressure has improved dramatically today. Patient has 160/88 in his left arm and 158/86 in his right arm. He denies any chest pain shortness of breath or dyspnea on exertion. He is tolerating the medication well.  At that time, my plan was: I've asked the patient check his blood pressure frequently over the weekend to give the medication more time to take effect. If by Monday his blood pressure is not at goal, I will increase clonidine to 0.2 mg by mouth 3 times a day. I would like to see the patient back in one month as he is going on vacation to [place]. At that time I will try to switch medication to a more convenient long-term option other than clonidine.  01/26/15 Patient continues to remain on clonidine 0.1 mg by mouth 3 times a day. His blood  pressure is ranging between 125 and 165 over 80s. His average blood pressures between 140 and 150/80. His biggest complaint is the dry mouth the clonidine causes and also the intravenous is having to take a medication 3 times a day. He denies any chest pain or shortness of breath or dyspnea on exertion. Past Medical History  Diagnosis Date  . Hypertension     pcp   dr pickerd   [place]  . GERD (gastroesophageal reflux disease)   . Arthritis     "starting to get achy joints here and there" (11/28/2011)  . External bleeding hemorrhoids     "occasionally" (11/28/2011)  . Fatty liver disease, nonalcoholic   . Prediabetes   . Colon polyps    Past Surgical History  Procedure Laterality Date  . 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 Total hip arthroplasty  11/28/2011    Procedure: TOTAL HIP ARTHROPLASTY ANTERIOR APPROACH;  Surgeon: Hessie Dibble, MD;  Location: [facility];  Service: Orthopedics;  Laterality: Right;   Current Outpatient Prescriptions on File Prior to Visit  Medication Sig Dispense Refill  . aspirin EC 81 MG tablet Take 81 mg by mouth daily.    Marland Kitchen atenolol (TENORMIN) 50 MG tablet Take 1 tablet (50 mg total) by mouth daily. 90 tablet 4  . azithromycin (ZITHROMAX) 250 MG tablet Day 1: Take 2 daily. Days 2-5: Take 1 daily. 6 tablet  0  . Blood Glucose Monitoring Suppl (ONE TOUCH ULTRA SYSTEM KIT) W/DEVICE KIT Checks BS qd-bid - New onset DM - Needs meter, strips #200/4 rf - lancets #200/4 rf - DX E11.65 1 each 0  . cloNIDine (CATAPRES) 0.1 MG tablet Take 1 tablet (0.1 mg total) by mouth 3 (three) times daily. 90 tablet 3  . fluticasone (FLONASE) 50 MCG/ACT nasal spray Place 2 sprays into the nose daily. (Patient taking differently: Place 2 sprays into the nose daily. PRN) 16 g 6  . hydrochlorothiazide (MICROZIDE) 12.5 MG capsule Take 1 capsule (12.5 mg total) by mouth daily. 90 capsule 4  . HYDROcodone-homatropine (HYCODAN) 5-1.5 MG/5ML syrup Take 5 mLs by mouth every 8  (eight) hours as needed for cough. 120 mL 0  . lisinopril (PRINIVIL,ZESTRIL) 40 MG tablet Take 1 tablet (40 mg total) by mouth daily. 90 tablet 4  . metFORMIN (GLUCOPHAGE) 1000 MG tablet Take 1 tablet (1,000 mg total) by mouth 2 (two) times daily with a meal. 180 tablet 3  . ONE TOUCH ULTRA TEST test strip Check BS bid  0  . ONETOUCH DELICA LANCETS 21H MISC   0   No current facility-administered medications on file prior to visit.   No Known Allergies Social History   Social History  . Marital Status: Married    Spouse Name: N/A  . Number of Children: N/A  . Years of Education: N/A   Occupational History  . Not on file.   Social History Main Topics  . Smoking status: Former Smoker -- 2.00 packs/day for 10 years    Types: Cigarettes  . Smokeless tobacco: Former Systems developer    Types: Chew     Comment: 11/28/2011 "quit smoking ~ 30 yr ago; chewed some then quit that"  . Alcohol Use: 3.6 oz/week    6 Cans of beer per week     Comment: 11/28/2011 "drink a few beers 3 times/wk; ~ 6pk total/wk"  . Drug Use: No  . Sexual Activity: Yes   Other Topics Concern  . Not on file   Social History Narrative     Review of Systems  All other systems reviewed and are negative.      Objective:   Physical Exam  Constitutional: He is oriented to person, place, and time. He appears well-developed and well-nourished. No distress.  Neck: Neck supple. No JVD present. No thyromegaly present.  Cardiovascular: Normal rate, regular rhythm and normal heart sounds.  Exam reveals no gallop and no friction rub.   No murmur heard. Pulmonary/Chest: Effort normal and breath sounds normal. No respiratory distress. He has no wheezes. He has no rales.  Abdominal: Soft. Bowel sounds are normal. He exhibits no distension. There is no tenderness. There is no rebound and no guarding.  Lymphadenopathy:    He has no cervical adenopathy.  Neurological: He is alert and oriented to person, place, and time. He has  normal reflexes. No cranial nerve deficit. He exhibits normal muscle tone. Coordination normal.  Skin: He is not diaphoretic.  Vitals reviewed.         Assessment & Plan:  Benign essential HTN - Plan: amLODipine (NORVASC) 10 MG tablet  Wean off clonidine. Decrease clonidine to 0.1 mg by mouth daily at bedtime and begin amlodipine 10 mg by mouth daily. In 2 weeks discontinue clonidine altogether once amlodipine has had an opportunity to reach full effect and then monitor his blood pressure solely on amlodipine. Recheck values with me in one month. If blood pressures  greater than 140/90 on average, I will supplement amlodipine with doxazosin.

## 2015-04-28 ENCOUNTER — Other Ambulatory Visit: Payer: Self-pay | Admitting: Family Medicine

## 2015-04-28 MED ORDER — BAYER CONTOUR NEXT MONITOR W/DEVICE KIT: PACK | Status: DC

## 2015-05-18 ENCOUNTER — Other Ambulatory Visit: Payer: Self-pay | Admitting: Family Medicine

## 2015-05-18 NOTE — Telephone Encounter (Signed)
Refill appropriate and filled per protocol. 

## 2015-05-27 ENCOUNTER — Other Ambulatory Visit: Payer: BLUE CROSS/BLUE SHIELD

## 2015-05-27 DIAGNOSIS — R7303 Prediabetes: Secondary | ICD-10-CM

## 2015-05-27 DIAGNOSIS — Z13220 Encounter for screening for lipoid disorders: Secondary | ICD-10-CM

## 2015-05-27 DIAGNOSIS — Z125 Encounter for screening for malignant neoplasm of prostate: Secondary | ICD-10-CM

## 2015-05-27 DIAGNOSIS — K760 Fatty (change of) liver, not elsewhere classified: Secondary | ICD-10-CM

## 2015-05-27 LAB — LIPID PANEL
CHOL/HDL RATIO: 2.7 ratio (ref <=5.0)
CHOLESTEROL: 180 mg/dL (ref 125–200)
HDL: 66 mg/dL (ref >=40)
LDL CALC: 99 mg/dL (ref <130)
Triglycerides: 77 mg/dL (ref <150)
VLDL: 15 mg/dL (ref <30)

## 2015-05-27 LAB — CBC WITH DIFFERENTIAL/PLATELET
BASOS PCT: 1 %
Basophils Absolute: 73 cells/uL (ref 0–200)
Eosinophils Absolute: 219 cells/uL (ref 15–500)
Eosinophils Relative: 3 %
HCT: 38.5 % (ref 38.5–50.0)
Hemoglobin: 13.4 g/dL (ref 13.0–17.0)
LYMPHS PCT: 45 %
Lymphs Abs: 3285 cells/uL (ref 850–3900)
MCH: 34.3 pg — ABNORMAL HIGH (ref 27.0–33.0)
MCHC: 34.8 g/dL (ref 32.0–36.0)
MCV: 98.5 fL (ref 80.0–100.0)
MONO ABS: 584 {cells}/uL (ref 200–950)
MPV: 9.4 fL (ref 7.5–12.5)
Monocytes Relative: 8 %
NEUTROS PCT: 43 %
Neutro Abs: 3139 cells/uL (ref 1500–7800)
PLATELETS: 228 10*3/uL (ref 140–400)
RBC: 3.91 MIL/uL — AB (ref 4.20–5.80)
RDW: 12.5 % (ref 11.0–15.0)
WBC: 7.3 10*3/uL (ref 3.8–10.8)

## 2015-05-27 LAB — HEMOGLOBIN A1C
HEMOGLOBIN A1C: 5.1 % (ref <5.7)
MEAN PLASMA GLUCOSE: 100 mg/dL

## 2015-05-27 LAB — COMPLETE METABOLIC PANEL WITH GFR
ALT: 25 U/L (ref 9–46)
AST: 30 U/L (ref 10–35)
Albumin: 4.4 g/dL (ref 3.6–5.1)
Alkaline Phosphatase: 39 U/L — ABNORMAL LOW (ref 40–115)
BUN: 16 mg/dL (ref 7–25)
CHLORIDE: 98 mmol/L (ref 98–110)
CO2: 27 mmol/L (ref 20–31)
CREATININE: 0.97 mg/dL (ref 0.70–1.25)
Calcium: 9.5 mg/dL (ref 8.6–10.3)
GFR, Est African American: >89 mL/min (ref >=60)
GFR, Est Non African American: 82 mL/min (ref >=60)
GLUCOSE: 112 mg/dL — AB (ref 70–99)
POTASSIUM: 4.5 mmol/L (ref 3.5–5.3)
SODIUM: 135 mmol/L (ref 135–146)
Total Bilirubin: 0.7 mg/dL (ref 0.2–1.2)
Total Protein: 6.6 g/dL (ref 6.1–8.1)

## 2015-05-28 LAB — PSA: PSA: 0.16 ng/mL (ref <=4.00)

## 2015-06-01 ENCOUNTER — Ambulatory Visit (INDEPENDENT_AMBULATORY_CARE_PROVIDER_SITE_OTHER): Payer: BLUE CROSS/BLUE SHIELD | Admitting: Family Medicine

## 2015-06-01 ENCOUNTER — Encounter: Payer: Self-pay | Admitting: Family Medicine

## 2015-06-01 VITALS — BP 132/74 | HR 80 | Temp 98.5°F | Resp 16 | Ht 63.5 in | Wt 156.0 lb

## 2015-06-01 DIAGNOSIS — Z0000 Encounter for general adult medical examination without abnormal findings: Secondary | ICD-10-CM | POA: Diagnosis not present

## 2015-06-01 NOTE — Progress Notes (Signed)
Subjective:    Patient ID: Bradley Baldwin, male    DOB: December 07, 1950, 65 y.o.   MRN: [id number]  HPI  Patient is here today for complete physical exam. His colonoscopy was performed in 2016 and was significant for 4 tubular adenomas. He is scheduled repeat colonoscopy in 2019. His Blood sugar is outstanding. His average blood sugar is averaging around 120-125. His hemoglobin A1c is excellent. His blood pressure has been averaging in the 130's/80's.  The only complaint the patient has some erectile dysfunction. His PSA is undetectable. His immunizations are up-to-date except for the shingles vaccine. We have previously checked the patient for hepatitis C prior to joining epic. Past Medical History  Diagnosis Date  . Hypertension     pcp   dr pickerd   [place]  . GERD (gastroesophageal reflux disease)   . Arthritis     "starting to get achy joints here and there" (11/28/2011)  . External bleeding hemorrhoids     "occasionally" (11/28/2011)  . Fatty liver disease, nonalcoholic   . Prediabetes   . Colon polyps    Past Surgical History  Procedure Laterality Date  . 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 Total hip arthroplasty  11/28/2011    Procedure: TOTAL HIP ARTHROPLASTY ANTERIOR APPROACH;  Surgeon: Hessie Dibble, MD;  Location: [facility];  Service: Orthopedics;  Laterality: Right;   Current Outpatient Prescriptions on File Prior to Visit  Medication Sig Dispense Refill  . amLODipine (NORVASC) 10 MG tablet Take 1 tablet (10 mg total) by mouth daily. 90 tablet 3  . aspirin EC 81 MG tablet Take 81 mg by mouth daily.    Marland Kitchen atenolol (TENORMIN) 50 MG tablet Take 1 tablet (50 mg total) by mouth daily. 90 tablet 4  . Blood Glucose Monitoring Suppl (BAYER CONTOUR NEXT MONITOR) w/Device KIT Checks BS qd-bid - Needs meter, strips #200/4 rf - lancets #200/4 rf - DX E11.65 1 kit 0  . fluticasone (FLONASE) 50 MCG/ACT nasal spray Place 2 sprays into the nose daily. (Patient taking  differently: Place 2 sprays into the nose daily. PRN) 16 g 6  . hydrochlorothiazide (MICROZIDE) 12.5 MG capsule Take 1 capsule (12.5 mg total) by mouth daily. 90 capsule 4  . lisinopril (PRINIVIL,ZESTRIL) 40 MG tablet Take 1 tablet (40 mg total) by mouth daily. 90 tablet 4  . metFORMIN (GLUCOPHAGE) 1000 MG tablet TAKE 1 TABLET BY MOUTH 2 TIMES DAILY WITH MEALS 180 tablet 3   No current facility-administered medications on file prior to visit.   No Known Allergies Social History   Social History  . Marital Status: Married    Spouse Name: N/A  . Number of Children: N/A  . Years of Education: N/A   Occupational History  . Not on file.   Social History Main Topics  . Smoking status: Former Smoker -- 2.00 packs/day for 10 years    Types: Cigarettes  . Smokeless tobacco: Former Systems developer    Types: Chew     Comment: 11/28/2011 "quit smoking ~ 30 yr ago; chewed some then quit that"  . Alcohol Use: 3.6 oz/week    6 Cans of beer per week     Comment: 11/28/2011 "drink a few beers 3 times/wk; ~ 6pk total/wk"  . Drug Use: No  . Sexual Activity: Yes   Other Topics Concern  . Not on file   Social History Narrative   No family history on file.' Lab on 05/27/2015  Component Date Value  Ref Range Status  . WBC 05/27/2015 7.3  3.8 - 10.8 K/uL Final  . RBC 05/27/2015 3.91* 4.20 - 5.80 MIL/uL Final  . Hemoglobin 05/27/2015 13.4  13.0 - 17.0 g/dL Final  . HCT 05/27/2015 38.5  38.5 - 50.0 % Final  . MCV 05/27/2015 98.5  80.0 - 100.0 fL Final  . MCH 05/27/2015 34.3* 27.0 - 33.0 pg Final  . MCHC 05/27/2015 34.8  32.0 - 36.0 g/dL Final  . RDW 05/27/2015 12.5  11.0 - 15.0 % Final  . Platelets 05/27/2015 228  140 - 400 K/uL Final  . MPV 05/27/2015 9.4  7.5 - 12.5 fL Final  . Neutro Abs 05/27/2015 3139  1500 - 7800 cells/uL Final  . Lymphs Abs 05/27/2015 3285  850 - 3900 cells/uL Final  . Monocytes Absolute 05/27/2015 584  200 - 950 cells/uL Final  . Eosinophils Absolute 05/27/2015 219  15 - 500  cells/uL Final  . Basophils Absolute 05/27/2015 73  0 - 200 cells/uL Final  . Neutrophils Relative % 05/27/2015 43   Final  . Lymphocytes Relative 05/27/2015 45   Final  . Monocytes Relative 05/27/2015 8   Final  . Eosinophils Relative 05/27/2015 3   Final  . Basophils Relative 05/27/2015 1   Final  . Smear Review 05/27/2015 Criteria for review not met   Final   ** Please note change in unit of measure and reference range(s). **  . Sodium 05/27/2015 135  135 - 146 mmol/L Final  . Potassium 05/27/2015 4.5  3.5 - 5.3 mmol/L Final  . Chloride 05/27/2015 98  98 - 110 mmol/L Final  . CO2 05/27/2015 27  20 - 31 mmol/L Final  . Glucose, Bld 05/27/2015 112* 70 - 99 mg/dL Final  . BUN 05/27/2015 16  7 - 25 mg/dL Final  . Creat 05/27/2015 0.97  0.70 - 1.25 mg/dL Final  . Total Bilirubin 05/27/2015 0.7  0.2 - 1.2 mg/dL Final  . Alkaline Phosphatase 05/27/2015 39* 40 - 115 U/L Final  . AST 05/27/2015 30  10 - 35 U/L Final  . ALT 05/27/2015 25  9 - 46 U/L Final  . Total Protein 05/27/2015 6.6  6.1 - 8.1 g/dL Final  . Albumin 05/27/2015 4.4  3.6 - 5.1 g/dL Final  . Calcium 05/27/2015 9.5  8.6 - 10.3 mg/dL Final  . GFR, Est African American 05/27/2015 >89  >=60 mL/min Final  . GFR, Est Non African American 05/27/2015 82  >=60 mL/min Final   Comment:   The estimated GFR is a calculation valid for adults (>=33 years old) that uses the CKD-EPI algorithm to adjust for age and sex. It is   not to be used for children, pregnant women, hospitalized patients,    patients on dialysis, or with rapidly changing kidney function. According to the NKDEP, eGFR >89 is normal, 60-89 shows mild impairment, 30-59 shows moderate impairment, 15-29 shows severe impairment and <15 is ESRD.     Marland Kitchen Cholesterol 05/27/2015 180  125 - 200 mg/dL Final  . Triglycerides 05/27/2015 77  <150 mg/dL Final  . HDL 05/27/2015 66  >=40 mg/dL Final  . Total CHOL/HDL Ratio 05/27/2015 2.7  <=5.0 Ratio Final  . VLDL 05/27/2015 15  <30  mg/dL Final  . LDL Cholesterol 05/27/2015 99  <130 mg/dL Final   Comment:   Total Cholesterol/HDL Ratio:CHD Risk                        Coronary  Heart Disease Risk Table                                        Men       Women          1/2 Average Risk              3.4        3.3              Average Risk              5.0        4.4           2X Average Risk              9.6        7.1           3X Average Risk             23.4       11.0 Use the calculated Patient Ratio above and the CHD Risk table  to determine the patient's CHD Risk.   . Hgb A1c MFr Bld 05/27/2015 5.1  <5.7 % Final   Comment:   For the purpose of screening for the presence of diabetes:   <5.7%       Consistent with the absence of diabetes 5.7-6.4 %   Consistent with increased risk for diabetes (prediabetes) >=6.5 %     Consistent with diabetes   This assay result is consistent with a decreased risk of diabetes.   Currently, no consensus exists regarding use of hemoglobin A1c for diagnosis of diabetes in children.   According to American Diabetes Association (ADA) guidelines, hemoglobin A1c <7.0% represents optimal control in non-pregnant diabetic patients. Different metrics may apply to specific patient populations. Standards of Medical Care in Diabetes (ADA).     . Mean Plasma Glucose 05/27/2015 100   Final  . PSA 05/27/2015 0.16  <=4.00 ng/mL Final   Comment: Test Methodology: ECLIA PSA (Electrochemiluminescence Immunoassay)   For PSA values from 2.5-4.0, particularly in younger men <43 years old, the AUA and NCCN suggest testing for % Free PSA (3515) and evaluation of the rate of increase in PSA (PSA velocity).      Review of Systems  All other systems reviewed and are negative.      Objective:   Physical Exam  Constitutional: He is oriented to person, place, and time. He appears well-developed and well-nourished. No distress.  HENT:  Head: Normocephalic and atraumatic.  Right Ear: External  ear normal.  Left Ear: External ear normal.  Nose: Nose normal.  Mouth/Throat: Oropharynx is clear and moist. No oropharyngeal exudate.  Eyes: Conjunctivae and EOM are normal. Pupils are equal, round, and reactive to light. Right eye exhibits no discharge. Left eye exhibits no discharge. No scleral icterus.  Neck: Normal range of motion. Neck supple. No JVD present. No tracheal deviation present. No thyromegaly present.  Cardiovascular: Normal rate, regular rhythm, normal heart sounds and intact distal pulses.  Exam reveals no gallop and no friction rub.   No murmur heard. Pulmonary/Chest: Effort normal and breath sounds normal. No stridor. No respiratory distress. He has no wheezes. He has no rales. He exhibits no tenderness.  Abdominal: Soft. Bowel sounds are normal. He exhibits no distension and no mass. There is no tenderness. There is no rebound and no  guarding.  Genitourinary: Rectum normal and prostate normal.  Musculoskeletal: Normal range of motion. He exhibits no edema or tenderness.  Lymphadenopathy:    He has no cervical adenopathy.  Neurological: He is alert and oriented to person, place, and time. He has normal reflexes. No cranial nerve deficit. He exhibits normal muscle tone. Coordination normal.  Skin: Skin is warm. No rash noted. He is not diaphoretic. No erythema. No pallor.  Psychiatric: He has a normal mood and affect. His behavior is normal. Judgment and thought content normal.  Vitals reviewed.         Assessment & Plan:  Gen med exam Physical exam is normal. I recommended a shingles vaccine. Blood pressures excellent. Diabetes test is outstanding. Cholesterol is excellent. We will try Viagra grams daily as needed for erectile dysfunction. Patient will return for the shingles vaccine and physicians will pay for it. He will be due for Prevnar 13 after 65. Colonoscopy is up-to-date. PSA is undetectable

## 2015-07-03 ENCOUNTER — Other Ambulatory Visit: Payer: Self-pay | Admitting: Family Medicine

## 2015-08-25 ENCOUNTER — Other Ambulatory Visit: Payer: Self-pay | Admitting: Family Medicine

## 2015-08-25 NOTE — Telephone Encounter (Signed)
Refill appropriate and filled per protocol. 

## 2015-09-17 ENCOUNTER — Ambulatory Visit (INDEPENDENT_AMBULATORY_CARE_PROVIDER_SITE_OTHER): Payer: BLUE CROSS/BLUE SHIELD | Admitting: Family Medicine

## 2015-09-17 VITALS — BP 118/70 | HR 62 | Temp 98.2°F | Resp 16 | Wt 160.0 lb

## 2015-09-17 DIAGNOSIS — L729 Follicular cyst of the skin and subcutaneous tissue, unspecified: Secondary | ICD-10-CM | POA: Diagnosis not present

## 2015-09-17 NOTE — Progress Notes (Signed)
Subjective:    Patient ID: Bradley Baldwin, male    DOB: 11-10-1950, 65 y.o.   MRN: [id number]  HPI  Patient has a mass on the right side of his occiput.  It has been there many years. Is approximately 2.5 cm x 2 cm in diameter. It is fluctuant and mobile consistent with a cyst. He is requesting excision. There is no erythema or evidence of an abscess Past Medical History:  Diagnosis Date  . Arthritis    "starting to get achy joints here and there" (11/28/2011)  . Colon polyps   . External bleeding hemorrhoids    "occasionally" (11/28/2011)  . Fatty liver disease, nonalcoholic   . GERD (gastroesophageal reflux disease)   . Hypertension    pcp   dr pickerd   [place]  . Prediabetes    Past Surgical History:  Procedure Laterality Date  . 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 TOTAL HIP ARTHROPLASTY  11/28/2011   Procedure: TOTAL HIP ARTHROPLASTY ANTERIOR APPROACH;  Surgeon: Hessie Dibble, MD;  Location: [facility];  Service: Orthopedics;  Laterality: Right;   Current Outpatient Prescriptions on File Prior to Visit  Medication Sig Dispense Refill  . amLODipine (NORVASC) 10 MG tablet Take 1 tablet (10 mg total) by mouth daily. 90 tablet 3  . aspirin EC 81 MG tablet Take 81 mg by mouth daily.    Marland Kitchen atenolol (TENORMIN) 50 MG tablet TAKE 1 TABLET BY MOUTH DAILY. 90 tablet 4  . Blood Glucose Monitoring Suppl (BAYER CONTOUR NEXT MONITOR) w/Device KIT Checks BS qd-bid - Needs meter, strips #200/4 rf - lancets #200/4 rf - DX E11.65 1 kit 0  . fluticasone (FLONASE) 50 MCG/ACT nasal spray Place 2 sprays into the nose daily. (Patient taking differently: Place 2 sprays into the nose daily. PRN) 16 g 6  . hydrochlorothiazide (MICROZIDE) 12.5 MG capsule TAKE 1 CAPSULE BY MOUTH DAILY. 90 capsule 4  . lisinopril (PRINIVIL,ZESTRIL) 40 MG tablet TAKE 1 TABLET BY MOUTH DAILY. 90 tablet 4  . metFORMIN (GLUCOPHAGE) 1000 MG tablet TAKE 1 TABLET BY MOUTH 2 TIMES DAILY WITH MEALS 180 tablet 3   No  current facility-administered medications on file prior to visit.    No Known Allergies Social History   Social History  . Marital status: Married    Spouse name: N/A  . Number of children: N/A  . Years of education: N/A   Occupational History  . Not on file.   Social History Main Topics  . Smoking status: Former Smoker    Packs/day: 2.00    Years: 10.00    Types: Cigarettes  . Smokeless tobacco: Former Systems developer    Types: Chew     Comment: 11/28/2011 "quit smoking ~ 30 yr ago; chewed some then quit that"  . Alcohol use 3.6 oz/week    6 Cans of beer per week     Comment: 11/28/2011 "drink a few beers 3 times/wk; ~ 6pk total/wk"  . Drug use: No  . Sexual activity: Yes   Other Topics Concern  . Not on file   Social History Narrative  . No narrative on file      Review of Systems  All other systems reviewed and are negative.      Objective:   Physical Exam  Cardiovascular: Normal rate and regular rhythm.   Pulmonary/Chest: Effort normal and breath sounds normal.  Vitals reviewed.  2.5 cm x 2 cm cyst on his occiput  Assessment & Plan:  Scalp cyst  Area was anesthetized with 0.1% lidocaine with epinephrine. It was then plans thoroughly with Betadine. The hair was then removed from the overlying cyst using traction and Neosporin. The skin over the cyst was then cleansed thoroughly again with Betadine and the area was draped in sterile fashion. A 3 cm x 2 cm elliptical excision was performed of the entire mass down to the underlying fascia. The edges of the fascia were then approximated using 3 simple interrupted 3-0 Vicryl sutures. The skin edges were then approximated using 5 simple interrupted 4-0 Ethilon sutures. There was minimal blood loss. The surgical site was then cleansed thoroughly with alcohol. Wound care was discussed. Suture removal in 7-10 days

## 2015-09-27 ENCOUNTER — Ambulatory Visit (INDEPENDENT_AMBULATORY_CARE_PROVIDER_SITE_OTHER): Payer: BLUE CROSS/BLUE SHIELD | Admitting: Family Medicine

## 2015-09-27 DIAGNOSIS — Z4802 Encounter for removal of sutures: Secondary | ICD-10-CM

## 2015-09-27 MED ORDER — SILDENAFIL CITRATE 20 MG PO TABS: 60.0000 mg | ORAL_TABLET | Freq: Every day | ORAL | 0 refills | Status: DC | PRN

## 2015-09-27 NOTE — Progress Notes (Signed)
Subjective:    Patient ID: Bradley Baldwin, male    DOB: [birth year], 65 y.o.   MRN: [id number]  HPI  09/17/15 Patient has a mass on the right side of his occiput.  It has been there many years. Is approximately 2.5 cm x 2 cm in diameter. It is fluctuant and mobile consistent with a cyst. He is requesting excision. There is no erythema or evidence of an abscess.  At that time, my plan was: Area was anesthetized with 0.1% lidocaine with epinephrine. It was then plans thoroughly with Betadine. The hair was then removed from the overlying cyst using traction and Neosporin. The skin over the cyst was then cleansed thoroughly again with Betadine and the area was draped in sterile fashion. A 3 cm x 2 cm elliptical excision was performed of the entire mass down to the underlying fascia. The edges of the fascia were then approximated using 3 simple interrupted 3-0 Vicryl sutures. The skin edges were then approximated using 5 simple interrupted 4-0 Ethilon sutures. There was minimal blood loss. The surgical site was then cleansed thoroughly with alcohol. Wound care was discussed. Suture removal in 7-10 days  09/27/15 Here for suture removal.  No evidence of cellulitis or complications.  Wound appears to be healing well. Past Medical History:  Diagnosis Date  . Arthritis    "starting to get achy joints here and there" (11/28/2011)  . Colon polyps   . External bleeding hemorrhoids    "occasionally" (11/28/2011)  . Fatty liver disease, nonalcoholic   . GERD (gastroesophageal reflux disease)   . Hypertension    pcp   dr pickerd   [place]  . Prediabetes    Past Surgical History:  Procedure Laterality Date  . 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 TOTAL HIP ARTHROPLASTY  11/28/2011   Procedure: TOTAL HIP ARTHROPLASTY ANTERIOR APPROACH;  Surgeon: Hessie Dibble, MD;  Location: [facility];  Service: Orthopedics;  Laterality: Right;   Current Outpatient Prescriptions on File Prior to Visit    Medication Sig Dispense Refill  . amLODipine (NORVASC) 10 MG tablet Take 1 tablet (10 mg total) by mouth daily. 90 tablet 3  . aspirin EC 81 MG tablet Take 81 mg by mouth daily.    Marland Kitchen atenolol (TENORMIN) 50 MG tablet TAKE 1 TABLET BY MOUTH DAILY. 90 tablet 4  . Blood Glucose Monitoring Suppl (BAYER CONTOUR NEXT MONITOR) w/Device KIT Checks BS qd-bid - Needs meter, strips #200/4 rf - lancets #200/4 rf - DX E11.65 1 kit 0  . fluticasone (FLONASE) 50 MCG/ACT nasal spray Place 2 sprays into the nose daily. (Patient taking differently: Place 2 sprays into the nose daily. PRN) 16 g 6  . hydrochlorothiazide (MICROZIDE) 12.5 MG capsule TAKE 1 CAPSULE BY MOUTH DAILY. 90 capsule 4  . lisinopril (PRINIVIL,ZESTRIL) 40 MG tablet TAKE 1 TABLET BY MOUTH DAILY. 90 tablet 4  . metFORMIN (GLUCOPHAGE) 1000 MG tablet TAKE 1 TABLET BY MOUTH 2 TIMES DAILY WITH MEALS 180 tablet 3   No current facility-administered medications on file prior to visit.    No Known Allergies Social History   Social History  . Marital status: Married    Spouse name: N/A  . Number of children: N/A  . Years of education: N/A   Occupational History  . Not on file.   Social History Main Topics  . Smoking status: Former Smoker    Packs/day: 2.00    Years: 10.00    Types: Cigarettes  .  Smokeless tobacco: Former Systems developer    Types: Chew     Comment: 11/28/2011 "quit smoking ~ 30 yr ago; chewed some then quit that"  . Alcohol use 3.6 oz/week    6 Cans of beer per week     Comment: 11/28/2011 "drink a few beers 3 times/wk; ~ 6pk total/wk"  . Drug use: No  . Sexual activity: Yes   Other Topics Concern  . Not on file   Social History Narrative  . No narrative on file      Review of Systems  All other systems reviewed and are negative.      Objective:   Physical Exam  Cardiovascular: Normal rate and regular rhythm.   Pulmonary/Chest: Effort normal and breath sounds normal.  Vitals reviewed.        Assessment &  Plan:  5 sutures removed without difficulty.

## 2015-11-15 DIAGNOSIS — E119 Type 2 diabetes mellitus without complications: Secondary | ICD-10-CM | POA: Diagnosis not present

## 2015-11-15 DIAGNOSIS — H5203 Hypermetropia, bilateral: Secondary | ICD-10-CM | POA: Diagnosis not present

## 2015-11-15 DIAGNOSIS — H25813 Combined forms of age-related cataract, bilateral: Secondary | ICD-10-CM | POA: Diagnosis not present

## 2015-11-15 DIAGNOSIS — H524 Presbyopia: Secondary | ICD-10-CM | POA: Diagnosis not present

## 2015-11-15 LAB — HM DIABETES EYE EXAM

## 2015-11-17 ENCOUNTER — Encounter: Payer: Self-pay | Admitting: Family Medicine

## 2015-11-18 ENCOUNTER — Ambulatory Visit (INDEPENDENT_AMBULATORY_CARE_PROVIDER_SITE_OTHER): Payer: Medicare Other | Admitting: *Deleted

## 2015-11-18 ENCOUNTER — Ambulatory Visit: Payer: Medicare Other

## 2015-11-18 DIAGNOSIS — Z23 Encounter for immunization: Secondary | ICD-10-CM | POA: Diagnosis not present

## 2015-11-18 NOTE — Progress Notes (Signed)
Patient ID: Bradley Baldwin, male   DOB: 1950/11/21, 65 y.o.   MRN: [id number] Patient seen in office for Influenza Vaccination.   Tolerated IM administration well.   Immunization history updated.

## 2015-12-14 ENCOUNTER — Other Ambulatory Visit: Payer: Self-pay | Admitting: Family Medicine

## 2015-12-14 NOTE — Telephone Encounter (Signed)
okay

## 2015-12-14 NOTE — Telephone Encounter (Signed)
Ok to refill??  Last office visit/ refill 09/27/2015.

## 2015-12-15 ENCOUNTER — Other Ambulatory Visit: Payer: Self-pay | Admitting: Family Medicine

## 2015-12-15 MED ORDER — GLUCOSE BLOOD VI STRP: ORAL_STRIP | 12 refills | Status: DC

## 2015-12-15 MED ORDER — ONETOUCH ULTRASOFT LANCETS MISC: 12 refills | Status: DC

## 2015-12-25 ENCOUNTER — Other Ambulatory Visit: Payer: Self-pay | Admitting: Family Medicine

## 2015-12-25 DIAGNOSIS — I10 Essential (primary) hypertension: Secondary | ICD-10-CM

## 2016-01-14 ENCOUNTER — Telehealth: Payer: Self-pay | Admitting: Family Medicine

## 2016-01-14 MED ORDER — AMOXICILLIN 875 MG PO TABS: 875.0000 mg | ORAL_TABLET | Freq: Two times a day (BID) | ORAL | 0 refills | Status: DC

## 2016-01-14 NOTE — Telephone Encounter (Signed)
Amoxicillin 875 bid for 10 days 

## 2016-01-14 NOTE — Telephone Encounter (Signed)
Spoke to wife , made aware of RX

## 2016-01-14 NOTE — Telephone Encounter (Signed)
Pt out of town for 2 weeks.  Has sinusitis.  Congestion, drainage, feels bad.  MBD sent wife Abx last week.  Now he has it.  Can we call something in for him?

## 2016-02-14 DIAGNOSIS — M7541 Impingement syndrome of right shoulder: Secondary | ICD-10-CM | POA: Diagnosis not present

## 2016-02-23 DIAGNOSIS — M25512 Pain in left shoulder: Secondary | ICD-10-CM | POA: Diagnosis not present

## 2016-02-23 DIAGNOSIS — M25511 Pain in right shoulder: Secondary | ICD-10-CM | POA: Diagnosis not present

## 2016-02-28 DIAGNOSIS — M7541 Impingement syndrome of right shoulder: Secondary | ICD-10-CM | POA: Diagnosis not present

## 2016-03-09 DIAGNOSIS — M66321 Spontaneous rupture of flexor tendons, right upper arm: Secondary | ICD-10-CM | POA: Diagnosis not present

## 2016-03-09 DIAGNOSIS — M19011 Primary osteoarthritis, right shoulder: Secondary | ICD-10-CM | POA: Diagnosis not present

## 2016-03-09 DIAGNOSIS — M66821 Spontaneous rupture of other tendons, right upper arm: Secondary | ICD-10-CM | POA: Diagnosis not present

## 2016-03-09 DIAGNOSIS — M7541 Impingement syndrome of right shoulder: Secondary | ICD-10-CM | POA: Diagnosis not present

## 2016-03-09 DIAGNOSIS — G8918 Other acute postprocedural pain: Secondary | ICD-10-CM | POA: Diagnosis not present

## 2016-03-20 DIAGNOSIS — M7541 Impingement syndrome of right shoulder: Secondary | ICD-10-CM | POA: Diagnosis not present

## 2016-03-22 DIAGNOSIS — M25511 Pain in right shoulder: Secondary | ICD-10-CM | POA: Diagnosis not present

## 2016-03-22 DIAGNOSIS — Z4789 Encounter for other orthopedic aftercare: Secondary | ICD-10-CM | POA: Diagnosis not present

## 2016-04-14 DIAGNOSIS — Z9889 Other specified postprocedural states: Secondary | ICD-10-CM | POA: Diagnosis not present

## 2016-05-11 ENCOUNTER — Ambulatory Visit (INDEPENDENT_AMBULATORY_CARE_PROVIDER_SITE_OTHER): Payer: Medicare Other | Admitting: Physician Assistant

## 2016-05-11 ENCOUNTER — Encounter: Payer: Self-pay | Admitting: Physician Assistant

## 2016-05-11 VITALS — BP 140/88 | HR 78 | Temp 98.2°F | Resp 14 | Wt 163.2 lb

## 2016-05-11 DIAGNOSIS — J988 Other specified respiratory disorders: Secondary | ICD-10-CM | POA: Diagnosis not present

## 2016-05-11 DIAGNOSIS — B9689 Other specified bacterial agents as the cause of diseases classified elsewhere: Secondary | ICD-10-CM

## 2016-05-11 MED ORDER — HYDROCODONE-HOMATROPINE 5-1.5 MG/5ML PO SYRP: 5.0000 mL | ORAL_SOLUTION | Freq: Three times a day (TID) | ORAL | 0 refills | Status: DC | PRN

## 2016-05-11 MED ORDER — AZITHROMYCIN 250 MG PO TABS: ORAL_TABLET | ORAL | 0 refills | Status: DC

## 2016-05-11 NOTE — Progress Notes (Signed)
Patient ID: Bradley Baldwin MRN: [id number], DOB: 02-24-1950, 66 y.o. Date of Encounter: 05/11/2016, 12:24 PM    Chief Complaint:  Chief Complaint  Patient presents with  . Sinus Problem  . Cough     HPI: 66 y.o. year old male states that the symptoms have been going on for 6 days. Says that he feels a lot of drainage going down his throat. Says in the morning it takes him 4 or 5 hours of coughing up phlegm to get things feeling cleared out there. Having congestion in head and nose and mucus from nose. Has had no significant sore throat other than just irritation from the drainage. No fevers or chills.     Home Meds:   Outpatient Medications Prior to Visit  Medication Sig Dispense Refill  . amLODipine (NORVASC) 10 MG tablet TAKE 1 TABLET BY MOUTH DAILY. 90 tablet 3  . aspirin EC 81 MG tablet Take 81 mg by mouth daily.    Marland Kitchen atenolol (TENORMIN) 50 MG tablet TAKE 1 TABLET BY MOUTH DAILY. 90 tablet 4  . Blood Glucose Monitoring Suppl (BAYER CONTOUR NEXT MONITOR) w/Device KIT Checks BS qd-bid - Needs meter, strips #200/4 rf - lancets #200/4 rf - DX E11.65 1 kit 0  . fluticasone (FLONASE) 50 MCG/ACT nasal spray Place 2 sprays into the nose daily. (Patient taking differently: Place 2 sprays into the nose daily. PRN) 16 g 6  . glucose blood test strip Check bs qd E11.9 100 each 12  . hydrochlorothiazide (MICROZIDE) 12.5 MG capsule TAKE 1 CAPSULE BY MOUTH DAILY. 90 capsule 4  . Lancets (ONETOUCH ULTRASOFT) lancets Use as instructed 100 each 12  . lisinopril (PRINIVIL,ZESTRIL) 40 MG tablet TAKE 1 TABLET BY MOUTH DAILY. 90 tablet 4  . metFORMIN (GLUCOPHAGE) 1000 MG tablet TAKE 1 TABLET BY MOUTH 2 TIMES DAILY WITH MEALS 180 tablet 3  . sildenafil (REVATIO) 20 MG tablet TAKE 3 TO 5 TABLETS BY MOUTH DAILY AS NEEDED 30 tablet 3  . amoxicillin (AMOXIL) 875 MG tablet Take 1 tablet (875 mg total) by mouth 2 (two) times daily. 20 tablet 0   No facility-administered medications prior to visit.      Allergies: No Known Allergies    Review of Systems: See HPI for pertinent ROS. All other ROS negative.    Physical Exam: Blood pressure 140/88, pulse 78, temperature 98.2 F (36.8 C), temperature source Oral, resp. rate 14, weight 163 lb 3.2 oz (74 kg), SpO2 98 %., Body mass index is 28.46 kg/m. General:  WNWD WM. Appears in no acute distress. HEENT: Normocephalic, atraumatic, eyes without discharge, sclera non-icteric, nares are without discharge. Bilateral auditory canals clear, TM's are without perforation, pearly grey and translucent with reflective cone of light bilaterally. Oral cavity moist, posterior pharynx without exudate, erythema, peritonsillar abscess. No tenderness with percussion to frontal or maxillary sinuses bilaterally.  Neck: Supple. No thyromegaly. No lymphadenopathy. Lungs: Clear bilaterally to auscultation without wheezes, rales, or rhonchi. Breathing is unlabored. Heart: Regular rhythm. No murmurs, rubs, or gallops. Msk:  Strength and tone normal for age. Extremities/Skin: Warm and dry. Neuro: Alert and oriented X 3. Moves all extremities spontaneously. Gait is normal. CNII-XII grossly in tact. Psych:  Responds to questions appropriately with a normal affect.     ASSESSMENT AND PLAN:  66 y.o. year old male with  1. Bacterial respiratory infection Take antibiotic as directed. An use Hycodan as cough suppressant especially so can get sleep at night. Follow-up if symptoms do not  resolve within 1 week after completion of antibiotic. - azithromycin (ZITHROMAX) 250 MG tablet; Day 1: Take 2 daily. Days 2 -5: Take 1 daily.  Dispense: 6 tablet; Refill: 0 - HYDROcodone-homatropine (HYCODAN) 5-1.5 MG/5ML syrup; Take 5 mLs by mouth every 8 (eight) hours as needed for cough.  Dispense: 120 mL; Refill: 0   Signed, [address] [place], [place], [facility] 05/11/2016 12:24 PM

## 2016-05-15 ENCOUNTER — Other Ambulatory Visit: Payer: Self-pay | Admitting: Family Medicine

## 2016-05-15 NOTE — Telephone Encounter (Signed)
Medication refill for one time only.  Patient needs to be seen.  Letter sent for patient to call and schedule.  Has been a year since last diabetic check up and fasting blood work

## 2016-05-16 ENCOUNTER — Encounter: Payer: Self-pay | Admitting: Family Medicine

## 2016-05-16 MED ORDER — METFORMIN HCL 1000 MG PO TABS: ORAL_TABLET | ORAL | 0 refills | Status: DC

## 2016-05-17 ENCOUNTER — Other Ambulatory Visit: Payer: Medicare Other

## 2016-05-17 DIAGNOSIS — K760 Fatty (change of) liver, not elsewhere classified: Secondary | ICD-10-CM | POA: Diagnosis not present

## 2016-05-17 DIAGNOSIS — R7303 Prediabetes: Secondary | ICD-10-CM | POA: Diagnosis not present

## 2016-05-17 DIAGNOSIS — Z13220 Encounter for screening for lipoid disorders: Secondary | ICD-10-CM

## 2016-05-17 DIAGNOSIS — Z125 Encounter for screening for malignant neoplasm of prostate: Secondary | ICD-10-CM | POA: Diagnosis not present

## 2016-05-17 LAB — COMPLETE METABOLIC PANEL WITH GFR
ALBUMIN: 4.2 g/dL (ref 3.6–5.1)
ALK PHOS: 61 U/L (ref 40–115)
ALT: 70 U/L — AB (ref 9–46)
AST: 47 U/L — AB (ref 10–35)
BILIRUBIN TOTAL: 0.7 mg/dL (ref 0.2–1.2)
BUN: 23 mg/dL (ref 7–25)
CALCIUM: 9.7 mg/dL (ref 8.6–10.3)
CHLORIDE: 98 mmol/L (ref 98–110)
CO2: 24 mmol/L (ref 20–31)
CREATININE: 1.11 mg/dL (ref 0.70–1.25)
GFR, EST AFRICAN AMERICAN: 80 mL/min (ref >=60)
GFR, Est Non African American: 69 mL/min (ref >=60)
Glucose, Bld: 123 mg/dL — ABNORMAL HIGH (ref 70–99)
Potassium: 4.5 mmol/L (ref 3.5–5.3)
Sodium: 134 mmol/L — ABNORMAL LOW (ref 135–146)
TOTAL PROTEIN: 6.7 g/dL (ref 6.1–8.1)

## 2016-05-17 LAB — CBC WITH DIFFERENTIAL/PLATELET
BASOS PCT: 1 %
Basophils Absolute: 79 cells/uL (ref 0–200)
EOS ABS: 237 {cells}/uL (ref 15–500)
Eosinophils Relative: 3 %
HEMATOCRIT: 40.9 % (ref 38.5–50.0)
HEMOGLOBIN: 13.9 g/dL (ref 13.0–17.0)
LYMPHS PCT: 51 %
Lymphs Abs: 4029 cells/uL — ABNORMAL HIGH (ref 850–3900)
MCH: 32.7 pg (ref 27.0–33.0)
MCHC: 34 g/dL (ref 32.0–36.0)
MCV: 96.2 fL (ref 80.0–100.0)
MONO ABS: 711 {cells}/uL (ref 200–950)
MPV: 9.6 fL (ref 7.5–12.5)
Monocytes Relative: 9 %
Neutro Abs: 2844 cells/uL (ref 1500–7800)
Neutrophils Relative %: 36 %
Platelets: 246 10*3/uL (ref 140–400)
RBC: 4.25 MIL/uL (ref 4.20–5.80)
RDW: 12.6 % (ref 11.0–15.0)
WBC: 7.9 10*3/uL (ref 3.8–10.8)

## 2016-05-17 LAB — LIPID PANEL
CHOLESTEROL: 155 mg/dL (ref <200)
HDL: 45 mg/dL (ref >40)
LDL Cholesterol: 86 mg/dL (ref <100)
TRIGLYCERIDES: 120 mg/dL (ref <150)
Total CHOL/HDL Ratio: 3.4 Ratio (ref <5.0)
VLDL: 24 mg/dL (ref <30)

## 2016-05-18 LAB — HEMOGLOBIN A1C
Hgb A1c MFr Bld: 5.2 % (ref <5.7)
MEAN PLASMA GLUCOSE: 103 mg/dL

## 2016-05-18 LAB — PSA: PSA: 0.2 ng/mL (ref <=4.0)

## 2016-06-02 ENCOUNTER — Ambulatory Visit (INDEPENDENT_AMBULATORY_CARE_PROVIDER_SITE_OTHER): Payer: Medicare Other | Admitting: Family Medicine

## 2016-06-02 ENCOUNTER — Encounter: Payer: Self-pay | Admitting: Family Medicine

## 2016-06-02 VITALS — BP 132/76 | HR 62 | Temp 97.9°F | Resp 18 | Ht 63.5 in | Wt 163.0 lb

## 2016-06-02 DIAGNOSIS — I10 Essential (primary) hypertension: Secondary | ICD-10-CM | POA: Diagnosis not present

## 2016-06-02 DIAGNOSIS — Z0000 Encounter for general adult medical examination without abnormal findings: Secondary | ICD-10-CM

## 2016-06-02 DIAGNOSIS — Z23 Encounter for immunization: Secondary | ICD-10-CM

## 2016-06-02 DIAGNOSIS — K760 Fatty (change of) liver, not elsewhere classified: Secondary | ICD-10-CM

## 2016-06-02 DIAGNOSIS — E119 Type 2 diabetes mellitus without complications: Secondary | ICD-10-CM

## 2016-06-02 NOTE — Progress Notes (Signed)
Subjective:    Patient ID: Bradley Baldwin, male    DOB: 03-21-1950, 66 y.o.   MRN: [id number]  HPI Patient is here today for complete physical exam. His colonoscopy was performed in 2016 and was significant for 4 tubular adenomas. He is scheduled repeat colonoscopy in 2019. His Blood sugar is outstanding. His average blood sugar is averaging around 130. His hemoglobin A1c is excellent at 5.2. His blood pressure has been averaging in the 120's/80's.   His immunizations are up-to-date except for Prevnar 13 and the shingles vaccine. We have previously checked the patient for hepatitis C prior to joining epic. Past Medical History:  Diagnosis Date  . Arthritis    "starting to get achy joints here and there" (11/28/2011)  . Colon polyps   . External bleeding hemorrhoids    "occasionally" (11/28/2011)  . Fatty liver disease, nonalcoholic   . GERD (gastroesophageal reflux disease)   . Hypertension    pcp   dr pickerd   [place]  . Prediabetes    Past Surgical History:  Procedure Laterality Date  . 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 amLODipine (NORVASC) 10 MG tablet TAKE 1 TABLET BY MOUTH DAILY. 90 tablet 3  . aspirin EC 81 MG tablet Take 81 mg by mouth daily.    Marland Kitchen atenolol (TENORMIN) 50 MG tablet TAKE 1 TABLET BY MOUTH DAILY. 90 tablet 4  . fluticasone (FLONASE) 50 MCG/ACT nasal spray Place 2 sprays into the nose daily. (Patient taking differently: Place 2 sprays into the nose daily. PRN) 16 g 6  . glucose blood test strip Check bs qd E11.9 100 each 12  . hydrochlorothiazide (MICROZIDE) 12.5 MG capsule TAKE 1 CAPSULE BY MOUTH DAILY. 90 capsule 4  . Lancets (ONETOUCH ULTRASOFT) lancets Use as  instructed 100 each 12  . lisinopril (PRINIVIL,ZESTRIL) 40 MG tablet TAKE 1 TABLET BY MOUTH DAILY. 90 tablet 4  . metFORMIN (GLUCOPHAGE) 1000 MG tablet TAKE 1 TABLET BY MOUTH 2 TIMES DAILY WITH MEALS 60 tablet 0  . sildenafil (REVATIO) 20 MG tablet TAKE 3 TO 5 TABLETS BY MOUTH DAILY AS NEEDED 30 tablet 3   No current facility-administered medications on file prior to visit.    No Known Allergies Social History   Social History  . Marital status: Married    Spouse name: N/A  . Number of children: N/A  . Years of education: N/A   Occupational History  . Not on file.   Social History Main Topics  . Smoking status: Former Smoker    Packs/day: 2.00    Years: 10.00    Types: Cigarettes  . Smokeless tobacco: Former Systems developer    Types: Chew     Comment: 11/28/2011 "quit smoking ~ 30 yr ago; chewed some then quit that"  . Alcohol use 3.6 oz/week    6 Cans of beer per week     Comment: 11/28/2011 "drink a few beers 3 times/wk; ~ 6pk total/wk"  . Drug use: No  . Sexual activity: Yes   Other Topics Concern  . Not on file   Social History Narrative  . No narrative on file   No family history on file.' Appointment on 05/17/2016  Component Date  Value Ref Range Status  . WBC 05/17/2016 7.9  3.8 - 10.8 K/uL Final  . RBC 05/17/2016 4.25  4.20 - 5.80 MIL/uL Final  . Hemoglobin 05/17/2016 13.9  13.0 - 17.0 g/dL Final  . HCT 05/17/2016 40.9  38.5 - 50.0 % Final  . MCV 05/17/2016 96.2  80.0 - 100.0 fL Final  . MCH 05/17/2016 32.7  27.0 - 33.0 pg Final  . MCHC 05/17/2016 34.0  32.0 - 36.0 g/dL Final  . RDW 05/17/2016 12.6  11.0 - 15.0 % Final  . Platelets 05/17/2016 246  140 - 400 K/uL Final  . MPV 05/17/2016 9.6  7.5 - 12.5 fL Final  . Neutro Abs 05/17/2016 2844  1,500 - 7,800 cells/uL Final  . Lymphs Abs 05/17/2016 4029* 850 - 3,900 cells/uL Final  . Monocytes Absolute 05/17/2016 711  200 - 950 cells/uL Final  . Eosinophils Absolute 05/17/2016 237  15 - 500 cells/uL Final  . Basophils  Absolute 05/17/2016 79  0 - 200 cells/uL Final  . Neutrophils Relative % 05/17/2016 36  % Final  . Lymphocytes Relative 05/17/2016 51  % Final  . Monocytes Relative 05/17/2016 9  % Final  . Eosinophils Relative 05/17/2016 3  % Final  . Basophils Relative 05/17/2016 1  % Final  . Smear Review 05/17/2016 Criteria for review not met   Final  . Sodium 05/17/2016 134* 135 - 146 mmol/L Final  . Potassium 05/17/2016 4.5  3.5 - 5.3 mmol/L Final  . Chloride 05/17/2016 98  98 - 110 mmol/L Final  . CO2 05/17/2016 24  20 - 31 mmol/L Final  . Glucose, Bld 05/17/2016 123* 70 - 99 mg/dL Final  . BUN 05/17/2016 23  7 - 25 mg/dL Final  . Creat 05/17/2016 1.11  0.70 - 1.25 mg/dL Final   Comment:   For patients > or = 66 years of age: The upper reference limit for Creatinine is approximately 13% higher for people identified as African-American.     . Total Bilirubin 05/17/2016 0.7  0.2 - 1.2 mg/dL Final  . Alkaline Phosphatase 05/17/2016 61  40 - 115 U/L Final  . AST 05/17/2016 47* 10 - 35 U/L Final  . ALT 05/17/2016 70* 9 - 46 U/L Final  . Total Protein 05/17/2016 6.7  6.1 - 8.1 g/dL Final  . Albumin 05/17/2016 4.2  3.6 - 5.1 g/dL Final  . Calcium 05/17/2016 9.7  8.6 - 10.3 mg/dL Final  . GFR, Est African American 05/17/2016 80  >=60 mL/min Final  . GFR, Est Non African American 05/17/2016 69  >=60 mL/min Final  . Cholesterol 05/17/2016 155  <200 mg/dL Final  . Triglycerides 05/17/2016 120  <150 mg/dL Final  . HDL 05/17/2016 45  >40 mg/dL Final  . Total CHOL/HDL Ratio 05/17/2016 3.4  <5.0 Ratio Final  . VLDL 05/17/2016 24  <30 mg/dL Final  . LDL Cholesterol 05/17/2016 86  <100 mg/dL Final  . Hgb A1c MFr Bld 05/17/2016 5.2  <5.7 % Final   Comment:   For the purpose of screening for the presence of diabetes:   <5.7%       Consistent with the absence of diabetes 5.7-6.4 %   Consistent with increased risk for diabetes (prediabetes) >=6.5 %     Consistent with diabetes   This assay result is  consistent with a decreased risk of diabetes.   Currently, no consensus exists regarding use of hemoglobin A1c for diagnosis of diabetes in children.   According to American Diabetes  Association (ADA) guidelines, hemoglobin A1c <7.0% represents optimal control in non-pregnant diabetic patients. Different metrics may apply to specific patient populations. Standards of Medical Care in Diabetes (ADA).     . Mean Plasma Glucose 05/17/2016 103  mg/dL Final  . PSA 05/17/2016 0.2  <=4.0 ng/mL Final   Comment:   The total PSA value from this assay system is standardized against the WHO standard. The test result will be approximately 20% lower when compared to the equimolar-standardized total PSA ([employer] Coulter). Comparison of serial PSA results should be interpreted with this fact in mind.   This test was performed using the Siemens chemiluminescent method. Values obtained from different assay methods cannot be used interchangeably. PSA levels, regardless of value, should not be interpreted as absolute evidence of the presence or absence of disease.        Review of Systems  All other systems reviewed and are negative.      Objective:   Physical Exam  Constitutional: He is oriented to person, place, and time. He appears well-developed and well-nourished. No distress.  HENT:  Head: Normocephalic and atraumatic.  Right Ear: External ear normal.  Left Ear: External ear normal.  Nose: Nose normal.  Mouth/Throat: Oropharynx is clear and moist. No oropharyngeal exudate.  Eyes: Conjunctivae and EOM are normal. Pupils are equal, round, and reactive to light. Right eye exhibits no discharge. Left eye exhibits no discharge. No scleral icterus.  Neck: Normal range of motion. Neck supple. No JVD present. No tracheal deviation present. No thyromegaly present.  Cardiovascular: Normal rate, regular rhythm, normal heart sounds and intact distal pulses.  Exam reveals no gallop and no friction  rub.   No murmur heard. Pulmonary/Chest: Effort normal and breath sounds normal. No stridor. No respiratory distress. He has no wheezes. He has no rales. He exhibits no tenderness.  Abdominal: Soft. Bowel sounds are normal. He exhibits no distension and no mass. There is no tenderness. There is no rebound and no guarding.  Genitourinary: Rectum normal and prostate normal.  Musculoskeletal: Normal range of motion. He exhibits no edema or tenderness.  Lymphadenopathy:    He has no cervical adenopathy.  Neurological: He is alert and oriented to person, place, and time. He has normal reflexes. No cranial nerve deficit. He exhibits normal muscle tone. Coordination normal.  Skin: Skin is warm. No rash noted. He is not diaphoretic. No erythema. No pallor.  Psychiatric: He has a normal mood and affect. His behavior is normal. Judgment and thought content normal.  Vitals reviewed.         Assessment & Plan:  Need for prophylactic vaccination against Streptococcus pneumoniae (pneumococcus) - Plan: Pneumococcal conjugate vaccine 13-valent IM  Benign essential HTN  Controlled type 2 diabetes mellitus without complication, without long-term current use of insulin ([place])  Routine general medical examination at a health care facility  Fatty liver disease, nonalcoholic  Physical exam today is completely normal. Patient received Prevnar 15 today in office. We discussed the shingles vaccine. His blood pressure is excellent. His diabetes is extremely well controlled. I recommended reducing metformin to one half a tablet twice a day. Cholesterol is excellent. Liver tests are slightly elevated. I recommended 10 pounds weight loss and beginning to exercise 30 minutes a day 5 days a week with aerobic exercise which is currently not doing. Also recommended discontinuation of alcohol. Patient will schedule his colonoscopy for next year. Prostate exam is performed today and was normal in PSA is excellent. Regular  test for guidance  is provided. Diabetic eye exam is up-to-date and diabetic foot exam was performed today and was normal

## 2016-06-13 ENCOUNTER — Ambulatory Visit (INDEPENDENT_AMBULATORY_CARE_PROVIDER_SITE_OTHER): Payer: Medicare Other | Admitting: Family Medicine

## 2016-06-13 ENCOUNTER — Encounter: Payer: Self-pay | Admitting: Family Medicine

## 2016-06-13 VITALS — BP 136/80 | HR 62 | Temp 98.4°F | Resp 16 | Ht 64.0 in | Wt 165.0 lb

## 2016-06-13 DIAGNOSIS — D485 Neoplasm of uncertain behavior of skin: Secondary | ICD-10-CM | POA: Diagnosis not present

## 2016-06-13 DIAGNOSIS — D048 Carcinoma in situ of skin of other sites: Secondary | ICD-10-CM | POA: Diagnosis not present

## 2016-06-13 NOTE — Progress Notes (Signed)
Subjective:    Patient ID: Bradley Baldwin, male    DOB: 1950-01-31, 66 y.o.   MRN: [id number]  HPI Patient is returning to biopsy a suspicious lesion on his posterior right calf. There is a coin shaped lesion approximately 2 cm in diameter with a pink erythematous base and pink/white hyperkeratotic papular plaque on top of that. Differential diagnosis is squamous cell carcinoma versus psoriatic plaque versus atypical seborrheic keratosis. Past Medical History:  Diagnosis Date  . Arthritis    "starting to get achy joints here and there" (11/28/2011)  . Colon polyps   . External bleeding hemorrhoids    "occasionally" (11/28/2011)  . Fatty liver disease, nonalcoholic   . GERD (gastroesophageal reflux disease)   . Hypertension    pcp   dr pickerd   [place]  . Prediabetes    Past Surgical History:  Procedure Laterality Date  . 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 TOTAL HIP ARTHROPLASTY  11/28/2011   Procedure: TOTAL HIP ARTHROPLASTY ANTERIOR APPROACH;  Surgeon: Hessie Dibble, MD;  Location: [facility];  Service: Orthopedics;  Laterality: Right;   Current Outpatient Prescriptions on File Prior to Visit  Medication Sig Dispense Refill  . amLODipine (NORVASC) 10 MG tablet TAKE 1 TABLET BY MOUTH DAILY. 90 tablet 3  . aspirin EC 81 MG tablet Take 81 mg by mouth daily.    Marland Kitchen atenolol (TENORMIN) 50 MG tablet TAKE 1 TABLET BY MOUTH DAILY. 90 tablet 4  . fluticasone (FLONASE) 50 MCG/ACT nasal spray Place 2 sprays into the nose daily. (Patient taking differently: Place 2 sprays into the nose daily. PRN) 16 g 6  . glucose blood test strip Check bs qd E11.9 100 each 12  . hydrochlorothiazide (MICROZIDE) 12.5 MG capsule TAKE 1 CAPSULE BY MOUTH DAILY. 90 capsule 4  . Lancets (ONETOUCH ULTRASOFT) lancets Use as instructed 100 each 12  . lisinopril (PRINIVIL,ZESTRIL) 40 MG tablet TAKE 1 TABLET BY MOUTH DAILY. 90 tablet 4  . metFORMIN (GLUCOPHAGE) 1000 MG tablet TAKE 1 TABLET BY MOUTH 2  TIMES DAILY WITH MEALS 60 tablet 0  . sildenafil (REVATIO) 20 MG tablet TAKE 3 TO 5 TABLETS BY MOUTH DAILY AS NEEDED 30 tablet 3   No current facility-administered medications on file prior to visit.    No Known Allergies Social History   Social History  . Marital status: Married    Spouse name: N/A  . Number of children: N/A  . Years of education: N/A   Occupational History  . Not on file.   Social History Main Topics  . Smoking status: Former Smoker    Packs/day: 2.00    Years: 10.00    Types: Cigarettes  . Smokeless tobacco: Former Systems developer    Types: Chew     Comment: 11/28/2011 "quit smoking ~ 30 yr ago; chewed some then quit that"  . Alcohol use 3.6 oz/week    6 Cans of beer per week     Comment: 11/28/2011 "drink a few beers 3 times/wk; ~ 6pk total/wk"  . Drug use: No  . Sexual activity: Yes   Other Topics Concern  . Not on file   Social History Narrative  . No narrative on file      Review of Systems  All other systems reviewed and are negative.      Objective:   Physical Exam  Constitutional: He appears well-developed and well-nourished.  Cardiovascular: Normal rate, regular rhythm and normal heart sounds.   Pulmonary/Chest: Effort  normal and breath sounds normal.  Skin: Rash noted.    See hpi      Assessment & Plan:  Neoplasm of uncertain behavior of skin of ankle - Plan: Pathology  Area on the posterior calf is anesthetized with 0.1% lidocaine without epinephrine. The area was then prepped and draped in sterile fashion using Betadine. A 3 cm x 2.5 cm ellipse was then cut completely around the lesion on his calf.  The lesion was then excised using a scalpel down to the underlying subcutaneous fat. Skin edges were approximated using 5 simple interrupted Ethilon sutures. Wound care was discussed. Estimated blood loss was minimal less than 5 mL.  Stitches out in 10 days

## 2016-06-14 LAB — PATHOLOGY

## 2016-06-15 ENCOUNTER — Encounter: Payer: Self-pay | Admitting: Family Medicine

## 2016-06-23 ENCOUNTER — Ambulatory Visit: Payer: Medicare Other | Admitting: Family Medicine

## 2016-07-23 ENCOUNTER — Other Ambulatory Visit: Payer: Self-pay | Admitting: Family Medicine

## 2016-08-23 ENCOUNTER — Encounter: Payer: Self-pay | Admitting: Family Medicine

## 2016-08-23 DIAGNOSIS — H5203 Hypermetropia, bilateral: Secondary | ICD-10-CM | POA: Diagnosis not present

## 2016-08-23 DIAGNOSIS — H524 Presbyopia: Secondary | ICD-10-CM | POA: Diagnosis not present

## 2016-08-23 DIAGNOSIS — E119 Type 2 diabetes mellitus without complications: Secondary | ICD-10-CM | POA: Diagnosis not present

## 2016-08-23 DIAGNOSIS — H25813 Combined forms of age-related cataract, bilateral: Secondary | ICD-10-CM | POA: Diagnosis not present

## 2016-08-23 LAB — HM DIABETES EYE EXAM

## 2016-08-28 ENCOUNTER — Other Ambulatory Visit: Payer: Self-pay | Admitting: Family Medicine

## 2016-10-05 ENCOUNTER — Other Ambulatory Visit: Payer: Self-pay | Admitting: Family Medicine

## 2016-10-10 DIAGNOSIS — D692 Other nonthrombocytopenic purpura: Secondary | ICD-10-CM | POA: Diagnosis not present

## 2016-10-10 DIAGNOSIS — L570 Actinic keratosis: Secondary | ICD-10-CM | POA: Diagnosis not present

## 2016-10-10 DIAGNOSIS — Z85828 Personal history of other malignant neoplasm of skin: Secondary | ICD-10-CM | POA: Diagnosis not present

## 2016-10-10 DIAGNOSIS — D1801 Hemangioma of skin and subcutaneous tissue: Secondary | ICD-10-CM | POA: Diagnosis not present

## 2016-10-10 DIAGNOSIS — L821 Other seborrheic keratosis: Secondary | ICD-10-CM | POA: Diagnosis not present

## 2016-11-15 ENCOUNTER — Other Ambulatory Visit: Payer: Self-pay | Admitting: Family Medicine

## 2016-11-20 DIAGNOSIS — M9902 Segmental and somatic dysfunction of thoracic region: Secondary | ICD-10-CM | POA: Diagnosis not present

## 2016-11-20 DIAGNOSIS — M546 Pain in thoracic spine: Secondary | ICD-10-CM | POA: Diagnosis not present

## 2016-11-21 DIAGNOSIS — M9902 Segmental and somatic dysfunction of thoracic region: Secondary | ICD-10-CM | POA: Diagnosis not present

## 2016-11-21 DIAGNOSIS — M546 Pain in thoracic spine: Secondary | ICD-10-CM | POA: Diagnosis not present

## 2016-11-23 DIAGNOSIS — M9902 Segmental and somatic dysfunction of thoracic region: Secondary | ICD-10-CM | POA: Diagnosis not present

## 2016-11-23 DIAGNOSIS — M546 Pain in thoracic spine: Secondary | ICD-10-CM | POA: Diagnosis not present

## 2016-11-27 ENCOUNTER — Ambulatory Visit (INDEPENDENT_AMBULATORY_CARE_PROVIDER_SITE_OTHER): Payer: Medicare Other | Admitting: Family Medicine

## 2016-11-27 DIAGNOSIS — Z23 Encounter for immunization: Secondary | ICD-10-CM

## 2016-11-30 DIAGNOSIS — M9902 Segmental and somatic dysfunction of thoracic region: Secondary | ICD-10-CM | POA: Diagnosis not present

## 2016-11-30 DIAGNOSIS — M546 Pain in thoracic spine: Secondary | ICD-10-CM | POA: Diagnosis not present

## 2016-12-13 DIAGNOSIS — Y9301 Activity, walking, marching and hiking: Secondary | ICD-10-CM | POA: Diagnosis not present

## 2016-12-13 DIAGNOSIS — M5441 Lumbago with sciatica, right side: Secondary | ICD-10-CM | POA: Diagnosis not present

## 2016-12-13 DIAGNOSIS — S29012A Strain of muscle and tendon of back wall of thorax, initial encounter: Secondary | ICD-10-CM | POA: Diagnosis not present

## 2016-12-13 DIAGNOSIS — M25659 Stiffness of unspecified hip, not elsewhere classified: Secondary | ICD-10-CM | POA: Diagnosis not present

## 2016-12-13 DIAGNOSIS — M542 Cervicalgia: Secondary | ICD-10-CM | POA: Diagnosis not present

## 2016-12-13 DIAGNOSIS — M9903 Segmental and somatic dysfunction of lumbar region: Secondary | ICD-10-CM | POA: Diagnosis not present

## 2016-12-18 DIAGNOSIS — M9902 Segmental and somatic dysfunction of thoracic region: Secondary | ICD-10-CM | POA: Diagnosis not present

## 2016-12-18 DIAGNOSIS — M546 Pain in thoracic spine: Secondary | ICD-10-CM | POA: Diagnosis not present

## 2016-12-20 DIAGNOSIS — M546 Pain in thoracic spine: Secondary | ICD-10-CM | POA: Diagnosis not present

## 2016-12-20 DIAGNOSIS — M9902 Segmental and somatic dysfunction of thoracic region: Secondary | ICD-10-CM | POA: Diagnosis not present

## 2017-01-01 ENCOUNTER — Other Ambulatory Visit: Payer: Self-pay

## 2017-01-01 ENCOUNTER — Encounter: Payer: Self-pay | Admitting: Physician Assistant

## 2017-01-01 ENCOUNTER — Ambulatory Visit (INDEPENDENT_AMBULATORY_CARE_PROVIDER_SITE_OTHER): Payer: Medicare Other | Admitting: Physician Assistant

## 2017-01-01 VITALS — BP 130/72 | HR 62 | Temp 98.1°F | Resp 16 | Wt 171.4 lb

## 2017-01-01 DIAGNOSIS — J988 Other specified respiratory disorders: Secondary | ICD-10-CM

## 2017-01-01 DIAGNOSIS — B9689 Other specified bacterial agents as the cause of diseases classified elsewhere: Principal | ICD-10-CM

## 2017-01-01 MED ORDER — HYDROCODONE-HOMATROPINE 5-1.5 MG/5ML PO SYRP: 5.0000 mL | ORAL_SOLUTION | Freq: Four times a day (QID) | ORAL | 0 refills | Status: DC | PRN

## 2017-01-01 MED ORDER — AZITHROMYCIN 250 MG PO TABS: ORAL_TABLET | ORAL | 0 refills | Status: DC

## 2017-01-01 NOTE — Progress Notes (Signed)
Patient ID: Bradley Baldwin MRN: [id number], DOB: 1950/03/20, 66 y.o. Date of Encounter: 01/01/2017, 10:19 AM    Chief Complaint:  Chief Complaint  Patient presents with  . Sinusitis    x 1 week      HPI: 66 y.o. year old male resents with above.  He reports that he has been feeling a lot of drainage down his throat going into his chest causing him to cough.  His throat will feel irritated and cause him to cough.  Says that he is not blowing anything out of his nose.  Instead, it it is all draining down the back.  Has had no fevers or chills.  No other associated symptoms.     Home Meds:   Outpatient Medications Prior to Visit  Medication Sig Dispense Refill  . amLODipine (NORVASC) 10 MG tablet TAKE 1 TABLET BY MOUTH DAILY. 90 tablet 3  . aspirin EC 81 MG tablet Take 81 mg by mouth daily.    Marland Kitchen atenolol (TENORMIN) 50 MG tablet TAKE 1 TABLET BY MOUTH DAILY. 90 tablet 3  . fluticasone (FLONASE) 50 MCG/ACT nasal spray Place 2 sprays into the nose daily. (Patient taking differently: Place 2 sprays into the nose daily. PRN) 16 g 6  . glucose blood test strip Check bs qd E11.9 100 each 12  . hydrochlorothiazide (MICROZIDE) 12.5 MG capsule TAKE 1 CAPSULE BY MOUTH DAILY. 90 capsule 3  . Lancets (ONETOUCH ULTRASOFT) lancets Use as instructed 100 each 12  . lisinopril (PRINIVIL,ZESTRIL) 40 MG tablet TAKE 1 TABLET BY MOUTH DAILY. 90 tablet 3  . metFORMIN (GLUCOPHAGE) 1000 MG tablet TAKE 1 TABLET BY MOUTH 2 TIMES DAILY WITH MEALS (Patient taking differently: TAKE 1 TABLET BY MOUTH 2 TIMES DAILY WITH MEALS/ patient taking 500 mg daily) 60 tablet 3  . sildenafil (REVATIO) 20 MG tablet TAKE 3 TO 5 TABLETS BY MOUTH DAILY AS NEEDED 30 tablet 3   No facility-administered medications prior to visit.     Allergies: No Known Allergies    Review of Systems: See HPI for pertinent ROS. All other ROS negative.    Physical Exam: Blood pressure 130/72, pulse 62, temperature 98.1 F (36.7 C),  temperature source Oral, resp. rate 16, weight 77.7 kg (171 lb 6.4 oz), SpO2 99 %., Body mass index is 29.42 kg/m. General:  WNWD WM. Appears in no acute distress. HEENT: Normocephalic, atraumatic, eyes without discharge, sclera non-icteric, nares are without discharge. Bilateral auditory canals clear, TM's are without perforation, pearly grey and translucent with reflective cone of light bilaterally. Oral cavity moist, posterior pharynx without exudate, erythema, peritonsillar abscess.  Neck: Supple. No thyromegaly. No lymphadenopathy. Lungs: Clear bilaterally to auscultation without wheezes, rales, or rhonchi. Breathing is unlabored. Heart: Regular rhythm. No murmurs, rubs, or gallops. Msk:  Strength and tone normal for age. Extremities/Skin: Warm and dry. Neuro: Alert and oriented X 3. Moves all extremities spontaneously. Gait is normal. CNII-XII grossly in tact. Psych:  Responds to questions appropriately with a normal affect.     ASSESSMENT AND PLAN:  66 y.o. year old male with  1. Bacterial respiratory infection Take antibiotic as directed.  Can use Hycodan as cough suppressant for symptomatic relief especially prior to bedtime.  Follow-up if symptoms do not resolve within 1 week after completion of antibiotic. - azithromycin (ZITHROMAX) 250 MG tablet; Day 1: Take 2 daily. Days 2 - 5: Take 1 daily.  Dispense: 6 tablet; Refill: 0 - HYDROcodone-homatropine (HYCODAN) 5-1.5 MG/5ML syrup; Take 5 mLs  by mouth every 6 (six) hours as needed for cough.  Dispense: 120 mL; Refill: 0   Signed, [address] [place], [place], [facility] 01/01/2017 10:19 AM

## 2017-01-25 ENCOUNTER — Other Ambulatory Visit: Payer: Self-pay | Admitting: Family Medicine

## 2017-01-25 DIAGNOSIS — I10 Essential (primary) hypertension: Secondary | ICD-10-CM

## 2017-01-29 ENCOUNTER — Other Ambulatory Visit: Payer: Self-pay | Admitting: Family Medicine

## 2017-01-29 DIAGNOSIS — I10 Essential (primary) hypertension: Secondary | ICD-10-CM

## 2017-02-06 DIAGNOSIS — K648 Other hemorrhoids: Secondary | ICD-10-CM | POA: Diagnosis not present

## 2017-02-06 DIAGNOSIS — Z86010 Personal history of colonic polyps: Secondary | ICD-10-CM | POA: Diagnosis not present

## 2017-02-06 DIAGNOSIS — K621 Rectal polyp: Secondary | ICD-10-CM | POA: Diagnosis not present

## 2017-02-06 DIAGNOSIS — Z1211 Encounter for screening for malignant neoplasm of colon: Secondary | ICD-10-CM | POA: Diagnosis not present

## 2017-02-06 DIAGNOSIS — K635 Polyp of colon: Secondary | ICD-10-CM | POA: Diagnosis not present

## 2017-02-06 DIAGNOSIS — K5730 Diverticulosis of large intestine without perforation or abscess without bleeding: Secondary | ICD-10-CM | POA: Diagnosis not present

## 2017-02-06 DIAGNOSIS — D128 Benign neoplasm of rectum: Secondary | ICD-10-CM | POA: Diagnosis not present

## 2017-02-06 DIAGNOSIS — D124 Benign neoplasm of descending colon: Secondary | ICD-10-CM | POA: Diagnosis not present

## 2017-02-07 DIAGNOSIS — D128 Benign neoplasm of rectum: Secondary | ICD-10-CM | POA: Diagnosis not present

## 2017-02-07 DIAGNOSIS — K621 Rectal polyp: Secondary | ICD-10-CM | POA: Diagnosis not present

## 2017-02-07 DIAGNOSIS — D124 Benign neoplasm of descending colon: Secondary | ICD-10-CM | POA: Diagnosis not present

## 2017-02-24 ENCOUNTER — Other Ambulatory Visit: Payer: Self-pay | Admitting: Family Medicine

## 2017-02-28 DIAGNOSIS — H5310 Unspecified subjective visual disturbances: Secondary | ICD-10-CM | POA: Diagnosis not present

## 2017-02-28 DIAGNOSIS — H43811 Vitreous degeneration, right eye: Secondary | ICD-10-CM | POA: Diagnosis not present

## 2017-02-28 DIAGNOSIS — H25811 Combined forms of age-related cataract, right eye: Secondary | ICD-10-CM | POA: Diagnosis not present

## 2017-04-26 ENCOUNTER — Other Ambulatory Visit: Payer: Self-pay | Admitting: Family Medicine

## 2017-04-26 NOTE — Telephone Encounter (Signed)
Ok to refill 

## 2017-05-04 ENCOUNTER — Ambulatory Visit (INDEPENDENT_AMBULATORY_CARE_PROVIDER_SITE_OTHER): Payer: Medicare Other | Admitting: Family Medicine

## 2017-05-04 VITALS — BP 132/68 | HR 58 | Temp 98.7°F | Wt 163.0 lb

## 2017-05-04 DIAGNOSIS — L858 Other specified epidermal thickening: Secondary | ICD-10-CM | POA: Diagnosis not present

## 2017-05-04 DIAGNOSIS — B079 Viral wart, unspecified: Secondary | ICD-10-CM | POA: Diagnosis not present

## 2017-05-04 NOTE — Progress Notes (Signed)
Subjective:    Patient ID: Bradley Baldwin, male    DOB: Jun 19, 1950, 67 y.o.   MRN: [id number]  HPI  Patient presents with a suspicious lesion that is started growing just superior to his right elbow.  It is approximately 1 cm in diameter.  It is a white hard scaly firm papule which seems to be growing rapidly.  He would like this removed Past Medical History:  Diagnosis Date  . Arthritis    "starting to get achy joints here and there" (11/28/2011)  . Colon polyps   . External bleeding hemorrhoids    "occasionally" (11/28/2011)  . Fatty liver disease, nonalcoholic   . GERD (gastroesophageal reflux disease)   . Hypertension    pcp   dr pickerd   [place]  . Prediabetes    Past Surgical History:  Procedure Laterality Date  . 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 amLODipine (NORVASC) 10 MG tablet TAKE 1 TABLET BY MOUTH DAILY. 90 tablet 2  . aspirin EC 81 MG tablet Take 81 mg by mouth daily.    Marland Kitchen atenolol (TENORMIN) 50 MG tablet TAKE 1 TABLET BY MOUTH DAILY. 90 tablet 3  . azithromycin (ZITHROMAX) 250 MG tablet Day 1: Take 2 daily. Days 2 - 5: Take 1 daily. 6 tablet 0  . fluticasone (FLONASE) 50 MCG/ACT nasal spray Place 2 sprays into the nose daily. (Patient taking differently: Place 2 sprays into the nose daily. PRN) 16 g 6  . glucose blood test strip Check bs qd E11.9 100 each 12  . hydrochlorothiazide (MICROZIDE) 12.5 MG capsule TAKE 1 CAPSULE BY MOUTH DAILY. 90 capsule 3  . HYDROcodone-homatropine (HYCODAN) 5-1.5 MG/5ML syrup Take 5 mLs by mouth every 6 (six) hours as needed for cough. 120 mL 0  . Lancets (ONETOUCH ULTRASOFT) lancets Use as instructed 100 each 12  . lisinopril (PRINIVIL,ZESTRIL)  40 MG tablet TAKE 1 TABLET BY MOUTH DAILY. 90 tablet 3  . metFORMIN (GLUCOPHAGE) 1000 MG tablet TAKE ONE TABLET BY MOUTH TWICE DAILY WITH MEALS  60 tablet 2  . sildenafil (REVATIO) 20 MG tablet TAKE 3 TO 5 TABLETS BY MOUTH DAILY AS NEEDED 30 tablet 2   No current facility-administered medications on file prior to visit.    No Known Allergies Social History   Socioeconomic History  . Marital status: Married    Spouse name: Not on file  . Number of children: Not on file  . Years of education: Not on file  . Highest education level: Not on file  Occupational History  . Not on file  Social Needs  . Financial resource strain: Not on file  . Food insecurity:    Worry: Not on file    Inability: Not on file  . Transportation needs:    Medical: Not on file    Non-medical: Not on file  Tobacco Use  . Smoking status: Former Smoker    Packs/day: 2.00    Years: 10.00    Pack years: 20.00    Types: Cigarettes  . Smokeless tobacco: Former Systems developer    Types: Chew  . Tobacco comment: 11/28/2011 "quit smoking ~ 30 yr ago; chewed some then quit that"  Substance and Sexual Activity  .  Alcohol use: Yes    Alcohol/week: 3.6 oz    Types: 6 Cans of beer per week    Comment: 11/28/2011 "drink a few beers 3 times/wk; ~ 6pk total/wk"  . Drug use: No  . Sexual activity: Yes  Lifestyle  . Physical activity:    Days per week: Not on file    Minutes per session: Not on file  . Stress: Not on file  Relationships  . Social connections:    Talks on phone: Not on file    Gets together: Not on file    Attends religious service: Not on file    Active member of club or organization: Not on file    Attends meetings of clubs or organizations: Not on file    Relationship status: Not on file  . Intimate partner violence:    Fear of current or ex partner: Not on file    Emotionally abused: Not on file    Physically abused: Not on file    Forced sexual activity: Not on file  Other Topics Concern  . Not  on file  Social History Narrative  . Not on file     Review of Systems  All other systems reviewed and are negative.      Objective:   Physical Exam  Cardiovascular: Normal rate, regular rhythm and normal heart sounds.  Pulmonary/Chest: Effort normal and breath sounds normal.  Musculoskeletal:       Arms: Vitals reviewed.         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  After obtaining informed consent, the lesion was anesthetized with 0.1% lidocaine and prepped and draped in sterile fashion.  A 2 x 2 cm elliptical excision was performed around the lesion down to the subcutaneous fat and the lesion was sent to pathology in a labeled container.  The skin edges were then approximated using 4 simple interrupted 3-0 Ethilon sutures.  Wound care was discussed.  Return in 1 week for suture removal

## 2017-05-07 ENCOUNTER — Telehealth: Payer: Self-pay | Admitting: Family Medicine

## 2017-05-07 NOTE — Telephone Encounter (Signed)
Error

## 2017-05-08 ENCOUNTER — Encounter (INDEPENDENT_AMBULATORY_CARE_PROVIDER_SITE_OTHER): Payer: Self-pay

## 2017-05-08 LAB — PATHOLOGY

## 2017-05-08 LAB — TISSUE SPECIMEN

## 2017-05-14 ENCOUNTER — Encounter: Payer: Self-pay | Admitting: Family Medicine

## 2017-05-14 ENCOUNTER — Ambulatory Visit (INDEPENDENT_AMBULATORY_CARE_PROVIDER_SITE_OTHER): Payer: Medicare Other | Admitting: Family Medicine

## 2017-05-14 VITALS — BP 120/70 | HR 70 | Temp 98.1°F | Resp 16

## 2017-05-14 DIAGNOSIS — Z4802 Encounter for removal of sutures: Secondary | ICD-10-CM

## 2017-05-14 NOTE — Progress Notes (Signed)
Subjective:    Patient ID: Bradley Baldwin, male    DOB: 1950-09-03, 67 y.o.   MRN: [id number]  HPI 05/04/17 Patient presents with a suspicious lesion that is started growing just superior to his right elbow.  It is approximately 1 cm in diameter.  It is a white hard scaly firm papule which seems to be growing rapidly.  He would like this removed.  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  After obtaining informed consent, the lesion was anesthetized with 0.1% lidocaine and prepped and draped in sterile fashion.  A 2 x 2 cm elliptical excision was performed around the lesion down to the subcutaneous fat and the lesion was sent to pathology in a labeled container.  The skin edges were then approximated using 4 simple interrupted 3-0 Ethilon sutures.  Wound care was discussed.  Return in 1 week for suture removal  05/14/17 Here for suture removal.  Biopsy revealed benign verrucae vulgaris.   Past Medical History:  Diagnosis Date  . Arthritis    "starting to get achy joints here and there" (11/28/2011)  . Colon polyps   . External bleeding hemorrhoids    "occasionally" (11/28/2011)  . Fatty liver disease, nonalcoholic   . GERD (gastroesophageal reflux disease)   . Hypertension    pcp   dr pickerd   [place]  . Prediabetes    Past Surgical History:  Procedure Laterality Date  . 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 amLODipine (NORVASC) 10 MG tablet TAKE 1 TABLET BY MOUTH DAILY. 90 tablet 2  . aspirin EC 81 MG tablet Take 81 mg by mouth daily.    Marland Kitchen atenolol (TENORMIN) 50 MG tablet TAKE 1 TABLET BY MOUTH DAILY. 90 tablet 3  .  azithromycin (ZITHROMAX) 250 MG tablet Day 1: Take 2 daily. Days 2 - 5: Take 1 daily. 6 tablet 0  . fluticasone (FLONASE) 50 MCG/ACT nasal spray Place 2 sprays into the nose daily. (Patient taking differently: Place 2 sprays into the nose daily. PRN) 16 g 6  . glucose blood test strip Check bs qd E11.9 100 each 12  . hydrochlorothiazide (MICROZIDE) 12.5 MG capsule TAKE 1 CAPSULE BY MOUTH DAILY. 90 capsule 3  . HYDROcodone-homatropine (HYCODAN) 5-1.5 MG/5ML syrup Take 5 mLs by mouth every 6 (six) hours as needed for cough. 120 mL 0  . Lancets (ONETOUCH ULTRASOFT) lancets Use as instructed 100 each 12  . lisinopril (PRINIVIL,ZESTRIL) 40 MG tablet TAKE 1 TABLET BY MOUTH DAILY. 90 tablet 3  . metFORMIN (GLUCOPHAGE) 1000 MG tablet TAKE ONE TABLET BY MOUTH TWICE DAILY WITH MEALS  60 tablet 2  . sildenafil (REVATIO) 20 MG tablet TAKE 3 TO 5 TABLETS BY MOUTH DAILY AS NEEDED 30 tablet 2   No current facility-administered medications on file prior to visit.    No Known Allergies Social History   Socioeconomic History  . Marital status: Married    Spouse name: Not on file  . Number of children: Not on file  . Years of education: Not on file  . Highest education level: Not on file  Occupational History  . Not on file  Social Needs  . Financial resource strain: Not on file  . Food insecurity:    Worry: Not on file    Inability: Not on file  . Transportation needs:    Medical: Not on file    Non-medical: Not on file  Tobacco Use  . Smoking status: Former Smoker    Packs/day: 2.00    Years: 10.00    Pack years: 20.00    Types: Cigarettes  . Smokeless tobacco: Former Systems developer    Types: Chew  . Tobacco comment: 11/28/2011 "quit smoking ~ 30 yr ago; chewed some then quit that"  Substance and Sexual Activity  . Alcohol use: Yes    Alcohol/week: 3.6 oz    Types: 6 Cans of beer per week    Comment: 11/28/2011 "drink a few beers 3 times/wk; ~ 6pk total/wk"  . Drug use: No  . Sexual activity: Yes    Lifestyle  . Physical activity:    Days per week: Not on file    Minutes per session: Not on file  . Stress: Not on file  Relationships  . Social connections:    Talks on phone: Not on file    Gets together: Not on file    Attends religious service: Not on file    Active member of club or organization: Not on file    Attends meetings of clubs or organizations: Not on file    Relationship status: Not on file  . Intimate partner violence:    Fear of current or ex partner: Not on file    Emotionally abused: Not on file    Physically abused: Not on file    Forced sexual activity: Not on file  Other Topics Concern  . Not on file  Social History Narrative  . Not on file     Review of Systems  All other systems reviewed and are negative.      Objective:   Physical Exam  Cardiovascular: Normal rate, regular rhythm and normal heart sounds.  Pulmonary/Chest: Effort normal and breath sounds normal.  Musculoskeletal:       Arms: Vitals reviewed.   Wound well healed without cellulitis      Assessment & Plan:  Suture removal 4 sutures removed without difficulty.  Wound re-enforced with steri-strips.  Wound care discussed follow up prn.

## 2017-05-31 ENCOUNTER — Other Ambulatory Visit: Payer: Self-pay | Admitting: Family Medicine

## 2017-07-26 ENCOUNTER — Other Ambulatory Visit: Payer: Self-pay | Admitting: Family Medicine

## 2017-07-26 DIAGNOSIS — Z0000 Encounter for general adult medical examination without abnormal findings: Secondary | ICD-10-CM

## 2017-07-26 DIAGNOSIS — Z79899 Other long term (current) drug therapy: Secondary | ICD-10-CM

## 2017-07-26 DIAGNOSIS — K760 Fatty (change of) liver, not elsewhere classified: Secondary | ICD-10-CM

## 2017-07-26 DIAGNOSIS — E119 Type 2 diabetes mellitus without complications: Secondary | ICD-10-CM

## 2017-07-26 DIAGNOSIS — I10 Essential (primary) hypertension: Secondary | ICD-10-CM

## 2017-07-26 DIAGNOSIS — Z125 Encounter for screening for malignant neoplasm of prostate: Secondary | ICD-10-CM

## 2017-08-10 ENCOUNTER — Other Ambulatory Visit: Payer: Medicare Other

## 2017-08-10 DIAGNOSIS — E119 Type 2 diabetes mellitus without complications: Secondary | ICD-10-CM | POA: Diagnosis not present

## 2017-08-10 DIAGNOSIS — Z0000 Encounter for general adult medical examination without abnormal findings: Secondary | ICD-10-CM

## 2017-08-10 DIAGNOSIS — I10 Essential (primary) hypertension: Secondary | ICD-10-CM

## 2017-08-10 DIAGNOSIS — K760 Fatty (change of) liver, not elsewhere classified: Secondary | ICD-10-CM

## 2017-08-10 DIAGNOSIS — Z79899 Other long term (current) drug therapy: Secondary | ICD-10-CM

## 2017-08-10 DIAGNOSIS — Z125 Encounter for screening for malignant neoplasm of prostate: Secondary | ICD-10-CM

## 2017-08-11 LAB — COMPREHENSIVE METABOLIC PANEL
AG Ratio: 1.8 (calc) (ref 1.0–2.5)
ALKALINE PHOSPHATASE (APISO): 63 U/L (ref 40–115)
ALT: 40 U/L (ref 9–46)
AST: 30 U/L (ref 10–35)
Albumin: 4.4 g/dL (ref 3.6–5.1)
BILIRUBIN TOTAL: 0.4 mg/dL (ref 0.2–1.2)
BUN / CREAT RATIO: 23 (calc) — AB (ref 6–22)
BUN: 28 mg/dL — ABNORMAL HIGH (ref 7–25)
CALCIUM: 9.6 mg/dL (ref 8.6–10.3)
CHLORIDE: 100 mmol/L (ref 98–110)
CO2: 23 mmol/L (ref 20–32)
Creat: 1.22 mg/dL (ref 0.70–1.25)
Globulin: 2.4 g/dL (calc) (ref 1.9–3.7)
Glucose, Bld: 100 mg/dL — ABNORMAL HIGH (ref 65–99)
POTASSIUM: 4.4 mmol/L (ref 3.5–5.3)
Sodium: 133 mmol/L — ABNORMAL LOW (ref 135–146)
Total Protein: 6.8 g/dL (ref 6.1–8.1)

## 2017-08-11 LAB — CBC WITH DIFFERENTIAL/PLATELET
BASOS ABS: 68 {cells}/uL (ref 0–200)
Basophils Relative: 0.8 %
EOS PCT: 5.1 %
Eosinophils Absolute: 434 cells/uL (ref 15–500)
HEMATOCRIT: 38.6 % (ref 38.5–50.0)
Hemoglobin: 13.5 g/dL (ref 13.2–17.1)
LYMPHS ABS: 4004 {cells}/uL — AB (ref 850–3900)
MCH: 33.8 pg — AB (ref 27.0–33.0)
MCHC: 35 g/dL (ref 32.0–36.0)
MCV: 96.5 fL (ref 80.0–100.0)
MPV: 10.1 fL (ref 7.5–12.5)
Monocytes Relative: 8.8 %
NEUTROS PCT: 38.2 %
Neutro Abs: 3247 cells/uL (ref 1500–7800)
Platelets: 241 10*3/uL (ref 140–400)
RBC: 4 10*6/uL — ABNORMAL LOW (ref 4.20–5.80)
RDW: 11.4 % (ref 11.0–15.0)
Total Lymphocyte: 47.1 %
WBC: 8.5 10*3/uL (ref 3.8–10.8)
WBCMIX: 748 {cells}/uL (ref 200–950)

## 2017-08-11 LAB — LIPID PANEL
Cholesterol: 173 mg/dL (ref <200)
HDL: 36 mg/dL — ABNORMAL LOW (ref >40)
LDL Cholesterol (Calc): 109 mg/dL (calc) — ABNORMAL HIGH
Non-HDL Cholesterol (Calc): 137 mg/dL (calc) — ABNORMAL HIGH (ref <130)
TRIGLYCERIDES: 160 mg/dL — AB (ref <150)
Total CHOL/HDL Ratio: 4.8 (calc) (ref <5.0)

## 2017-08-11 LAB — HEMOGLOBIN A1C
HEMOGLOBIN A1C: 5.2 %{Hb} (ref <5.7)
Mean Plasma Glucose: 103 (calc)
eAG (mmol/L): 5.7 (calc)

## 2017-08-11 LAB — PSA: PSA: 0.2 ng/mL (ref <=4.0)

## 2017-08-17 ENCOUNTER — Other Ambulatory Visit: Payer: Self-pay

## 2017-08-17 ENCOUNTER — Ambulatory Visit (INDEPENDENT_AMBULATORY_CARE_PROVIDER_SITE_OTHER): Payer: Medicare Other | Admitting: Family Medicine

## 2017-08-17 ENCOUNTER — Encounter: Payer: Self-pay | Admitting: Family Medicine

## 2017-08-17 ENCOUNTER — Other Ambulatory Visit: Payer: Self-pay | Admitting: Family Medicine

## 2017-08-17 VITALS — BP 124/72 | HR 63 | Temp 97.9°F | Resp 16 | Ht 64.5 in | Wt 156.0 lb

## 2017-08-17 DIAGNOSIS — E119 Type 2 diabetes mellitus without complications: Secondary | ICD-10-CM

## 2017-08-17 DIAGNOSIS — I10 Essential (primary) hypertension: Secondary | ICD-10-CM

## 2017-08-17 DIAGNOSIS — Z0000 Encounter for general adult medical examination without abnormal findings: Secondary | ICD-10-CM | POA: Diagnosis not present

## 2017-08-17 DIAGNOSIS — K760 Fatty (change of) liver, not elsewhere classified: Secondary | ICD-10-CM | POA: Diagnosis not present

## 2017-08-17 NOTE — Progress Notes (Signed)
Subjective:    Patient ID: Bradley Baldwin, male    DOB: [birth year], 67 y.o.   MRN: [id number]  HPI Patient is here today for complete physical exam. His colonoscopy was performed in 01/2017 and was significant for 2 tubular adenomas. He is scheduled repeat colonoscopy in 2024.  Patient was screened for hepatitis C prior to joining epic.  Diabetic foot exam is performed today and is normal.  Immunizations are up-to-date as listed below: Immunization History  Administered Date(s) Administered  . Influenza, High Dose Seasonal PF 11/27/2016  . Influenza,inj,Quad PF,6+ Mos 01/27/2013, 11/12/2013, 12/01/2014, 11/18/2015  . Pneumococcal Conjugate-13 06/02/2016  . Pneumococcal Polysaccharide-23 05/29/2014  . Tdap 11/16/2009, 02/28/2013   We did discuss the shingles vaccine today and the patient again is instructed to check the price prior to receiving to ensure that he can afford the cost.  He is due for prostate cancer screening with a PSA which was in his lab work and was excellent at 0.3.  His most recent lab work is listed below. Appointment on 08/10/2017  Component Date Value Ref Range Status  . WBC 08/10/2017 8.5  3.8 - 10.8 Thousand/uL Final  . RBC 08/10/2017 4.00* 4.20 - 5.80 Million/uL Final  . Hemoglobin 08/10/2017 13.5  13.2 - 17.1 g/dL Final  . HCT 08/10/2017 38.6  38.5 - 50.0 % Final  . MCV 08/10/2017 96.5  80.0 - 100.0 fL Final  . MCH 08/10/2017 33.8* 27.0 - 33.0 pg Final  . MCHC 08/10/2017 35.0  32.0 - 36.0 g/dL Final  . RDW 08/10/2017 11.4  11.0 - 15.0 % Final  . Platelets 08/10/2017 241  140 - 400 Thousand/uL Final  . MPV 08/10/2017 10.1  7.5 - 12.5 fL Final  . Neutro Abs 08/10/2017 3,247  1,500 - 7,800 cells/uL Final  . Lymphs Abs 08/10/2017 4,004* 850 - 3,900 cells/uL Final  . WBC mixed population 08/10/2017 748  200 - 950 cells/uL Final  . Eosinophils Absolute 08/10/2017 434  15 - 500 cells/uL Final  . Basophils Absolute 08/10/2017 68  0 - 200 cells/uL Final  .  Neutrophils Relative % 08/10/2017 38.2  % Final  . Total Lymphocyte 08/10/2017 47.1  % Final  . Monocytes Relative 08/10/2017 8.8  % Final  . Eosinophils Relative 08/10/2017 5.1  % Final  . Basophils Relative 08/10/2017 0.8  % Final  . Glucose, Bld 08/10/2017 100* 65 - 99 mg/dL Final   Comment: .            Fasting reference interval . For someone without known diabetes, a glucose value between 100 and 125 mg/dL is consistent with prediabetes and should be confirmed with a follow-up test. .   . BUN 08/10/2017 28* 7 - 25 mg/dL Final  . Creat 08/10/2017 1.22  0.70 - 1.25 mg/dL Final   Comment: For patients >75 years of age, the reference limit for Creatinine is approximately 13% higher for people identified as African-American. .   Havery Moros Ratio 08/10/2017 23* 6 - 22 (calc) Final  . Sodium 08/10/2017 133* 135 - 146 mmol/L Final  . Potassium 08/10/2017 4.4  3.5 - 5.3 mmol/L Final  . Chloride 08/10/2017 100  98 - 110 mmol/L Final  . CO2 08/10/2017 23  20 - 32 mmol/L Final  . Calcium 08/10/2017 9.6  8.6 - 10.3 mg/dL Final  . Total Protein 08/10/2017 6.8  6.1 - 8.1 g/dL Final  . Albumin 08/10/2017 4.4  3.6 - 5.1 g/dL Final  . Globulin 08/10/2017  2.4  1.9 - 3.7 g/dL (calc) Final  . AG Ratio 08/10/2017 1.8  1.0 - 2.5 (calc) Final  . Total Bilirubin 08/10/2017 0.4  0.2 - 1.2 mg/dL Final  . Alkaline phosphatase (APISO) 08/10/2017 63  40 - 115 U/L Final  . AST 08/10/2017 30  10 - 35 U/L Final  . ALT 08/10/2017 40  9 - 46 U/L Final  . Hgb A1c MFr Bld 08/10/2017 5.2  <5.7 % of total Hgb Final   Comment: For the purpose of screening for the presence of diabetes: . <5.7%       Consistent with the absence of diabetes 5.7-6.4%    Consistent with increased risk for diabetes             (prediabetes) > or =6.5%  Consistent with diabetes . This assay result is consistent with a decreased risk of diabetes. . Currently, no consensus exists regarding use of hemoglobin A1c for  diagnosis of diabetes in children. . According to American Diabetes Association (ADA) guidelines, hemoglobin A1c <7.0% represents optimal control in non-pregnant diabetic patients. Different metrics may apply to specific patient populations.  Standards of Medical Care in Diabetes(ADA). .   . Mean Plasma Glucose 08/10/2017 103  (calc) Final  . eAG (mmol/L) 08/10/2017 5.7  (calc) Final  . Cholesterol 08/10/2017 173  <200 mg/dL Final  . HDL 08/10/2017 36* >40 mg/dL Final  . Triglycerides 08/10/2017 160* <150 mg/dL Final  . LDL Cholesterol (Calc) 08/10/2017 109* mg/dL (calc) Final   Comment: Reference range: <100 . Desirable range <100 mg/dL for primary prevention;   <70 mg/dL for patients with CHD or diabetic patients  with > or = 2 CHD risk factors. Marland Kitchen LDL-C is now calculated using the Martin-Hopkins  calculation, which is a validated novel method providing  better accuracy than the Friedewald equation in the  estimation of LDL-C.  Cresenciano Genre et al. Annamaria Helling. 0539;767(34): 2061-2068  (http://education.[facility].com/faq/FAQ164)   . Total CHOL/HDL Ratio 08/10/2017 4.8  <5.0 (calc) Final  . Non-HDL Cholesterol (Calc) 08/10/2017 137* <130 mg/dL (calc) Final   Comment: For patients with diabetes plus 1 major ASCVD risk  factor, treating to a non-HDL-C goal of <100 mg/dL  (LDL-C of <70 mg/dL) is considered a therapeutic  option.   Marland Kitchen PSA 08/10/2017 0.2  < OR = 4.0 ng/mL Final   Comment: The total PSA value from this assay system is  standardized against the WHO standard. The test  result will be approximately 20% lower when compared  to the equimolar-standardized total PSA (Beckman  Coulter). Comparison of serial PSA results should be  interpreted with this fact in mind. . This test was performed using the Siemens  chemiluminescent method. Values obtained from  different assay methods cannot be used interchangeably. PSA levels, regardless of value, should not be interpreted as  absolute evidence of the presence or absence of disease.     Past Medical History:  Diagnosis Date  . Arthritis    "starting to get achy joints here and there" (11/28/2011)  . Colon polyps   . External bleeding hemorrhoids    "occasionally" (11/28/2011)  . Fatty liver disease, nonalcoholic   . GERD (gastroesophageal reflux disease)   . Hypertension    pcp   dr pickerd   [place]  . Prediabetes    Past Surgical History:  Procedure Laterality Date  . 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 TOTAL HIP ARTHROPLASTY  11/28/2011   Procedure: TOTAL HIP ARTHROPLASTY ANTERIOR APPROACH;  Surgeon: Hessie Dibble, MD;  Location: [facility];  Service: Orthopedics;  Laterality: Right;   Current Outpatient Medications on File Prior to Visit  Medication Sig Dispense Refill  . amLODipine (NORVASC) 10 MG tablet TAKE 1 TABLET BY MOUTH DAILY. 90 tablet 2  . aspirin EC 81 MG tablet Take 81 mg by mouth daily.    Marland Kitchen atenolol (TENORMIN) 50 MG tablet TAKE 1 TABLET BY MOUTH DAILY. 90 tablet 3  . fluticasone (FLONASE) 50 MCG/ACT nasal spray Place 2 sprays into the nose daily. (Patient taking differently: Place 2 sprays into the nose daily. PRN) 16 g 6  . glucose blood test strip Check bs qd E11.9 100 each 12  . hydrochlorothiazide (MICROZIDE) 12.5 MG capsule TAKE 1 CAPSULE BY MOUTH DAILY. 90 capsule 3  . HYDROcodone-homatropine (HYCODAN) 5-1.5 MG/5ML syrup Take 5 mLs by mouth every 6 (six) hours as needed for cough. 120 mL 0  . Lancets (ONETOUCH ULTRASOFT) lancets Use as instructed 100 each 12  . lisinopril (PRINIVIL,ZESTRIL) 40 MG tablet TAKE 1 TABLET BY MOUTH DAILY. 90 tablet 3  . metFORMIN (GLUCOPHAGE) 1000 MG tablet take 1 tablet by mouth twice daily with meals 60 tablet 1  . sildenafil (REVATIO) 20 MG tablet TAKE 3 TO 5 TABLETS BY MOUTH DAILY AS NEEDED 30 tablet 2   No current facility-administered medications on file prior to visit.    No Known Allergies Social History   Socioeconomic  History  . Marital status: Married    Spouse name: Not on file  . Number of children: Not on file  . Years of education: Not on file  . Highest education level: Not on file  Occupational History  . Not on file  Social Needs  . Financial resource strain: Not on file  . Food insecurity:    Worry: Not on file    Inability: Not on file  . Transportation needs:    Medical: Not on file    Non-medical: Not on file  Tobacco Use  . Smoking status: Former Smoker    Packs/day: 2.00    Years: 10.00    Pack years: 20.00    Types: Cigarettes  . Smokeless tobacco: Former Systems developer    Types: Chew  . Tobacco comment: 11/28/2011 "quit smoking ~ 30 yr ago; chewed some then quit that"  Substance and Sexual Activity  . Alcohol use: Yes    Alcohol/week: 3.6 oz    Types: 6 Cans of beer per week    Comment: 11/28/2011 "drink a few beers 3 times/wk; ~ 6pk total/wk"  . Drug use: No  . Sexual activity: Yes  Lifestyle  . Physical activity:    Days per week: Not on file    Minutes per session: Not on file  . Stress: Not on file  Relationships  . Social connections:    Talks on phone: Not on file    Gets together: Not on file    Attends religious service: Not on file    Active member of club or organization: Not on file    Attends meetings of clubs or organizations: Not on file    Relationship status: Not on file  . Intimate partner violence:    Fear of current or ex partner: Not on file    Emotionally abused: Not on file    Physically abused: Not on file    Forced sexual activity: Not on file  Other Topics Concern  . Not on file  Social History Narrative  . Not  on file   No family history on file.' Appointment on 08/10/2017  Component Date Value Ref Range Status  . WBC 08/10/2017 8.5  3.8 - 10.8 Thousand/uL Final  . RBC 08/10/2017 4.00* 4.20 - 5.80 Million/uL Final  . Hemoglobin 08/10/2017 13.5  13.2 - 17.1 g/dL Final  . HCT 08/10/2017 38.6  38.5 - 50.0 % Final  . MCV 08/10/2017 96.5   80.0 - 100.0 fL Final  . MCH 08/10/2017 33.8* 27.0 - 33.0 pg Final  . MCHC 08/10/2017 35.0  32.0 - 36.0 g/dL Final  . RDW 08/10/2017 11.4  11.0 - 15.0 % Final  . Platelets 08/10/2017 241  140 - 400 Thousand/uL Final  . MPV 08/10/2017 10.1  7.5 - 12.5 fL Final  . Neutro Abs 08/10/2017 3,247  1,500 - 7,800 cells/uL Final  . Lymphs Abs 08/10/2017 4,004* 850 - 3,900 cells/uL Final  . WBC mixed population 08/10/2017 748  200 - 950 cells/uL Final  . Eosinophils Absolute 08/10/2017 434  15 - 500 cells/uL Final  . Basophils Absolute 08/10/2017 68  0 - 200 cells/uL Final  . Neutrophils Relative % 08/10/2017 38.2  % Final  . Total Lymphocyte 08/10/2017 47.1  % Final  . Monocytes Relative 08/10/2017 8.8  % Final  . Eosinophils Relative 08/10/2017 5.1  % Final  . Basophils Relative 08/10/2017 0.8  % Final  . Glucose, Bld 08/10/2017 100* 65 - 99 mg/dL Final   Comment: .            Fasting reference interval . For someone without known diabetes, a glucose value between 100 and 125 mg/dL is consistent with prediabetes and should be confirmed with a follow-up test. .   . BUN 08/10/2017 28* 7 - 25 mg/dL Final  . Creat 08/10/2017 1.22  0.70 - 1.25 mg/dL Final   Comment: For patients >72 years of age, the reference limit for Creatinine is approximately 13% higher for people identified as African-American. .   Havery Moros Ratio 08/10/2017 23* 6 - 22 (calc) Final  . Sodium 08/10/2017 133* 135 - 146 mmol/L Final  . Potassium 08/10/2017 4.4  3.5 - 5.3 mmol/L Final  . Chloride 08/10/2017 100  98 - 110 mmol/L Final  . CO2 08/10/2017 23  20 - 32 mmol/L Final  . Calcium 08/10/2017 9.6  8.6 - 10.3 mg/dL Final  . Total Protein 08/10/2017 6.8  6.1 - 8.1 g/dL Final  . Albumin 08/10/2017 4.4  3.6 - 5.1 g/dL Final  . Globulin 08/10/2017 2.4  1.9 - 3.7 g/dL (calc) Final  . AG Ratio 08/10/2017 1.8  1.0 - 2.5 (calc) Final  . Total Bilirubin 08/10/2017 0.4  0.2 - 1.2 mg/dL Final  . Alkaline phosphatase  (APISO) 08/10/2017 63  40 - 115 U/L Final  . AST 08/10/2017 30  10 - 35 U/L Final  . ALT 08/10/2017 40  9 - 46 U/L Final  . Hgb A1c MFr Bld 08/10/2017 5.2  <5.7 % of total Hgb Final   Comment: For the purpose of screening for the presence of diabetes: . <5.7%       Consistent with the absence of diabetes 5.7-6.4%    Consistent with increased risk for diabetes             (prediabetes) > or =6.5%  Consistent with diabetes . This assay result is consistent with a decreased risk of diabetes. . Currently, no consensus exists regarding use of hemoglobin A1c for diagnosis of diabetes in children. . According to  American Diabetes Association (ADA) guidelines, hemoglobin A1c <7.0% represents optimal control in non-pregnant diabetic patients. Different metrics may apply to specific patient populations.  Standards of Medical Care in Diabetes(ADA). .   . Mean Plasma Glucose 08/10/2017 103  (calc) Final  . eAG (mmol/L) 08/10/2017 5.7  (calc) Final  . Cholesterol 08/10/2017 173  <200 mg/dL Final  . HDL 08/10/2017 36* >40 mg/dL Final  . Triglycerides 08/10/2017 160* <150 mg/dL Final  . LDL Cholesterol (Calc) 08/10/2017 109* mg/dL (calc) Final   Comment: Reference range: <100 . Desirable range <100 mg/dL for primary prevention;   <70 mg/dL for patients with CHD or diabetic patients  with > or = 2 CHD risk factors. Marland Kitchen LDL-C is now calculated using the Martin-Hopkins  calculation, which is a validated novel method providing  better accuracy than the Friedewald equation in the  estimation of LDL-C.  Cresenciano Genre et al. Annamaria Helling. 2536;644(03): 2061-2068  (http://education.[facility].com/faq/FAQ164)   . Total CHOL/HDL Ratio 08/10/2017 4.8  <5.0 (calc) Final  . Non-HDL Cholesterol (Calc) 08/10/2017 137* <130 mg/dL (calc) Final   Comment: For patients with diabetes plus 1 major ASCVD risk  factor, treating to a non-HDL-C goal of <100 mg/dL  (LDL-C of <70 mg/dL) is considered a therapeutic    option.   Marland Kitchen PSA 08/10/2017 0.2  < OR = 4.0 ng/mL Final   Comment: The total PSA value from this assay system is  standardized against the WHO standard. The test  result will be approximately 20% lower when compared  to the equimolar-standardized total PSA (Beckman  Coulter). Comparison of serial PSA results should be  interpreted with this fact in mind. . This test was performed using the Siemens  chemiluminescent method. Values obtained from  different assay methods cannot be used interchangeably. PSA levels, regardless of value, should not be interpreted as absolute evidence of the presence or absence of disease.      Review of Systems  All other systems reviewed and are negative.      Objective:   Physical Exam  Constitutional: He is oriented to person, place, and time. He appears well-developed and well-nourished. No distress.  HENT:  Head: Normocephalic and atraumatic.  Right Ear: External ear normal.  Left Ear: External ear normal.  Nose: Nose normal.  Mouth/Throat: Oropharynx is clear and moist. No oropharyngeal exudate.  Eyes: Pupils are equal, round, and reactive to light. Conjunctivae and EOM are normal. Right eye exhibits no discharge. Left eye exhibits no discharge. No scleral icterus.  Neck: Normal range of motion. Neck supple. No JVD present. No tracheal deviation present. No thyromegaly present.  Cardiovascular: Normal rate, regular rhythm, normal heart sounds and intact distal pulses. Exam reveals no gallop and no friction rub.  No murmur heard. Pulmonary/Chest: Effort normal and breath sounds normal. No stridor. No respiratory distress. He has no wheezes. He has no rales. He exhibits no tenderness.  Abdominal: Soft. Bowel sounds are normal. He exhibits no distension and no mass. There is no tenderness. There is no rebound and no guarding.  Genitourinary: Rectum normal and prostate normal.  Musculoskeletal: Normal range of motion. He exhibits no edema or  tenderness.  Lymphadenopathy:    He has no cervical adenopathy.  Neurological: He is alert and oriented to person, place, and time. He has normal reflexes. No cranial nerve deficit. He exhibits normal muscle tone. Coordination normal.  Skin: Skin is warm. No rash noted. He is not diaphoretic. No erythema. No pallor.  Psychiatric: He has a normal mood  and affect. His behavior is normal. Judgment and thought content normal.  Vitals reviewed.         Assessment & Plan:  Routine general medical examination at a health care facility  Fatty liver disease, nonalcoholic  Benign essential HTN  Controlled type 2 diabetes mellitus without complication, without long-term current use of insulin ([place])  Physical exam is completely normal today.  Prostate cancer screening is completed using a PSA which was excellent at 0.3.  Colonoscopy is up-to-date.  Hepatitis C screening was performed prior to joining epic.  Immunizations are up-to-date except for the shingles vaccine which I encouraged the patient to receive if the price is affordable.  Hemoglobin A1c is well controlled and I recommended that he reduce his dose of metformin by 50% and then we recheck his lab work in 3 months.  He possibly can discontinue this medication altogether.  LDL cholesterol however is elevated at 109, HDL cholesterol is low, triglycerides are elevated.  He also has mild fatty liver disease.  For all these reasons, I recommended the patient begin an exercise regimen including 30 minutes a day 5 days a week of aerobic exercise and try to accomplish 10 to 15 pounds weight loss.  I believe that he can accomplish this his dyslipidemia with improved dramatically along with his fatty liver disease.  Recommended annual diabetic eye exam.

## 2017-09-04 ENCOUNTER — Other Ambulatory Visit: Payer: Self-pay | Admitting: Family Medicine

## 2017-09-27 ENCOUNTER — Other Ambulatory Visit: Payer: Self-pay | Admitting: Family Medicine

## 2017-10-19 ENCOUNTER — Ambulatory Visit (INDEPENDENT_AMBULATORY_CARE_PROVIDER_SITE_OTHER): Payer: Medicare Other

## 2017-10-19 DIAGNOSIS — Z23 Encounter for immunization: Secondary | ICD-10-CM

## 2017-10-19 NOTE — Progress Notes (Signed)
Patient was in office for a flu vaccine.Patient received the vaccine in his right deltoid. Patient tolerated well

## 2017-10-22 ENCOUNTER — Other Ambulatory Visit: Payer: Self-pay | Admitting: Family Medicine

## 2017-10-22 DIAGNOSIS — I10 Essential (primary) hypertension: Secondary | ICD-10-CM

## 2017-10-24 DIAGNOSIS — H43811 Vitreous degeneration, right eye: Secondary | ICD-10-CM | POA: Diagnosis not present

## 2017-10-24 DIAGNOSIS — H5203 Hypermetropia, bilateral: Secondary | ICD-10-CM | POA: Diagnosis not present

## 2017-10-24 DIAGNOSIS — E119 Type 2 diabetes mellitus without complications: Secondary | ICD-10-CM | POA: Diagnosis not present

## 2017-10-24 DIAGNOSIS — H25813 Combined forms of age-related cataract, bilateral: Secondary | ICD-10-CM | POA: Diagnosis not present

## 2017-10-24 LAB — HM DIABETES EYE EXAM

## 2017-11-05 ENCOUNTER — Other Ambulatory Visit: Payer: Self-pay | Admitting: *Deleted

## 2017-11-16 DIAGNOSIS — M545 Low back pain: Secondary | ICD-10-CM | POA: Diagnosis not present

## 2017-11-16 DIAGNOSIS — M25551 Pain in right hip: Secondary | ICD-10-CM | POA: Diagnosis not present

## 2017-11-20 DIAGNOSIS — M9904 Segmental and somatic dysfunction of sacral region: Secondary | ICD-10-CM | POA: Diagnosis not present

## 2017-11-20 DIAGNOSIS — M545 Low back pain: Secondary | ICD-10-CM | POA: Diagnosis not present

## 2017-11-20 DIAGNOSIS — M9903 Segmental and somatic dysfunction of lumbar region: Secondary | ICD-10-CM | POA: Diagnosis not present

## 2017-11-20 DIAGNOSIS — M461 Sacroiliitis, not elsewhere classified: Secondary | ICD-10-CM | POA: Diagnosis not present

## 2017-12-05 ENCOUNTER — Other Ambulatory Visit: Payer: Self-pay | Admitting: Family Medicine

## 2018-01-24 ENCOUNTER — Ambulatory Visit (INDEPENDENT_AMBULATORY_CARE_PROVIDER_SITE_OTHER): Payer: Medicare Other | Admitting: Family Medicine

## 2018-01-24 ENCOUNTER — Encounter: Payer: Self-pay | Admitting: Family Medicine

## 2018-01-24 VITALS — BP 150/82 | HR 60 | Temp 98.4°F | Resp 15 | Wt 175.0 lb

## 2018-01-24 DIAGNOSIS — J329 Chronic sinusitis, unspecified: Secondary | ICD-10-CM | POA: Diagnosis not present

## 2018-01-24 DIAGNOSIS — J209 Acute bronchitis, unspecified: Secondary | ICD-10-CM

## 2018-01-24 MED ORDER — GUAIFENESIN ER 600 MG PO TB12: 600.0000 mg | ORAL_TABLET | Freq: Two times a day (BID) | ORAL | 0 refills | Status: AC

## 2018-01-24 MED ORDER — BENZONATATE 100 MG PO CAPS: 100.0000 mg | ORAL_CAPSULE | Freq: Three times a day (TID) | ORAL | 0 refills | Status: DC | PRN

## 2018-01-24 MED ORDER — IPRATROPIUM-ALBUTEROL 0.5-2.5 (3) MG/3ML IN SOLN: 3.0000 mL | Freq: Once | RESPIRATORY_TRACT | Status: DC

## 2018-01-24 MED ORDER — CETIRIZINE HCL 10 MG PO TBDP: 10.0000 mg | ORAL_TABLET | Freq: Every day | ORAL | 0 refills | Status: AC

## 2018-01-24 MED ORDER — MOMETASONE FUROATE 50 MCG/ACT NA SUSP: 2.0000 | Freq: Every day | NASAL | 1 refills | Status: DC

## 2018-01-24 MED ORDER — PREDNISONE 20 MG PO TABS: 40.0000 mg | ORAL_TABLET | Freq: Every day | ORAL | 0 refills | Status: AC

## 2018-01-24 MED ORDER — ALBUTEROL SULFATE HFA 108 (90 BASE) MCG/ACT IN AERS: 2.0000 | INHALATION_SPRAY | RESPIRATORY_TRACT | 0 refills | Status: DC | PRN

## 2018-01-24 MED ORDER — AZITHROMYCIN 250 MG PO TABS: ORAL_TABLET | ORAL | 0 refills | Status: DC

## 2018-01-24 NOTE — Patient Instructions (Signed)
To treat your nasal symptoms and postnasal drip -use a daily steroid nasal spray like Flonase or Nasonex, use saline nasal spray a few times a day to help moisten and irrigate nasal mucosa.  And use an over-the-counter antihistamine daily such as Claritin Zyrtec or Allegra.  Using the steroid nasal spray and antihistamine daily for 1 to 2 weeks before seeing the improvement in nasal allergy symptoms.  If your nasal symptoms get worse with any sinus pain pressure or fever please let us know you would need a different antibiotic to treat a sinus infection  For the cough which I do believe is bronchitis or bronchospasm -I think it will improve if you can get the postnasal drip to stop and the wheeze in your lungs will improve with steroids and inhaler.  Z-Pak helps kill bacteria and it has an antiinflammatory effect in lungs, so it should help with your breathing and coughing.  Use mucinex to help dry up mucous as well.  Drink ample clear fluids.  Follow up if not improving in 2 weeks.

## 2018-01-24 NOTE — Progress Notes (Signed)
Patient ID: Bradley Baldwin, male    DOB: [birth year], 67 y.o.   MRN: [id number]  PCP: Susy Frizzle, MD  Chief Complaint  Patient presents with  . Cough    Paitent in today with c/o productive cough, post nasal drip. Onset 3 weeks ago.    Subjective:   Bradley Baldwin is a 67 y.o. male, presents to clinic with CC of 3 weeks of cold and cough sx.  Started with nasal discharge, congestion and post nasal drip and quickly developed productive frequent cough with yellow to green sputum.  He uses Flonase sometimes and has been using over-the-counter cough medicine but his symptoms have not gotten better.  He has a scratchy throat that constantly feels like he has to clear drainage and congestion and causes coughing.  He does have coughing fits where it is hard to catch his breath sometimes but when he is not coughing he denies any shortness of breath.  Currently denies sore throat, fever, chills, sweats.  He denies any chest pain, nausea, abdominal pain, back pain, loose stools.  He states that he gets bronchitis about twice a year usually gets better with a Z-Pak.  He is taking a cough medicine and something over-the-counter with an antihistamine but that has not helped his symptoms.  Several decades ago he smoked for 10 years.  He does not have any history of asthma or lung disease.  He has prediabetes and hypertension - both well controlled.    Patient Active Problem List   Diagnosis Date Noted  . Fatty liver disease, nonalcoholic   . Prediabetes   . Colon polyps   . DJD (degenerative joint disease) of hip 11/28/2011    Class: Chronic     Prior to Admission medications   Medication Sig Start Date End Date Taking? Authorizing Provider  amLODipine (NORVASC) 10 MG tablet TAKE ONE TABLET BY MOUTH ONE TIME DAILY  10/22/17  Yes Susy Frizzle, MD  aspirin EC 81 MG tablet Take 81 mg by mouth daily.   Yes [provider]  atenolol (TENORMIN) 50 MG tablet TAKE ONE TABLET BY MOUTH  ONE TIME DAILY  12/05/17  Yes Susy Frizzle, MD  fluticasone (FLONASE) 50 MCG/ACT nasal spray Place 2 sprays into the nose daily. Patient taking differently: Place 2 sprays into the nose daily. PRN 08/22/12  Yes Dena Billet B, PA-C  glucose blood test strip Check bs qd E11.9 12/15/15  Yes Susy Frizzle, MD  hydrochlorothiazide (MICROZIDE) 12.5 MG capsule TAKE ONE CAPSULE BY MOUTH ONE TIME DAILY  09/28/17  Yes Susy Frizzle, MD  Lancets [facility] ULTRASOFT) lancets Use as instructed 12/15/15  Yes Susy Frizzle, MD  lisinopril (PRINIVIL,ZESTRIL) 40 MG tablet TAKE ONE TABLET BY MOUTH ONE TIME DAILY  09/04/17  Yes Susy Frizzle, MD  metFORMIN (GLUCOPHAGE) 1000 MG tablet TAKE 1 TABLET BY MOUTH TWICE DAILY WEITH MEALS 08/20/17  Yes Susy Frizzle, MD  sildenafil (REVATIO) 20 MG tablet TAKE 3 TO 5 TABLETS BY MOUTH DAILY AS NEEDED 04/26/17  Yes Susy Frizzle, MD     No Known Allergies   History reviewed. No pertinent family history.   Social History   Socioeconomic History  . Marital status: Married    Spouse name: Not on file  . Number of children: Not on file  . Years of education: Not on file  . Highest education level: Not on file  Occupational History  . Not on file  Social Needs  . Financial resource strain: Not on file  . Food insecurity:    Worry: Not on file    Inability: Not on file  . Transportation needs:    Medical: Not on file    Non-medical: Not on file  Tobacco Use  . Smoking status: Former Smoker    Packs/day: 2.00    Years: 10.00    Pack years: 20.00    Types: Cigarettes  . Smokeless tobacco: Former Systems developer    Types: Chew  . Tobacco comment: 11/28/2011 "quit smoking ~ 30 yr ago; chewed some then quit that"  Substance and Sexual Activity  . Alcohol use: Yes    Alcohol/week: 6.0 standard drinks    Types: 6 Cans of beer per week    Comment: 11/28/2011 "drink a few beers 3 times/wk; ~ 6pk total/wk"  . Drug use: No  . Sexual activity: Yes    Lifestyle  . Physical activity:    Days per week: Not on file    Minutes per session: Not on file  . Stress: Not on file  Relationships  . Social connections:    Talks on phone: Not on file    Gets together: Not on file    Attends religious service: Not on file    Active member of club or organization: Not on file    Attends meetings of clubs or organizations: Not on file    Relationship status: Not on file  . Intimate partner violence:    Fear of current or ex partner: Not on file    Emotionally abused: Not on file    Physically abused: Not on file    Forced sexual activity: Not on file  Other Topics Concern  . Not on file  Social History Narrative  . Not on file     Review of Systems  Constitutional: Negative.  Negative for activity change, appetite change, chills, diaphoresis, fatigue and fever.  HENT: Negative.   Eyes: Negative.  Negative for photophobia, pain, discharge, redness and itching.  Respiratory: Positive for cough. Negative for choking, chest tightness, shortness of breath, wheezing and stridor.   Cardiovascular: Negative.  Negative for chest pain, palpitations and leg swelling.  Gastrointestinal: Negative.   Endocrine: Negative.   Genitourinary: Negative.   Musculoskeletal: Negative.   Skin: Negative.   Allergic/Immunologic: Negative.   Neurological: Negative.   Hematological: Negative.   Psychiatric/Behavioral: Negative.   All other systems reviewed and are negative.      Objective:    Vitals:   01/24/18 1011  BP: (!) 150/82  Pulse: 60  Resp: 15  Temp: 98.4 F (36.9 C)  TempSrc: Oral  SpO2: 98%  Weight: 175 lb (79.4 kg)      Physical Exam Vitals signs and nursing note reviewed.  Constitutional:      General: He is not in acute distress.    Appearance: Normal appearance. He is well-developed. He is ill-appearing (mildly ill). He is not toxic-appearing or diaphoretic.     Comments: Appears slightly older than stated age, frequently  clearing throat and coughing  HENT:     Head: Normocephalic and atraumatic.     Jaw: No trismus.     Right Ear: Tympanic membrane, ear canal and external ear normal.     Left Ear: Tympanic membrane, ear canal and external ear normal.     Nose: Mucosal edema, congestion and rhinorrhea present.     Right Sinus: No maxillary sinus tenderness or frontal sinus tenderness.  Left Sinus: No maxillary sinus tenderness or frontal sinus tenderness.     Mouth/Throat:     Mouth: Mucous membranes are moist. Mucous membranes are not pale, not dry and not cyanotic.     Pharynx: Uvula midline. Posterior oropharyngeal erythema present. No oropharyngeal exudate or uvula swelling.     Tonsils: No tonsillar exudate or tonsillar abscesses.  Eyes:     General: Lids are normal.        Right eye: No discharge.        Left eye: No discharge.     Conjunctiva/sclera: Conjunctivae normal.     Pupils: Pupils are equal, round, and reactive to light.  Neck:     Musculoskeletal: Normal range of motion and neck supple.     Trachea: Trachea and phonation normal. No tracheal deviation.  Cardiovascular:     Rate and Rhythm: Normal rate and regular rhythm.     Pulses: Normal pulses.          Radial pulses are 2+ on the right side and 2+ on the left side.     Heart sounds: Normal heart sounds. No murmur. No friction rub. No gallop.   Pulmonary:     Effort: Pulmonary effort is normal. No tachypnea, accessory muscle usage or respiratory distress.     Breath sounds: No stridor. Wheezing and rhonchi present. No decreased breath sounds or rales.     Comments: Scattered rhonchi and inspiratory and expiratory wheeze that worsens with and after coughing.  No rales.   Abdominal:     General: Bowel sounds are normal. There is no distension.     Palpations: Abdomen is soft.     Tenderness: There is no abdominal tenderness.  Musculoskeletal: Normal range of motion.  Lymphadenopathy:     Cervical: No cervical adenopathy.    Skin:    General: Skin is warm and dry.     Capillary Refill: Capillary refill takes less than 2 seconds.     Coloration: Skin is not pale.     Findings: No erythema or rash.     Nails: There is no clubbing.   Neurological:     Mental Status: He is alert and oriented to person, place, and time.     Motor: No abnormal muscle tone.     Coordination: Coordination normal.     Gait: Gait normal.  Psychiatric:        Speech: Speech normal.        Behavior: Behavior normal. Behavior is cooperative.           Assessment & Plan:   67 year old male presents with 3 weeks of head and chest cold, has a lot of postnasal drainage with constant congestion in his neck and throat associated with scratchy voice, persistent clearing of his throat and coughing, he denies any fever chills sweats wheeze or shortness of breath with any exertion.    On exam he did have bilateral expiratory wheeze initially in his mid to lower lung fields, I did have him cough to try and clear it to see if it was more rhonchorous that would clear or more of a wheeze and his wheeze did increase with coughing and increased inspiratory effort he then had inspiratory and expiratory wheeze in bilateral mid to lower lung fields.  I gave him a breathing treatment in clinic and he was reexamined, his wheeze cleared and coughing frequency did seem to improve, but he did have very coarse and diminished breath sounds particularly to the right  lower lung field.  We will treat him for acute bronchitis and possible community-acquired pneumonia with Z-Pak - steroid and inhaler for coughing fits and wheeze.  He has no sinus tenderness to palpation although he is very congested with erythematous and edematous nasal mucosa with profuse discharge.  Encouraged to use OTC meds for sinus congestion and discharge including steroid nasal sprays, antihistamines.  I did discuss with him that if he had any fever, worsening congestion or pain to his sinuses  cheeks or forehead that he needs to let us know because I would need to give him an different antibiotic to treat sinusitis.    ICD-10-CM   1. Acute bronchitis, unspecified organism J20.9 ipratropium-albuterol (DUONEB) 0.5-2.5 (3) MG/3ML nebulizer solution 3 mL   tx with steroid burst, zpak, inhaler, mucinex  2. Rhinosinusitis J32.9    steroid nasal spray, zyrtec, saline nasal spray.  Follow up with any worsening - sinus pain/pressure/HA/fever     Patient was also encouraged to follow-up in 2 weeks if breathing or coughing is not much better would want to reexamine him and if any congestion, diminished breath sounds or wheeze persisted possibly get a chest x-ray.  Delsa Grana, PA-C 01/24/18 10:18 AM

## 2018-02-11 ENCOUNTER — Other Ambulatory Visit: Payer: Self-pay | Admitting: Family Medicine

## 2018-03-11 ENCOUNTER — Other Ambulatory Visit: Payer: Self-pay | Admitting: Family Medicine

## 2018-04-09 ENCOUNTER — Other Ambulatory Visit: Payer: Self-pay | Admitting: Family Medicine

## 2018-04-28 ENCOUNTER — Encounter: Payer: Self-pay | Admitting: Family Medicine

## 2018-04-28 DIAGNOSIS — I10 Essential (primary) hypertension: Secondary | ICD-10-CM

## 2018-04-29 MED ORDER — AMLODIPINE BESYLATE 10 MG PO TABS: 10.0000 mg | ORAL_TABLET | Freq: Every day | ORAL | 1 refills | Status: DC

## 2018-06-06 ENCOUNTER — Encounter: Payer: Self-pay | Admitting: Family Medicine

## 2018-06-06 MED ORDER — ATENOLOL 50 MG PO TABS: 50.0000 mg | ORAL_TABLET | Freq: Every day | ORAL | 1 refills | Status: DC

## 2018-06-06 MED ORDER — METFORMIN HCL 1000 MG PO TABS: 1000.0000 mg | ORAL_TABLET | Freq: Two times a day (BID) | ORAL | 1 refills | Status: DC

## 2018-06-06 MED ORDER — LISINOPRIL 40 MG PO TABS: 40.0000 mg | ORAL_TABLET | Freq: Every day | ORAL | 1 refills | Status: DC

## 2018-07-19 ENCOUNTER — Encounter: Payer: Self-pay | Admitting: Family Medicine

## 2018-07-19 MED ORDER — HYDROCHLOROTHIAZIDE 12.5 MG PO CAPS: 12.5000 mg | ORAL_CAPSULE | Freq: Every day | ORAL | 0 refills | Status: DC

## 2018-10-14 ENCOUNTER — Encounter: Payer: Self-pay | Admitting: Family Medicine

## 2018-10-15 ENCOUNTER — Other Ambulatory Visit: Payer: Self-pay | Admitting: Family Medicine

## 2018-10-22 ENCOUNTER — Other Ambulatory Visit: Payer: Self-pay | Admitting: Family Medicine

## 2018-10-22 ENCOUNTER — Other Ambulatory Visit: Payer: Self-pay

## 2018-10-22 ENCOUNTER — Ambulatory Visit (INDEPENDENT_AMBULATORY_CARE_PROVIDER_SITE_OTHER): Payer: Medicare Other

## 2018-10-22 ENCOUNTER — Other Ambulatory Visit: Payer: Medicare Other

## 2018-10-22 DIAGNOSIS — E119 Type 2 diabetes mellitus without complications: Secondary | ICD-10-CM

## 2018-10-22 DIAGNOSIS — I10 Essential (primary) hypertension: Secondary | ICD-10-CM

## 2018-10-22 DIAGNOSIS — Z23 Encounter for immunization: Secondary | ICD-10-CM | POA: Diagnosis not present

## 2018-10-22 NOTE — Progress Notes (Signed)
Patient came in today to receive his annual flu shot. Patient was given fluad in his left deltoid. Patient tolerated well. VIS given.

## 2018-10-23 LAB — COMPREHENSIVE METABOLIC PANEL
AG Ratio: 1.5 (calc) (ref 1.0–2.5)
ALT: 85 U/L — ABNORMAL HIGH (ref 9–46)
AST: 67 U/L — ABNORMAL HIGH (ref 10–35)
Albumin: 4.3 g/dL (ref 3.6–5.1)
Alkaline phosphatase (APISO): 60 U/L (ref 35–144)
BUN/Creatinine Ratio: 19 (calc) (ref 6–22)
BUN: 25 mg/dL (ref 7–25)
CO2: 27 mmol/L (ref 20–32)
Calcium: 9.9 mg/dL (ref 8.6–10.3)
Chloride: 103 mmol/L (ref 98–110)
Creat: 1.35 mg/dL — ABNORMAL HIGH (ref 0.70–1.25)
Globulin: 2.8 g/dL (calc) (ref 1.9–3.7)
Glucose, Bld: 132 mg/dL — ABNORMAL HIGH (ref 65–99)
Potassium: 4.7 mmol/L (ref 3.5–5.3)
Sodium: 139 mmol/L (ref 135–146)
Total Bilirubin: 0.8 mg/dL (ref 0.2–1.2)
Total Protein: 7.1 g/dL (ref 6.1–8.1)

## 2018-10-23 LAB — CBC WITH DIFFERENTIAL/PLATELET
Absolute Monocytes: 819 cells/uL (ref 200–950)
Basophils Absolute: 90 cells/uL (ref 0–200)
Basophils Relative: 1 %
Eosinophils Absolute: 360 cells/uL (ref 15–500)
Eosinophils Relative: 4 %
HCT: 42.8 % (ref 38.5–50.0)
Hemoglobin: 14.6 g/dL (ref 13.2–17.1)
Lymphs Abs: 3969 cells/uL — ABNORMAL HIGH (ref 850–3900)
MCH: 34.2 pg — ABNORMAL HIGH (ref 27.0–33.0)
MCHC: 34.1 g/dL (ref 32.0–36.0)
MCV: 100.2 fL — ABNORMAL HIGH (ref 80.0–100.0)
MPV: 10.2 fL (ref 7.5–12.5)
Monocytes Relative: 9.1 %
Neutro Abs: 3762 cells/uL (ref 1500–7800)
Neutrophils Relative %: 41.8 %
Platelets: 198 10*3/uL (ref 140–400)
RBC: 4.27 10*6/uL (ref 4.20–5.80)
RDW: 11.7 % (ref 11.0–15.0)
Total Lymphocyte: 44.1 %
WBC: 9 10*3/uL (ref 3.8–10.8)

## 2018-10-23 LAB — LIPID PANEL
Cholesterol: 177 mg/dL (ref <200)
HDL: 49 mg/dL (ref >=40)
LDL Cholesterol (Calc): 105 mg/dL (calc) — ABNORMAL HIGH
Non-HDL Cholesterol (Calc): 128 mg/dL (calc) (ref <130)
Total CHOL/HDL Ratio: 3.6 (calc) (ref <5.0)
Triglycerides: 136 mg/dL (ref <150)

## 2018-10-23 LAB — HEMOGLOBIN A1C
Hgb A1c MFr Bld: 5.2 % of total Hgb (ref <5.7)
Mean Plasma Glucose: 103 (calc)
eAG (mmol/L): 5.7 (calc)

## 2018-10-28 ENCOUNTER — Encounter: Payer: Self-pay | Admitting: Family Medicine

## 2018-10-28 DIAGNOSIS — I10 Essential (primary) hypertension: Secondary | ICD-10-CM

## 2018-10-29 MED ORDER — AMLODIPINE BESYLATE 10 MG PO TABS: 10.0000 mg | ORAL_TABLET | Freq: Every day | ORAL | 1 refills | Status: DC

## 2018-11-19 ENCOUNTER — Other Ambulatory Visit: Payer: Self-pay | Admitting: Family Medicine

## 2018-12-02 ENCOUNTER — Encounter: Payer: Self-pay | Admitting: Family Medicine

## 2018-12-02 ENCOUNTER — Ambulatory Visit (INDEPENDENT_AMBULATORY_CARE_PROVIDER_SITE_OTHER): Payer: Medicare Other | Admitting: Family Medicine

## 2018-12-02 ENCOUNTER — Other Ambulatory Visit: Payer: Self-pay

## 2018-12-02 VITALS — BP 136/64 | HR 58 | Temp 97.6°F | Resp 12 | Ht 64.0 in | Wt 158.0 lb

## 2018-12-02 DIAGNOSIS — K760 Fatty (change of) liver, not elsewhere classified: Secondary | ICD-10-CM | POA: Diagnosis not present

## 2018-12-02 DIAGNOSIS — N183 Chronic kidney disease, stage 3 unspecified: Secondary | ICD-10-CM | POA: Diagnosis not present

## 2018-12-02 DIAGNOSIS — Z125 Encounter for screening for malignant neoplasm of prostate: Secondary | ICD-10-CM

## 2018-12-02 DIAGNOSIS — I10 Essential (primary) hypertension: Secondary | ICD-10-CM | POA: Diagnosis not present

## 2018-12-02 DIAGNOSIS — Z0000 Encounter for general adult medical examination without abnormal findings: Secondary | ICD-10-CM

## 2018-12-02 DIAGNOSIS — E119 Type 2 diabetes mellitus without complications: Secondary | ICD-10-CM

## 2018-12-02 DIAGNOSIS — Z0001 Encounter for general adult medical examination with abnormal findings: Secondary | ICD-10-CM | POA: Diagnosis not present

## 2018-12-02 MED ORDER — SILDENAFIL CITRATE 100 MG PO TABS: 100.0000 mg | ORAL_TABLET | Freq: Every day | ORAL | 3 refills | Status: DC | PRN

## 2018-12-02 MED ORDER — HYDROCHLOROTHIAZIDE 12.5 MG PO CAPS: ORAL_CAPSULE | ORAL | 3 refills | Status: DC

## 2018-12-02 NOTE — Progress Notes (Signed)
Subjective:    Patient ID: Bradley Baldwin, male    DOB: 1950/09/09, 68 y.o.   MRN: [id number]  HPI Patient is here today for complete physical exam. His colonoscopy was performed in 01/2017 and was significant for 2 tubular adenomas. He is scheduled repeat colonoscopy in 2024.  Patient was screened for hepatitis C prior to joining epic.  Diabetic foot exam is performed today and is normal.  Immunizations are up-to-date as listed below: Immunization History  Administered Date(s) Administered  . Fluad Quad(high Dose 65+) 10/22/2018  . Influenza, High Dose Seasonal PF 11/27/2016  . Influenza,inj,Quad PF,6+ Mos 01/27/2013, 11/12/2013, 12/01/2014, 11/18/2015, 10/19/2017  . Pneumococcal Conjugate-13 06/02/2016  . Pneumococcal Polysaccharide-23 05/29/2014  . Tdap 11/16/2009, 02/28/2013   Patient had lab work in September.  At that time, hemoglobin A1c was excellent at 5.2.  LDL cholesterol was slightly elevated at 105.  However renal function had declined and creatinine was over 1.3.  It was less than 1 3 years ago.  Patient has not been taking any NSAIDs.  He is on lisinopril and hydrochlorothiazide.  He is also on Metformin.  His liver function tests are also slightly elevated.  Since the lab work in September, the patient has been working aggressively on diet exercise and weight loss.  Would like to recheck his renal function and his liver function test.  He is also due for a PSA to screen for prostate cancer.  He denies any issues with falls, depression, or memory loss. Past Medical History:  Diagnosis Date  . Arthritis    "starting to get achy joints here and there" (11/28/2011)  . Colon polyps   . External bleeding hemorrhoids    "occasionally" (11/28/2011)  . Fatty liver disease, nonalcoholic   . GERD (gastroesophageal reflux disease)   . Hypertension    pcp   dr pickerd   [place]  . Prediabetes    Past Surgical History:  Procedure Laterality Date  . 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 TOTAL HIP ARTHROPLASTY  11/28/2011   Procedure: TOTAL HIP ARTHROPLASTY ANTERIOR APPROACH;  Surgeon: Hessie Dibble, MD;  Location: [facility] [facility];  Service: Orthopedics;  Laterality: Right;   Current Outpatient Medications on File Prior to Visit  Medication Sig Dispense Refill  . albuterol (PROVENTIL HFA;VENTOLIN HFA) 108 (90 Base) MCG/ACT inhaler Inhale 2 puffs into the lungs every 4 (four) hours as needed for wheezing or shortness of breath. 1 Inhaler 0  . amLODipine (NORVASC) 10 MG tablet Take 1 tablet (10 mg total) by mouth daily. 90 tablet 1  . aspirin EC 81 MG tablet Take 81 mg by mouth daily.    Marland Kitchen atenolol (TENORMIN) 50 MG tablet Take 1 tablet (50 mg total) by mouth daily. 90 tablet 1  . Cetirizine HCl (ZYRTEC ALLERGY) 10 MG TBDP Take 10 mg by mouth at bedtime. 30 tablet 0  . fluticasone (FLONASE) 50 MCG/ACT nasal spray Place 2 sprays into the nose daily. (Patient taking differently: Place 2 sprays into the nose daily. PRN) 16 g 6  . glucose blood test strip Check bs qd E11.9 100 each 12  . Lancets (ONETOUCH ULTRASOFT) lancets Use as instructed 100 each 12  . lisinopril (ZESTRIL) 40 MG tablet Take 1 tablet (40 mg total) by mouth daily. 90 tablet 1  . metFORMIN (GLUCOPHAGE) 1000 MG tablet Take 1 tablet (1,000 mg total) by mouth 2 (two) times daily with a meal. (Patient taking differently: Take 1,000 mg by mouth  daily. ) 180 tablet 1  . mometasone (NASONEX) 50 MCG/ACT nasal spray Place 2 sprays into the nose daily. 17 g 1   Current Facility-Administered Medications on File Prior to Visit  Medication Dose Route Frequency Provider Last Rate Last Dose  . ipratropium-albuterol (DUONEB) 0.5-2.5 (3) MG/3ML nebulizer solution 3 mL  3 mL Nebulization Once Delsa Grana, PA-C       No Known Allergies Social History   Socioeconomic History  . Marital status: Married    Spouse name: Not on file  . Number of children: Not on file  . Years of education: Not on file  . Highest  education level: Not on file  Occupational History  . Not on file  Social Needs  . Financial resource strain: Not on file  . Food insecurity    Worry: Not on file    Inability: Not on file  . Transportation needs    Medical: Not on file    Non-medical: Not on file  Tobacco Use  . Smoking status: Former Smoker    Packs/day: 2.00    Years: 10.00    Pack years: 20.00    Types: Cigarettes  . Smokeless tobacco: Former Systems developer    Types: Chew  . Tobacco comment: 11/28/2011 "quit smoking ~ 30 yr ago; chewed some then quit that"  Substance and Sexual Activity  . Alcohol use: Yes    Alcohol/week: 6.0 standard drinks    Types: 6 Cans of beer per week    Comment: 11/28/2011 "drink a few beers 3 times/wk; ~ 6pk total/wk"  . Drug use: No  . Sexual activity: Yes  Lifestyle  . Physical activity    Days per week: Not on file    Minutes per session: Not on file  . Stress: Not on file  Relationships  . Social Herbalist on phone: Not on file    Gets together: Not on file    Attends religious service: Not on file    Active member of club or organization: Not on file    Attends meetings of clubs or organizations: Not on file    Relationship status: Not on file  . Intimate partner violence    Fear of current or ex partner: Not on file    Emotionally abused: Not on file    Physically abused: Not on file    Forced sexual activity: Not on file  Other Topics Concern  . Not on file  Social History Narrative  . Not on file   No family history on file.' No visits with results within 1 Month(s) from this visit.  Latest known visit with results is:  Appointment on 10/22/2018  Component Date Value Ref Range Status  . Hgb A1c MFr Bld 10/22/2018 5.2  <5.7 % of total Hgb Final   Comment: For the purpose of screening for the presence of diabetes: . <5.7%       Consistent with the absence of diabetes 5.7-6.4%    Consistent with increased risk for diabetes             (prediabetes) >  or =6.5%  Consistent with diabetes . This assay result is consistent with a decreased risk of diabetes. . Currently, no consensus exists regarding use of hemoglobin A1c for diagnosis of diabetes in children. . According to American Diabetes Association (ADA) guidelines, hemoglobin A1c <7.0% represents optimal control in non-pregnant diabetic patients. Different metrics may apply to specific patient populations.  Standards of Medical Care in  Diabetes(ADA). .   . Mean Plasma Glucose 10/22/2018 103  (calc) Final  . eAG (mmol/L) 10/22/2018 5.7  (calc) Final  . WBC 10/22/2018 9.0  3.8 - 10.8 Thousand/uL Final  . RBC 10/22/2018 4.27  4.20 - 5.80 Million/uL Final  . Hemoglobin 10/22/2018 14.6  13.2 - 17.1 g/dL Final  . HCT 10/22/2018 42.8  38.5 - 50.0 % Final  . MCV 10/22/2018 100.2* 80.0 - 100.0 fL Final  . MCH 10/22/2018 34.2* 27.0 - 33.0 pg Final  . MCHC 10/22/2018 34.1  32.0 - 36.0 g/dL Final  . RDW 10/22/2018 11.7  11.0 - 15.0 % Final  . Platelets 10/22/2018 198  140 - 400 Thousand/uL Final  . MPV 10/22/2018 10.2  7.5 - 12.5 fL Final  . Neutro Abs 10/22/2018 3,762  1,500 - 7,800 cells/uL Final  . Lymphs Abs 10/22/2018 3,969* 850 - 3,900 cells/uL Final  . Absolute Monocytes 10/22/2018 819  200 - 950 cells/uL Final  . Eosinophils Absolute 10/22/2018 360  15 - 500 cells/uL Final  . Basophils Absolute 10/22/2018 90  0 - 200 cells/uL Final  . Neutrophils Relative % 10/22/2018 41.8  % Final  . Total Lymphocyte 10/22/2018 44.1  % Final  . Monocytes Relative 10/22/2018 9.1  % Final  . Eosinophils Relative 10/22/2018 4.0  % Final  . Basophils Relative 10/22/2018 1.0  % Final  . Glucose, Bld 10/22/2018 132* 65 - 99 mg/dL Final   Comment: .            Fasting reference interval . For someone without known diabetes, a glucose value >125 mg/dL indicates that they may have diabetes and this should be confirmed with a follow-up test. .   . BUN 10/22/2018 25  7 - 25 mg/dL Final  . Creat  10/22/2018 1.35* 0.70 - 1.25 mg/dL Final   Comment: For patients >8 years of age, the reference limit for Creatinine is approximately 13% higher for people identified as African-American. .   Havery Moros Ratio 10/22/2018 19  6 - 22 (calc) Final  . Sodium 10/22/2018 139  135 - 146 mmol/L Final  . Potassium 10/22/2018 4.7  3.5 - 5.3 mmol/L Final  . Chloride 10/22/2018 103  98 - 110 mmol/L Final  . CO2 10/22/2018 27  20 - 32 mmol/L Final  . Calcium 10/22/2018 9.9  8.6 - 10.3 mg/dL Final  . Total Protein 10/22/2018 7.1  6.1 - 8.1 g/dL Final  . Albumin 10/22/2018 4.3  3.6 - 5.1 g/dL Final  . Globulin 10/22/2018 2.8  1.9 - 3.7 g/dL (calc) Final  . AG Ratio 10/22/2018 1.5  1.0 - 2.5 (calc) Final  . Total Bilirubin 10/22/2018 0.8  0.2 - 1.2 mg/dL Final  . Alkaline phosphatase (APISO) 10/22/2018 60  35 - 144 U/L Final  . AST 10/22/2018 67* 10 - 35 U/L Final  . ALT 10/22/2018 85* 9 - 46 U/L Final  . Cholesterol 10/22/2018 177  <200 mg/dL Final  . HDL 10/22/2018 49  > OR = 40 mg/dL Final  . Triglycerides 10/22/2018 136  <150 mg/dL Final  . LDL Cholesterol (Calc) 10/22/2018 105* mg/dL (calc) Final   Comment: Reference range: <100 . Desirable range <100 mg/dL for primary prevention;   <70 mg/dL for patients with CHD or diabetic patients  with > or = 2 CHD risk factors. Marland Kitchen LDL-C is now calculated using the Martin-Hopkins  calculation, which is a validated novel method providing  better accuracy than the Friedewald equation in the  estimation  of LDL-C.  Cresenciano Genre et al. Annamaria Helling. [id number]): 2061-2068  (http://education.[facility].com/faq/FAQ164)   . Total CHOL/HDL Ratio 10/22/2018 3.6  <5.0 (calc) Final  . Non-HDL Cholesterol (Calc) 10/22/2018 128  <130 mg/dL (calc) Final   Comment: For patients with diabetes plus 1 major ASCVD risk  factor, treating to a non-HDL-C goal of <100 mg/dL  (LDL-C of <70 mg/dL) is considered a therapeutic  option.      Review of Systems  All  other systems reviewed and are negative.      Objective:   Physical Exam  Constitutional: He is oriented to person, place, and time. He appears well-developed and well-nourished. No distress.  HENT:  Head: Normocephalic and atraumatic.  Right Ear: External ear normal.  Left Ear: External ear normal.  Nose: Nose normal.  Mouth/Throat: Oropharynx is clear and moist. No oropharyngeal exudate.  Eyes: Pupils are equal, round, and reactive to light. Conjunctivae and EOM are normal. Right eye exhibits no discharge. Left eye exhibits no discharge. No scleral icterus.  Neck: Normal range of motion. Neck supple. No JVD present. No tracheal deviation present. No thyromegaly present.  Cardiovascular: Normal rate, regular rhythm, normal heart sounds and intact distal pulses. Exam reveals no gallop and no friction rub.  No murmur heard. Pulmonary/Chest: Effort normal and breath sounds normal. No stridor. No respiratory distress. He has no wheezes. He has no rales. He exhibits no tenderness.  Abdominal: Soft. Bowel sounds are normal. He exhibits no distension and no mass. There is no abdominal tenderness. There is no rebound and no guarding.  Genitourinary:    Prostate and rectum normal.   Musculoskeletal: Normal range of motion.        General: No tenderness or edema.  Lymphadenopathy:    He has no cervical adenopathy.  Neurological: He is alert and oriented to person, place, and time. He has normal reflexes. No cranial nerve deficit. He exhibits normal muscle tone. Coordination normal.  Skin: Skin is warm. No rash noted. He is not diaphoretic. No erythema. No pallor.  Psychiatric: He has a normal mood and affect. His behavior is normal. Judgment and thought content normal.  Vitals reviewed.         Assessment & Plan:  Prostate cancer screening - Plan: PSA  Stage 3 chronic kidney disease, unspecified whether stage 3a or 3b CKD - Plan: COMPLETE METABOLIC PANEL WITH GFR  Routine general  medical examination at a health care facility  Controlled type 2 diabetes mellitus without complication, without long-term current use of insulin (HCC)  Benign essential HTN  Fatty liver disease, nonalcoholic  Patient's blood pressure today is acceptable.  His hemoglobin A1c is outstanding.  Given the rise in his creatinine, I have recommended discontinuation of Metformin.  In 3 months I would repeat a hemoglobin A1c, CMP, and a fasting lipid panel.  He is due for PSA to screen for prostate cancer.  Colonoscopy is up-to-date.  I will recheck his renal function and if worsening, I would recommend discontinuation of hydrochlorothiazide and obtaining a renal ultrasound.  Patient's immunizations are up-to-date except for the shingles vaccine.  We discussed this today.  He will check on the price.  Colonoscopy is up-to-date.  He denies any issues with falls, depression, or memory loss.

## 2018-12-03 ENCOUNTER — Encounter: Payer: Self-pay | Admitting: Family Medicine

## 2018-12-03 LAB — COMPLETE METABOLIC PANEL WITH GFR
AG Ratio: 1.5 (calc) (ref 1.0–2.5)
ALT: 48 U/L — ABNORMAL HIGH (ref 9–46)
AST: 39 U/L — ABNORMAL HIGH (ref 10–35)
Albumin: 4.5 g/dL (ref 3.6–5.1)
Alkaline phosphatase (APISO): 83 U/L (ref 35–144)
BUN/Creatinine Ratio: 19 (calc) (ref 6–22)
BUN: 28 mg/dL — ABNORMAL HIGH (ref 7–25)
CO2: 24 mmol/L (ref 20–32)
Calcium: 10 mg/dL (ref 8.6–10.3)
Chloride: 100 mmol/L (ref 98–110)
Creat: 1.45 mg/dL — ABNORMAL HIGH (ref 0.70–1.25)
GFR, Est African American: 57 mL/min/{1.73_m2} — ABNORMAL LOW (ref >=60)
GFR, Est Non African American: 49 mL/min/{1.73_m2} — ABNORMAL LOW (ref >=60)
Globulin: 3.1 g/dL (calc) (ref 1.9–3.7)
Glucose, Bld: 131 mg/dL — ABNORMAL HIGH (ref 65–99)
Potassium: 4.5 mmol/L (ref 3.5–5.3)
Sodium: 137 mmol/L (ref 135–146)
Total Bilirubin: 0.8 mg/dL (ref 0.2–1.2)
Total Protein: 7.6 g/dL (ref 6.1–8.1)

## 2018-12-03 LAB — PSA: PSA: 0.3 ng/mL (ref <=4.0)

## 2018-12-04 ENCOUNTER — Other Ambulatory Visit: Payer: Self-pay | Admitting: Family Medicine

## 2018-12-04 DIAGNOSIS — R944 Abnormal results of kidney function studies: Secondary | ICD-10-CM

## 2018-12-05 ENCOUNTER — Other Ambulatory Visit: Payer: Self-pay

## 2018-12-05 ENCOUNTER — Other Ambulatory Visit: Payer: Medicare Other

## 2018-12-05 DIAGNOSIS — R944 Abnormal results of kidney function studies: Secondary | ICD-10-CM

## 2018-12-05 LAB — URINALYSIS, ROUTINE W REFLEX MICROSCOPIC
Bacteria, UA: NONE SEEN /HPF
Bilirubin Urine: NEGATIVE
Glucose, UA: NEGATIVE
Hyaline Cast: NONE SEEN /LPF
Ketones, ur: NEGATIVE
Leukocytes,Ua: NEGATIVE
Nitrite: NEGATIVE
Specific Gravity, Urine: 1.015 (ref 1.001–1.03)
Squamous Epithelial / HPF: NONE SEEN /HPF (ref <=5)
WBC, UA: NONE SEEN /HPF (ref 0–5)
pH: 6 (ref 5.0–8.0)

## 2018-12-05 LAB — MICROSCOPIC MESSAGE

## 2018-12-10 ENCOUNTER — Ambulatory Visit
Admission: RE | Admit: 2018-12-10 | Discharge: 2018-12-10 | Disposition: A | Discharge status: Home / Self Care | Payer: PRIVATE HEALTH INSURANCE | Source: Ambulatory Visit | Attending: Family Medicine | Admitting: Family Medicine

## 2018-12-10 DIAGNOSIS — N281 Cyst of kidney, acquired: Secondary | ICD-10-CM | POA: Diagnosis not present

## 2018-12-10 DIAGNOSIS — R944 Abnormal results of kidney function studies: Secondary | ICD-10-CM

## 2018-12-11 ENCOUNTER — Other Ambulatory Visit: Payer: Self-pay | Admitting: Family Medicine

## 2018-12-11 DIAGNOSIS — R944 Abnormal results of kidney function studies: Secondary | ICD-10-CM

## 2018-12-11 DIAGNOSIS — N183 Chronic kidney disease, stage 3 unspecified: Secondary | ICD-10-CM

## 2018-12-11 DIAGNOSIS — I10 Essential (primary) hypertension: Secondary | ICD-10-CM

## 2018-12-13 ENCOUNTER — Other Ambulatory Visit: Payer: Medicare Other

## 2018-12-13 ENCOUNTER — Other Ambulatory Visit: Payer: Self-pay

## 2018-12-13 DIAGNOSIS — R944 Abnormal results of kidney function studies: Secondary | ICD-10-CM

## 2018-12-14 LAB — PROTEIN, URINE, 24 HOUR: Protein, 24H Urine: 750 mg/24 h — ABNORMAL HIGH (ref 0–149)

## 2018-12-16 ENCOUNTER — Encounter: Payer: Self-pay | Admitting: Family Medicine

## 2018-12-16 DIAGNOSIS — N183 Chronic kidney disease, stage 3 unspecified: Secondary | ICD-10-CM | POA: Insufficient documentation

## 2019-03-12 ENCOUNTER — Ambulatory Visit: Payer: PRIVATE HEALTH INSURANCE

## 2019-03-31 ENCOUNTER — Ambulatory Visit (INDEPENDENT_AMBULATORY_CARE_PROVIDER_SITE_OTHER): Payer: Medicare Other | Admitting: Family Medicine

## 2019-03-31 ENCOUNTER — Encounter: Payer: Self-pay | Admitting: Family Medicine

## 2019-03-31 ENCOUNTER — Other Ambulatory Visit: Payer: Self-pay

## 2019-03-31 VITALS — BP 148/76 | HR 58 | Temp 97.2°F | Resp 14 | Ht 64.0 in | Wt 174.0 lb

## 2019-03-31 DIAGNOSIS — E118 Type 2 diabetes mellitus with unspecified complications: Secondary | ICD-10-CM | POA: Diagnosis not present

## 2019-03-31 DIAGNOSIS — I10 Essential (primary) hypertension: Secondary | ICD-10-CM

## 2019-03-31 DIAGNOSIS — N183 Chronic kidney disease, stage 3 unspecified: Secondary | ICD-10-CM

## 2019-03-31 DIAGNOSIS — K760 Fatty (change of) liver, not elsewhere classified: Secondary | ICD-10-CM | POA: Diagnosis not present

## 2019-03-31 MED ORDER — TRIAMCINOLONE ACETONIDE 0.1 % EX CREA: 1.0000 "application " | TOPICAL_CREAM | Freq: Two times a day (BID) | CUTANEOUS | 0 refills | Status: AC

## 2019-03-31 MED ORDER — HYDROXYZINE HCL 25 MG PO TABS: 25.0000 mg | ORAL_TABLET | Freq: Every evening | ORAL | 1 refills | Status: DC | PRN

## 2019-03-31 NOTE — Progress Notes (Signed)
Subjective:    Patient ID: Bradley Baldwin, male    DOB: November 01, 1950, 69 y.o.   MRN: [id number]  HPI Patient is here today for follow-up of his chronic medical problems.  He has a history of hypertension.  He is currently taking combination of amlodipine, atenolol, and lisinopril.  He takes all of his medication in the morning.  He states his blood pressure when he first wakes up is around [id number] systolic.  His diastolic blood pressure is less than 70.  Later in the day after he is taking his medication, his blood pressure is 130/70.  He denies any chest pain shortness of breath or dyspnea on exertion.  He is due for fasting lab work to monitor his diabetes.  He denies any polyuria, polydipsia, or blurry vision.  He denies any numbness or tingling in his feet.  He also has a history of fatty liver disease.  He has gained some weight over the holidays and would like to recheck his liver function test.  He is not quite as bad as active and is concerned with his sedentary lifestyle and dietary indiscretion over the holidays may have inflamed his liver.  He is also due to check his cholesterol.  He also has a rash on his right gluteus.  The rash consist of 4 isolated patches of erythematous skin with fine papules and lichenification similar to an eczema-like rash.  He denies any itching or burning.  The rash has been there for several months.  It does not appear to be growing or changing. Past Medical History:  Diagnosis Date  . Arthritis    "starting to get achy joints here and there" (11/28/2011)  . CKD (chronic kidney disease), stage III    proteinuria 750 mg a day  . Colon polyps   . External bleeding hemorrhoids    "occasionally" (11/28/2011)  . Fatty liver disease, nonalcoholic   . GERD (gastroesophageal reflux disease)   . Hypertension    pcp   dr pickerd   [place]  . Prediabetes    Past Surgical History:  Procedure Laterality Date  . 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 TOTAL  HIP ARTHROPLASTY  11/28/2011   Procedure: TOTAL HIP ARTHROPLASTY ANTERIOR APPROACH;  Surgeon: Hessie Dibble, MD;  Location: [facility];  Service: Orthopedics;  Laterality: Right;   Current Outpatient Medications on File Prior to Visit  Medication Sig Dispense Refill  . amLODipine (NORVASC) 10 MG tablet Take 1 tablet (10 mg total) by mouth daily. 90 tablet 1  . aspirin EC 81 MG tablet Take 81 mg by mouth daily.    Marland Kitchen atenolol (TENORMIN) 50 MG tablet TAKE 1 TABLET(50 MG) BY MOUTH DAILY 90 tablet 1  . Cetirizine HCl (ZYRTEC ALLERGY) 10 MG TBDP Take 10 mg by mouth at bedtime. 30 tablet 0  . fluticasone (FLONASE) 50 MCG/ACT nasal spray Place 2 sprays into the nose daily. (Patient taking differently: Place 2 sprays into the nose daily. PRN) 16 g 6  . glucose blood test strip Check bs qd E11.9 100 each 12  . Lancets (ONETOUCH ULTRASOFT) lancets Use as instructed 100 each 12  . lisinopril (ZESTRIL) 40 MG tablet TAKE 1 TABLET(40 MG) BY MOUTH DAILY 90 tablet 1  . mometasone (NASONEX) 50 MCG/ACT nasal spray Place 2 sprays into the nose daily. 17 g 1  . sildenafil (VIAGRA) 100 MG tablet Take 1 tablet (100 mg total) by mouth daily as needed for erectile dysfunction. 30 tablet  3  . metFORMIN (GLUCOPHAGE) 1000 MG tablet Take 1 tablet (1,000 mg total) by mouth 2 (two) times daily with a meal. (Patient not taking: Reported on 03/31/2019) 180 tablet 1   Current Facility-Administered Medications on File Prior to Visit  Medication Dose Route Frequency Provider Last Rate Last Admin  . ipratropium-albuterol (DUONEB) 0.5-2.5 (3) MG/3ML nebulizer solution 3 mL  3 mL Nebulization Once Delsa Grana, PA-C       No Known Allergies Social History   Socioeconomic History  . Marital status: Married    Spouse name: Not on file  . Number of children: Not on file  . Years of education: Not on file  . Highest education level: Not on file  Occupational History  . Not on file  Tobacco Use  . Smoking status: Former Smoker     Packs/day: 2.00    Years: 10.00    Pack years: 20.00    Types: Cigarettes  . Smokeless tobacco: Former Systems developer    Types: Chew  . Tobacco comment: 11/28/2011 "quit smoking ~ 30 yr ago; chewed some then quit that"  Substance and Sexual Activity  . Alcohol use: Yes    Alcohol/week: 6.0 standard drinks    Types: 6 Cans of beer per week    Comment: 11/28/2011 "drink a few beers 3 times/wk; ~ 6pk total/wk"  . Drug use: No  . Sexual activity: Yes  Other Topics Concern  . Not on file  Social History Narrative  . Not on file   Social Determinants of Health   Financial Resource Strain:   . Difficulty of Paying Living Expenses: Not on file  Food Insecurity:   . Worried About Charity fundraiser in the Last Year: Not on file  . Ran Out of Food in the Last Year: Not on file  Transportation Needs:   . Lack of Transportation (Medical): Not on file  . Lack of Transportation (Non-Medical): Not on file  Physical Activity:   . Days of Exercise per Week: Not on file  . Minutes of Exercise per Session: Not on file  Stress:   . Feeling of Stress : Not on file  Social Connections:   . Frequency of Communication with Friends and Family: Not on file  . Frequency of Social Gatherings with Friends and Family: Not on file  . Attends Religious Services: Not on file  . Active Member of Clubs or Organizations: Not on file  . Attends Archivist Meetings: Not on file  . Marital Status: Not on file  Intimate Partner Violence:   . Fear of Current or Ex-Partner: Not on file  . Emotionally Abused: Not on file  . Physically Abused: Not on file  . Sexually Abused: Not on file     Review of Systems  All other systems reviewed and are negative.      Objective:   Physical Exam  Constitutional: He appears well-developed and well-nourished. No distress.  Cardiovascular: Normal rate, regular rhythm and normal heart sounds.  Pulmonary/Chest: Effort normal and breath sounds normal.  Abdominal:  Soft. Bowel sounds are normal.  Skin: Rash noted. Rash is maculopapular. He is not diaphoretic.     Vitals reviewed.         Assessment & Plan:  Controlled type 2 diabetes mellitus with complication, without long-term current use of insulin (HCC) - Plan: Hemoglobin A1c, CBC with Differential/Platelet, COMPLETE METABOLIC PANEL WITH GFR, Lipid panel, Microalbumin, urine  Benign essential HTN  Stage 3 chronic kidney disease,  unspecified whether stage 3a or 3b CKD  Fatty liver disease, nonalcoholic  Blood pressure is too high.  I recommended that he split up his antihypertensive medication and take atenolol at night.  Check his blood pressure twice a day and report the values to me in 2 weeks.  If still elevated I would recommend adding back hydrochlorothiazide.  Monitor his kidney function by checking a CMP this afternoon.  Monitor his liver function test pertaining to his fatty liver disease.  Check a hemoglobin A1c.  Goal hemoglobin A1c is less than 6.5.  If the patient's LDL cholesterol is greater than 100, I would recommend starting a statin for hyperlipidemia given his diabetes.  I believe the rash is most likely atopic dermatitis.  We will try triamcinolone cream twice a day for 10 to 14 days if persistent I would recommend a biopsy.

## 2019-04-01 LAB — MICROALBUMIN, URINE: Microalb, Ur: 124 mg/dL

## 2019-04-01 LAB — CBC WITH DIFFERENTIAL/PLATELET
Absolute Monocytes: 783 cells/uL (ref 200–950)
Basophils Absolute: 89 cells/uL (ref 0–200)
Basophils Relative: 1 %
Eosinophils Absolute: 258 cells/uL (ref 15–500)
Eosinophils Relative: 2.9 %
HCT: 43.3 % (ref 38.5–50.0)
Hemoglobin: 15.5 g/dL (ref 13.2–17.1)
Lymphs Abs: 4032 cells/uL — ABNORMAL HIGH (ref 850–3900)
MCH: 34.7 pg — ABNORMAL HIGH (ref 27.0–33.0)
MCHC: 35.8 g/dL (ref 32.0–36.0)
MCV: 96.9 fL (ref 80.0–100.0)
MPV: 10.4 fL (ref 7.5–12.5)
Monocytes Relative: 8.8 %
Neutro Abs: 3738 cells/uL (ref 1500–7800)
Neutrophils Relative %: 42 %
Platelets: 215 10*3/uL (ref 140–400)
RBC: 4.47 10*6/uL (ref 4.20–5.80)
RDW: 11.7 % (ref 11.0–15.0)
Total Lymphocyte: 45.3 %
WBC: 8.9 10*3/uL (ref 3.8–10.8)

## 2019-04-01 LAB — LIPID PANEL
Cholesterol: 242 mg/dL — ABNORMAL HIGH (ref <200)
HDL: 46 mg/dL (ref >=40)
LDL Cholesterol (Calc): 152 mg/dL (calc) — ABNORMAL HIGH
Non-HDL Cholesterol (Calc): 196 mg/dL (calc) — ABNORMAL HIGH (ref <130)
Total CHOL/HDL Ratio: 5.3 (calc) — ABNORMAL HIGH (ref <5.0)
Triglycerides: 268 mg/dL — ABNORMAL HIGH (ref <150)

## 2019-04-01 LAB — COMPLETE METABOLIC PANEL WITH GFR
AG Ratio: 1.5 (calc) (ref 1.0–2.5)
ALT: 33 U/L (ref 9–46)
AST: 34 U/L (ref 10–35)
Albumin: 4.6 g/dL (ref 3.6–5.1)
Alkaline phosphatase (APISO): 67 U/L (ref 35–144)
BUN/Creatinine Ratio: 17 (calc) (ref 6–22)
BUN: 24 mg/dL (ref 7–25)
CO2: 27 mmol/L (ref 20–32)
Calcium: 10.1 mg/dL (ref 8.6–10.3)
Chloride: 101 mmol/L (ref 98–110)
Creat: 1.38 mg/dL — ABNORMAL HIGH (ref 0.70–1.25)
GFR, Est African American: 60 mL/min/{1.73_m2} (ref >=60)
GFR, Est Non African American: 52 mL/min/{1.73_m2} — ABNORMAL LOW (ref >=60)
Globulin: 3 g/dL (calc) (ref 1.9–3.7)
Glucose, Bld: 132 mg/dL — ABNORMAL HIGH (ref 65–99)
Potassium: 4.9 mmol/L (ref 3.5–5.3)
Sodium: 137 mmol/L (ref 135–146)
Total Bilirubin: 0.8 mg/dL (ref 0.2–1.2)
Total Protein: 7.6 g/dL (ref 6.1–8.1)

## 2019-04-01 LAB — HEMOGLOBIN A1C
Hgb A1c MFr Bld: 6.6 % of total Hgb — ABNORMAL HIGH (ref <5.7)
Mean Plasma Glucose: 143 (calc)
eAG (mmol/L): 7.9 (calc)

## 2019-04-02 DIAGNOSIS — E119 Type 2 diabetes mellitus without complications: Secondary | ICD-10-CM | POA: Diagnosis not present

## 2019-04-02 DIAGNOSIS — H25813 Combined forms of age-related cataract, bilateral: Secondary | ICD-10-CM | POA: Diagnosis not present

## 2019-04-02 DIAGNOSIS — H5203 Hypermetropia, bilateral: Secondary | ICD-10-CM | POA: Diagnosis not present

## 2019-04-02 DIAGNOSIS — H524 Presbyopia: Secondary | ICD-10-CM | POA: Diagnosis not present

## 2019-04-02 LAB — HM DIABETES EYE EXAM

## 2019-04-03 MED ORDER — ATORVASTATIN CALCIUM 20 MG PO TABS: 20.0000 mg | ORAL_TABLET | Freq: Every day | ORAL | 3 refills | Status: DC

## 2019-04-12 ENCOUNTER — Other Ambulatory Visit: Payer: Self-pay | Admitting: Family Medicine

## 2019-04-12 DIAGNOSIS — I10 Essential (primary) hypertension: Secondary | ICD-10-CM

## 2019-04-14 ENCOUNTER — Encounter: Payer: Self-pay | Admitting: Family Medicine

## 2019-04-14 MED ORDER — HYDROCHLOROTHIAZIDE 12.5 MG PO CAPS: 12.5000 mg | ORAL_CAPSULE | Freq: Every day | ORAL | 3 refills | Status: DC

## 2019-04-24 ENCOUNTER — Encounter: Payer: Self-pay | Admitting: *Deleted

## 2019-05-12 ENCOUNTER — Other Ambulatory Visit: Payer: Self-pay

## 2019-05-12 ENCOUNTER — Ambulatory Visit: Payer: Medicare Other

## 2019-05-12 ENCOUNTER — Telehealth: Payer: Self-pay | Admitting: Family Medicine

## 2019-05-12 VITALS — BP 126/64

## 2019-05-12 DIAGNOSIS — Z0130 Encounter for examination of blood pressure without abnormal findings: Secondary | ICD-10-CM

## 2019-05-12 NOTE — Progress Notes (Signed)
Patient came in requesting a blood pressure check against his home cuff. BP manually checked was 126/64, and with his cuff his BP was 127/61. Advised patient that readings were very close.

## 2019-05-12 NOTE — Telephone Encounter (Signed)
Patient dropped off BP readings please see in your yellow folder

## 2019-05-16 ENCOUNTER — Encounter: Payer: Self-pay | Admitting: Family Medicine

## 2019-05-28 ENCOUNTER — Other Ambulatory Visit: Payer: Self-pay | Admitting: Family Medicine

## 2019-08-06 ENCOUNTER — Other Ambulatory Visit: Payer: Self-pay | Admitting: Family Medicine

## 2019-08-26 ENCOUNTER — Other Ambulatory Visit: Payer: Self-pay | Admitting: Family Medicine

## 2019-08-26 DIAGNOSIS — I10 Essential (primary) hypertension: Secondary | ICD-10-CM

## 2019-09-11 ENCOUNTER — Telehealth: Payer: Self-pay | Admitting: Family Medicine

## 2019-09-11 NOTE — Progress Notes (Signed)
  Chronic Care Management   Outreach Note  09/11/2019 Name: MINOR IDEN MRN: [id number] DOB: 08/01/1950  Referred by: Susy Frizzle, MD Reason for referral : No chief complaint on file.   An unsuccessful telephone outreach was attempted today. The patient was referred to the pharmacist for assistance with care management and care coordination.   Follow Up Plan:   Carley Perdue [employer] Scheduler

## 2019-09-17 ENCOUNTER — Telehealth: Payer: Self-pay | Admitting: Family Medicine

## 2019-09-17 NOTE — Progress Notes (Signed)
°  Chronic Care Management   Note  09/17/2019 Name: Bradley Baldwin MRN: [id number] DOB: [birth year]  Bradley Baldwin is a 69 y.o. year old male who is a primary care patient of Dennard Schaumann, Cammie Mcgee, MD. I reached out to Junius Finner by phone today in response to a referral sent by [name] PCP, Susy Frizzle, MD.   [name] was given information about Chronic Care Management services today including:  1. CCM service includes personalized support from designated clinical staff supervised by his physician, including individualized plan of care and coordination with other care providers 2. 24/7 contact phone numbers for assistance for urgent and routine care needs. 3. Service will only be billed when office clinical staff spend 20 minutes or more in a month to coordinate care. 4. Only one practitioner may furnish and bill the service in a calendar month. 5. The patient may stop CCM services at any time (effective at the end of the month) by phone call to the office staff.   Patient wishes to consider information provided and/or speak with a member of the care team before deciding about enrollment in care management services.   Follow up plan:   Carley Perdue [employer] Scheduler

## 2019-09-18 ENCOUNTER — Other Ambulatory Visit: Payer: Self-pay

## 2019-09-18 ENCOUNTER — Encounter: Payer: Self-pay | Admitting: Family Medicine

## 2019-09-18 ENCOUNTER — Ambulatory Visit (INDEPENDENT_AMBULATORY_CARE_PROVIDER_SITE_OTHER): Payer: Medicare Other | Admitting: Family Medicine

## 2019-09-18 VITALS — BP 132/68 | HR 70 | Temp 97.8°F | Resp 16 | Ht 64.0 in | Wt 175.0 lb

## 2019-09-18 DIAGNOSIS — I10 Essential (primary) hypertension: Secondary | ICD-10-CM

## 2019-09-18 DIAGNOSIS — N183 Chronic kidney disease, stage 3 unspecified: Secondary | ICD-10-CM

## 2019-09-18 DIAGNOSIS — E118 Type 2 diabetes mellitus with unspecified complications: Secondary | ICD-10-CM | POA: Diagnosis not present

## 2019-09-18 DIAGNOSIS — K760 Fatty (change of) liver, not elsewhere classified: Secondary | ICD-10-CM | POA: Diagnosis not present

## 2019-09-18 NOTE — Progress Notes (Signed)
Subjective:    Patient ID: Bradley Baldwin, male    DOB: 1950-03-09, 69 y.o.   MRN: [id number]  HPI Patient is here today for follow-up of his chronic medical problems.  Overall, the patient is doing well.  His blood pressure today is well controlled at 132/68.  He denies any chest pain shortness of breath or dyspnea on exertion.  He does have pitting edema in his extremities and is wearing compression hose.  He is on amlodipine 10 mg a day for hypertension.  He denies any orthopnea or paroxysmal nocturnal dyspnea.  He denies any polyuria, polydipsia, or blurry vision.  He denies any hypoglycemia.  He denies any neuropathy in his feet.  Diabetic foot exam was performed today and is normal.  He denies any myalgias or right upper quadrant pain.  He is consistent in taking his statin.  At present, his diabetes is being managed through diet.  Past Medical History:  Diagnosis Date  . Arthritis    "starting to get achy joints here and there" (11/28/2011)  . CKD (chronic kidney disease), stage III    proteinuria 750 mg a day  . Colon polyps   . Diabetes mellitus without complication ([place])   . External bleeding hemorrhoids    "occasionally" (11/28/2011)  . Fatty liver disease, nonalcoholic   . GERD (gastroesophageal reflux disease)   . Hypertension     Past Surgical History:  Procedure Laterality Date  . 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 TOTAL HIP ARTHROPLASTY  11/28/2011   Procedure: TOTAL HIP ARTHROPLASTY ANTERIOR APPROACH;  Surgeon: Hessie Dibble, MD;  Location: [facility];  Service: Orthopedics;  Laterality: Right;   Current Outpatient Medications on File Prior to Visit  Medication Sig Dispense Refill  . amLODipine (NORVASC) 10 MG tablet TAKE 1 TABLET(10 MG) BY MOUTH DAILY 90 tablet 1  . aspirin EC 81 MG tablet Take 81 mg by mouth daily.    Marland Kitchen atenolol (TENORMIN) 50 MG tablet TAKE 1 TABLET(50 MG) BY MOUTH DAILY 90 tablet 1  . atorvastatin (LIPITOR) 20 MG tablet Take 1 tablet  (20 mg total) by mouth daily. 90 tablet 3  . Cetirizine HCl (ZYRTEC ALLERGY) 10 MG TBDP Take 10 mg by mouth at bedtime. 30 tablet 0  . fluticasone (FLONASE) 50 MCG/ACT nasal spray Place 2 sprays into the nose daily. (Patient taking differently: Place 2 sprays into the nose daily. PRN) 16 g 6  . glucose blood test strip Check bs qd E11.9 100 each 12  . hydrochlorothiazide (MICROZIDE) 12.5 MG capsule TAKE 1 CAPSULE(12.5 MG) BY MOUTH DAILY 30 capsule 5  . hydrOXYzine (ATARAX/VISTARIL) 25 MG tablet Take 1 tablet (25 mg total) by mouth at bedtime as needed. 90 tablet 1  . Lancets (ONETOUCH ULTRASOFT) lancets Use as instructed 100 each 12  . lisinopril (ZESTRIL) 40 MG tablet TAKE 1 TABLET(40 MG) BY MOUTH DAILY 90 tablet 1  . metFORMIN (GLUCOPHAGE) 1000 MG tablet Take 1 tablet (1,000 mg total) by mouth 2 (two) times daily with a meal. (Patient not taking: Reported on 03/31/2019) 180 tablet 1  . mometasone (NASONEX) 50 MCG/ACT nasal spray Place 2 sprays into the nose daily. 17 g 1  . sildenafil (VIAGRA) 100 MG tablet Take 1 tablet (100 mg total) by mouth daily as needed for erectile dysfunction. 30 tablet 3  . triamcinolone cream (KENALOG) 0.1 % Apply 1 application topically 2 (two) times daily. 30 g 0   Current Facility-Administered Medications on File  Prior to Visit  Medication Dose Route Frequency Provider Last Rate Last Admin  . ipratropium-albuterol (DUONEB) 0.5-2.5 (3) MG/3ML nebulizer solution 3 mL  3 mL Nebulization Once Delsa Grana, PA-C       No Known Allergies Social History   Socioeconomic History  . Marital status: Married    Spouse name: Not on file  . Number of children: Not on file  . Years of education: Not on file  . Highest education level: Not on file  Occupational History  . Not on file  Tobacco Use  . Smoking status: Former Smoker    Packs/day: 2.00    Years: 10.00    Pack years: 20.00    Types: Cigarettes  . Smokeless tobacco: Former Systems developer    Types: Chew  . Tobacco  comment: 11/28/2011 "quit smoking ~ 30 yr ago; chewed some then quit that"  Substance and Sexual Activity  . Alcohol use: Yes    Alcohol/week: 6.0 standard drinks    Types: 6 Cans of beer per week    Comment: 11/28/2011 "drink a few beers 3 times/wk; ~ 6pk total/wk"  . Drug use: No  . Sexual activity: Yes  Other Topics Concern  . Not on file  Social History Narrative  . Not on file   Social Determinants of Health   Financial Resource Strain:   . Difficulty of Paying Living Expenses: Not on file  Food Insecurity:   . Worried About Charity fundraiser in the Last Year: Not on file  . Ran Out of Food in the Last Year: Not on file  Transportation Needs:   . Lack of Transportation (Medical): Not on file  . Lack of Transportation (Non-Medical): Not on file  Physical Activity:   . Days of Exercise per Week: Not on file  . Minutes of Exercise per Session: Not on file  Stress:   . Feeling of Stress : Not on file  Social Connections:   . Frequency of Communication with Friends and Family: Not on file  . Frequency of Social Gatherings with Friends and Family: Not on file  . Attends Religious Services: Not on file  . Active Member of Clubs or Organizations: Not on file  . Attends Archivist Meetings: Not on file  . Marital Status: Not on file  Intimate Partner Violence:   . Fear of Current or Ex-Partner: Not on file  . Emotionally Abused: Not on file  . Physically Abused: Not on file  . Sexually Abused: Not on file     Review of Systems  All other systems reviewed and are negative.      Objective:   Physical Exam Vitals reviewed.  Constitutional:      General: He is not in acute distress.    Appearance: Normal appearance. He is well-developed and normal weight. He is not ill-appearing, toxic-appearing or diaphoretic.  HENT:     Head: Normocephalic and atraumatic.  Eyes:     Extraocular Movements: Extraocular movements intact.     Conjunctiva/sclera:  Conjunctivae normal.     Pupils: Pupils are equal, round, and reactive to light.  Neck:     Vascular: No carotid bruit.  Cardiovascular:     Rate and Rhythm: Normal rate and regular rhythm.     Pulses: Normal pulses.     Heart sounds: Normal heart sounds. No murmur heard.  No friction rub. No gallop.   Pulmonary:     Effort: Pulmonary effort is normal. No respiratory distress.  Breath sounds: Normal breath sounds. No stridor. No wheezing, rhonchi or rales.  Chest:     Chest wall: No tenderness.  Abdominal:     General: Abdomen is flat. Bowel sounds are normal. There is no distension.     Palpations: Abdomen is soft. There is no mass.     Tenderness: There is no abdominal tenderness. There is no guarding or rebound.     Hernia: No hernia is present.  Musculoskeletal:     Right lower leg: Edema present.     Left lower leg: Edema present.  Lymphadenopathy:     Cervical: No cervical adenopathy.  Neurological:     General: No focal deficit present.     Mental Status: He is alert and oriented to person, place, and time. Mental status is at baseline.     Cranial Nerves: No cranial nerve deficit.     Motor: No weakness.     Coordination: Coordination normal.     Gait: Gait normal.           Assessment & Plan:  Controlled type 2 diabetes mellitus with complication, without long-term current use of insulin (HCC) - Plan: Hemoglobin A1c, CBC with Differential/Platelet, COMPLETE METABOLIC PANEL WITH GFR, Lipid panel, Microalbumin, urine  Benign essential HTN  Stage 3 chronic kidney disease, unspecified whether stage 3a or 3b CKD  Fatty liver disease, nonalcoholic  Check hemoglobin A1c.  Patient is asymptomatic regarding his diabetes and his diabetic foot exam is normal.  Goal A1c is less than 6.5.  Check a urine microalbumin to ensure that there is no evidence of diabetic nephropathy.  Patient does have a history of chronic kidney disease.  Monitor his creatinine.  Blood  pressures well controlled at 132/68.  Patient would like to try reducing his amlodipine to 5 mg a day to see if this will help with the edema in his legs yet still control his blood pressure.  He will call me if his blood pressure rises greater than 140/90.  Check fasting lipid panel.  Goal LDL cholesterol is less than 100.  Patient has a history of fatty liver disease.  Monitor liver function test.  Recommended Covid booster shot 8 months after second vaccine

## 2019-09-19 LAB — MICROALBUMIN, URINE: Microalb, Ur: 41.9 mg/dL

## 2019-09-19 LAB — CBC WITH DIFFERENTIAL/PLATELET
Absolute Monocytes: 864 cells/uL (ref 200–950)
Basophils Absolute: 77 cells/uL (ref 0–200)
Basophils Relative: 0.8 %
Eosinophils Absolute: 288 cells/uL (ref 15–500)
Eosinophils Relative: 3 %
HCT: 39.4 % (ref 38.5–50.0)
Hemoglobin: 13.8 g/dL (ref 13.2–17.1)
Lymphs Abs: 3907 cells/uL — ABNORMAL HIGH (ref 850–3900)
MCH: 34.1 pg — ABNORMAL HIGH (ref 27.0–33.0)
MCHC: 35 g/dL (ref 32.0–36.0)
MCV: 97.3 fL (ref 80.0–100.0)
MPV: 10.9 fL (ref 7.5–12.5)
Monocytes Relative: 9 %
Neutro Abs: 4464 cells/uL (ref 1500–7800)
Neutrophils Relative %: 46.5 %
Platelets: 226 10*3/uL (ref 140–400)
RBC: 4.05 10*6/uL — ABNORMAL LOW (ref 4.20–5.80)
RDW: 11.3 % (ref 11.0–15.0)
Total Lymphocyte: 40.7 %
WBC: 9.6 10*3/uL (ref 3.8–10.8)

## 2019-09-19 LAB — COMPLETE METABOLIC PANEL WITH GFR
AG Ratio: 2 (calc) (ref 1.0–2.5)
ALT: 59 U/L — ABNORMAL HIGH (ref 9–46)
AST: 52 U/L — ABNORMAL HIGH (ref 10–35)
Albumin: 4.9 g/dL (ref 3.6–5.1)
Alkaline phosphatase (APISO): 79 U/L (ref 35–144)
BUN/Creatinine Ratio: 21 (calc) (ref 6–22)
BUN: 35 mg/dL — ABNORMAL HIGH (ref 7–25)
CO2: 27 mmol/L (ref 20–32)
Calcium: 10.3 mg/dL (ref 8.6–10.3)
Chloride: 100 mmol/L (ref 98–110)
Creat: 1.69 mg/dL — ABNORMAL HIGH (ref 0.70–1.25)
GFR, Est African American: 47 mL/min/{1.73_m2} — ABNORMAL LOW (ref >=60)
GFR, Est Non African American: 41 mL/min/{1.73_m2} — ABNORMAL LOW (ref >=60)
Globulin: 2.5 g/dL (calc) (ref 1.9–3.7)
Glucose, Bld: 114 mg/dL — ABNORMAL HIGH (ref 65–99)
Potassium: 4.3 mmol/L (ref 3.5–5.3)
Sodium: 137 mmol/L (ref 135–146)
Total Bilirubin: 0.7 mg/dL (ref 0.2–1.2)
Total Protein: 7.4 g/dL (ref 6.1–8.1)

## 2019-09-19 LAB — HEMOGLOBIN A1C
Hgb A1c MFr Bld: 6 % of total Hgb — ABNORMAL HIGH (ref <5.7)
Mean Plasma Glucose: 126 (calc)
eAG (mmol/L): 7 (calc)

## 2019-09-19 LAB — LIPID PANEL
Cholesterol: 130 mg/dL (ref <200)
HDL: 41 mg/dL (ref >=40)
LDL Cholesterol (Calc): 61 mg/dL (calc)
Non-HDL Cholesterol (Calc): 89 mg/dL (calc) (ref <130)
Total CHOL/HDL Ratio: 3.2 (calc) (ref <5.0)
Triglycerides: 210 mg/dL — ABNORMAL HIGH (ref <150)

## 2019-09-22 ENCOUNTER — Encounter: Payer: Self-pay | Admitting: *Deleted

## 2019-11-12 ENCOUNTER — Ambulatory Visit (INDEPENDENT_AMBULATORY_CARE_PROVIDER_SITE_OTHER): Payer: Medicare Other

## 2019-11-12 ENCOUNTER — Other Ambulatory Visit: Payer: Self-pay

## 2019-11-12 DIAGNOSIS — Z23 Encounter for immunization: Secondary | ICD-10-CM

## 2019-11-20 ENCOUNTER — Other Ambulatory Visit: Payer: Self-pay | Admitting: Family Medicine

## 2019-11-24 ENCOUNTER — Other Ambulatory Visit: Payer: Self-pay | Admitting: Family Medicine

## 2019-12-11 DIAGNOSIS — Z23 Encounter for immunization: Secondary | ICD-10-CM | POA: Diagnosis not present

## 2019-12-22 DIAGNOSIS — L812 Freckles: Secondary | ICD-10-CM | POA: Diagnosis not present

## 2019-12-22 DIAGNOSIS — Z85828 Personal history of other malignant neoplasm of skin: Secondary | ICD-10-CM | POA: Diagnosis not present

## 2019-12-22 DIAGNOSIS — L570 Actinic keratosis: Secondary | ICD-10-CM | POA: Diagnosis not present

## 2019-12-22 DIAGNOSIS — L821 Other seborrheic keratosis: Secondary | ICD-10-CM | POA: Diagnosis not present

## 2020-01-14 DIAGNOSIS — M1712 Unilateral primary osteoarthritis, left knee: Secondary | ICD-10-CM | POA: Diagnosis not present

## 2020-01-14 DIAGNOSIS — M7661 Achilles tendinitis, right leg: Secondary | ICD-10-CM | POA: Diagnosis not present

## 2020-01-14 DIAGNOSIS — M25572 Pain in left ankle and joints of left foot: Secondary | ICD-10-CM | POA: Diagnosis not present

## 2020-02-03 ENCOUNTER — Other Ambulatory Visit: Payer: Self-pay | Admitting: Family Medicine

## 2020-03-03 ENCOUNTER — Other Ambulatory Visit: Payer: Self-pay | Admitting: Family Medicine

## 2020-03-03 DIAGNOSIS — I10 Essential (primary) hypertension: Secondary | ICD-10-CM

## 2020-03-19 ENCOUNTER — Other Ambulatory Visit: Payer: Self-pay | Admitting: Family Medicine

## 2020-05-21 ENCOUNTER — Other Ambulatory Visit: Payer: Self-pay | Admitting: Family Medicine

## 2020-06-01 ENCOUNTER — Other Ambulatory Visit: Payer: Self-pay | Admitting: Family Medicine

## 2020-06-03 ENCOUNTER — Other Ambulatory Visit: Payer: Self-pay | Admitting: Family Medicine

## 2020-06-08 ENCOUNTER — Other Ambulatory Visit: Payer: Self-pay | Admitting: Family Medicine

## 2020-06-16 ENCOUNTER — Other Ambulatory Visit: Payer: Medicare Other

## 2020-06-16 ENCOUNTER — Other Ambulatory Visit: Payer: Self-pay

## 2020-06-16 DIAGNOSIS — Z0000 Encounter for general adult medical examination without abnormal findings: Secondary | ICD-10-CM

## 2020-06-16 DIAGNOSIS — Z125 Encounter for screening for malignant neoplasm of prostate: Secondary | ICD-10-CM

## 2020-06-16 DIAGNOSIS — I10 Essential (primary) hypertension: Secondary | ICD-10-CM

## 2020-06-16 DIAGNOSIS — N183 Chronic kidney disease, stage 3 unspecified: Secondary | ICD-10-CM

## 2020-06-16 DIAGNOSIS — K760 Fatty (change of) liver, not elsewhere classified: Secondary | ICD-10-CM | POA: Diagnosis not present

## 2020-06-16 DIAGNOSIS — E118 Type 2 diabetes mellitus with unspecified complications: Secondary | ICD-10-CM | POA: Diagnosis not present

## 2020-06-17 ENCOUNTER — Other Ambulatory Visit: Payer: Self-pay | Admitting: *Deleted

## 2020-06-17 ENCOUNTER — Other Ambulatory Visit: Payer: Self-pay | Admitting: Family Medicine

## 2020-06-17 LAB — LIPID PANEL
Cholesterol: 107 mg/dL (ref <200)
HDL: 29 mg/dL — ABNORMAL LOW (ref >=40)
LDL Cholesterol (Calc): 49 mg/dL (calc)
Non-HDL Cholesterol (Calc): 78 mg/dL (calc) (ref <130)
Total CHOL/HDL Ratio: 3.7 (calc) (ref <5.0)
Triglycerides: 232 mg/dL — ABNORMAL HIGH (ref <150)

## 2020-06-17 LAB — CBC WITH DIFFERENTIAL/PLATELET
Absolute Monocytes: 766 cells/uL (ref 200–950)
Basophils Absolute: 70 cells/uL (ref 0–200)
Basophils Relative: 0.8 %
Eosinophils Absolute: 331 cells/uL (ref 15–500)
Eosinophils Relative: 3.8 %
HCT: 41.1 % (ref 38.5–50.0)
Hemoglobin: 13.6 g/dL (ref 13.2–17.1)
Lymphs Abs: 3654 cells/uL (ref 850–3900)
MCH: 32.5 pg (ref 27.0–33.0)
MCHC: 33.1 g/dL (ref 32.0–36.0)
MCV: 98.3 fL (ref 80.0–100.0)
MPV: 11.3 fL (ref 7.5–12.5)
Monocytes Relative: 8.8 %
Neutro Abs: 3880 cells/uL (ref 1500–7800)
Neutrophils Relative %: 44.6 %
Platelets: 192 10*3/uL (ref 140–400)
RBC: 4.18 10*6/uL — ABNORMAL LOW (ref 4.20–5.80)
RDW: 11.1 % (ref 11.0–15.0)
Total Lymphocyte: 42 %
WBC: 8.7 10*3/uL (ref 3.8–10.8)

## 2020-06-17 LAB — PSA, TOTAL AND FREE
PSA, % Free: 50 % (calc) (ref >25)
PSA, Free: 0.1 ng/mL
PSA, Total: 0.2 ng/mL (ref <=4.0)

## 2020-06-17 LAB — HEMOGLOBIN A1C
Hgb A1c MFr Bld: 12.7 % of total Hgb — ABNORMAL HIGH (ref <5.7)
Mean Plasma Glucose: 318 mg/dL
eAG (mmol/L): 17.6 mmol/L

## 2020-06-17 LAB — MICROALBUMIN / CREATININE URINE RATIO
Creatinine, Urine: 116 mg/dL (ref 20–320)
Microalb Creat Ratio: 466 mcg/mg creat — ABNORMAL HIGH (ref <30)
Microalb, Ur: 54 mg/dL

## 2020-06-17 LAB — COMPLETE METABOLIC PANEL WITH GFR
AG Ratio: 1.4 (calc) (ref 1.0–2.5)
ALT: 109 U/L — ABNORMAL HIGH (ref 9–46)
AST: 122 U/L — ABNORMAL HIGH (ref 10–35)
Albumin: 4 g/dL (ref 3.6–5.1)
Alkaline phosphatase (APISO): 122 U/L (ref 35–144)
BUN/Creatinine Ratio: 20 (calc) (ref 6–22)
BUN: 33 mg/dL — ABNORMAL HIGH (ref 7–25)
CO2: 24 mmol/L (ref 20–32)
Calcium: 9.8 mg/dL (ref 8.6–10.3)
Chloride: 94 mmol/L — ABNORMAL LOW (ref 98–110)
Creat: 1.65 mg/dL — ABNORMAL HIGH (ref 0.70–1.25)
GFR, Est African American: 48 mL/min/{1.73_m2} — ABNORMAL LOW (ref >=60)
GFR, Est Non African American: 42 mL/min/{1.73_m2} — ABNORMAL LOW (ref >=60)
Globulin: 2.8 g/dL (calc) (ref 1.9–3.7)
Glucose, Bld: 376 mg/dL — ABNORMAL HIGH (ref 65–99)
Potassium: 5 mmol/L (ref 3.5–5.3)
Sodium: 131 mmol/L — ABNORMAL LOW (ref 135–146)
Total Bilirubin: 1 mg/dL (ref 0.2–1.2)
Total Protein: 6.8 g/dL (ref 6.1–8.1)

## 2020-06-17 MED ORDER — GLIPIZIDE ER 10 MG PO TB24: 10.0000 mg | ORAL_TABLET | Freq: Every day | ORAL | 0 refills | Status: DC

## 2020-06-17 MED ORDER — METFORMIN HCL 500 MG PO TABS: 500.0000 mg | ORAL_TABLET | Freq: Two times a day (BID) | ORAL | 3 refills | Status: DC

## 2020-06-22 ENCOUNTER — Encounter: Payer: Self-pay | Admitting: Family Medicine

## 2020-06-22 ENCOUNTER — Other Ambulatory Visit: Payer: Self-pay

## 2020-06-22 ENCOUNTER — Ambulatory Visit (INDEPENDENT_AMBULATORY_CARE_PROVIDER_SITE_OTHER): Payer: Medicare Other | Admitting: Family Medicine

## 2020-06-22 VITALS — BP 122/64 | HR 78 | Temp 97.9°F | Resp 16 | Ht 64.0 in | Wt 166.0 lb

## 2020-06-22 DIAGNOSIS — I10 Essential (primary) hypertension: Secondary | ICD-10-CM | POA: Diagnosis not present

## 2020-06-22 DIAGNOSIS — N183 Chronic kidney disease, stage 3 unspecified: Secondary | ICD-10-CM | POA: Diagnosis not present

## 2020-06-22 DIAGNOSIS — K760 Fatty (change of) liver, not elsewhere classified: Secondary | ICD-10-CM

## 2020-06-22 DIAGNOSIS — Z23 Encounter for immunization: Secondary | ICD-10-CM | POA: Diagnosis not present

## 2020-06-22 DIAGNOSIS — E1165 Type 2 diabetes mellitus with hyperglycemia: Secondary | ICD-10-CM

## 2020-06-22 MED ORDER — ONETOUCH ULTRASOFT LANCETS MISC: 12 refills | Status: DC

## 2020-06-22 MED ORDER — DAPAGLIFLOZIN PROPANEDIOL 10 MG PO TABS: 10.0000 mg | ORAL_TABLET | Freq: Every day | ORAL | 11 refills | Status: DC

## 2020-06-22 MED ORDER — SILDENAFIL CITRATE 100 MG PO TABS: 100.0000 mg | ORAL_TABLET | Freq: Every day | ORAL | 3 refills | Status: DC | PRN

## 2020-06-22 MED ORDER — GLUCOSE BLOOD VI STRP: ORAL_STRIP | 12 refills | Status: DC

## 2020-06-22 NOTE — Progress Notes (Signed)
Subjective:    Patient ID: Bradley Baldwin, male    DOB: [birth year], 70 y.o.   MRN: [id number]  HPI Patient is here today for follow-up of his chronic medical problems.  Please see most recent lab work listed below.  Hemoglobin A1c has spiraled out of control.  Patient admits that he has been eating indiscriminately and not watching what he is doing.  When I got his labs back, I immediately started him on glipizide extended release 10 mg a day.  He has been checking his blood sugars over the last 3 days.  His fasting blood sugar has ranged between 140 and 170!  His 2-hour postprandial sugars are in the 1 50-1 80 range.  I am dramatically surprised that they are that good simply by taking glipizide.  He admits that he has been changing his diet substantially as well.  Lab on 06/16/2020  Component Date Value Ref Range Status  . WBC 06/16/2020 8.7  3.8 - 10.8 Thousand/uL Final  . RBC 06/16/2020 4.18* 4.20 - 5.80 Million/uL Final  . Hemoglobin 06/16/2020 13.6  13.2 - 17.1 g/dL Final  . HCT 06/16/2020 41.1  38.5 - 50.0 % Final  . MCV 06/16/2020 98.3  80.0 - 100.0 fL Final  . MCH 06/16/2020 32.5  27.0 - 33.0 pg Final  . MCHC 06/16/2020 33.1  32.0 - 36.0 g/dL Final  . RDW 06/16/2020 11.1  11.0 - 15.0 % Final  . Platelets 06/16/2020 192  140 - 400 Thousand/uL Final  . MPV 06/16/2020 11.3  7.5 - 12.5 fL Final  . Neutro Abs 06/16/2020 3,880  1,500 - 7,800 cells/uL Final  . Lymphs Abs 06/16/2020 3,654  850 - 3,900 cells/uL Final  . Absolute Monocytes 06/16/2020 766  200 - 950 cells/uL Final  . Eosinophils Absolute 06/16/2020 331  15 - 500 cells/uL Final  . Basophils Absolute 06/16/2020 70  0 - 200 cells/uL Final  . Neutrophils Relative % 06/16/2020 44.6  % Final  . Total Lymphocyte 06/16/2020 42.0  % Final  . Monocytes Relative 06/16/2020 8.8  % Final  . Eosinophils Relative 06/16/2020 3.8  % Final  . Basophils Relative 06/16/2020 0.8  % Final  . Glucose, Bld 06/16/2020 376* 65 - 99 mg/dL Final    Comment: .            Fasting reference interval . For someone without known diabetes, a glucose value >125 mg/dL indicates that they may have diabetes and this should be confirmed with a follow-up test. .   . BUN 06/16/2020 33* 7 - 25 mg/dL Final  . Creat 06/16/2020 1.65* 0.70 - 1.25 mg/dL Final   Comment: For patients >56 years of age, the reference limit for Creatinine is approximately 13% higher for people identified as African-American. .   . GFR, Est Non African American 06/16/2020 42* > OR = 60 mL/min/1.57m2 Final  . GFR, Est African American 06/16/2020 48* > OR = 60 mL/min/1.32m2 Final  . BUN/Creatinine Ratio 06/16/2020 20  6 - 22 (calc) Final  . Sodium 06/16/2020 131* 135 - 146 mmol/L Final  . Potassium 06/16/2020 5.0  3.5 - 5.3 mmol/L Final  . Chloride 06/16/2020 94* 98 - 110 mmol/L Final  . CO2 06/16/2020 24  20 - 32 mmol/L Final  . Calcium 06/16/2020 9.8  8.6 - 10.3 mg/dL Final  . Total Protein 06/16/2020 6.8  6.1 - 8.1 g/dL Final  . Albumin 06/16/2020 4.0  3.6 - 5.1 g/dL Final  . Globulin 06/16/2020  2.8  1.9 - 3.7 g/dL (calc) Final  . AG Ratio 06/16/2020 1.4  1.0 - 2.5 (calc) Final  . Total Bilirubin 06/16/2020 1.0  0.2 - 1.2 mg/dL Final  . Alkaline phosphatase (APISO) 06/16/2020 122  35 - 144 U/L Final  . AST 06/16/2020 122* 10 - 35 U/L Final  . ALT 06/16/2020 109* 9 - 46 U/L Final  . Hgb A1c MFr Bld 06/16/2020 12.7* <5.7 % of total Hgb Final   Comment: For someone without known diabetes, a hemoglobin A1c value of 6.5% or greater indicates that they may have  diabetes and this should be confirmed with a follow-up  test. . For someone with known diabetes, a value <7% indicates  that their diabetes is well controlled and a value  greater than or equal to 7% indicates suboptimal  control. A1c targets should be individualized based on  duration of diabetes, age, comorbid conditions, and  other considerations. . Currently, no consensus exists regarding use  of hemoglobin A1c for diagnosis of diabetes for children. .   . Mean Plasma Glucose 06/16/2020 318  mg/dL Final  . eAG (mmol/L) 06/16/2020 17.6  mmol/L Final  . Cholesterol 06/16/2020 107  <200 mg/dL Final  . HDL 06/16/2020 29* > OR = 40 mg/dL Final  . Triglycerides 06/16/2020 232* <150 mg/dL Final   Comment: . If a non-fasting specimen was collected, consider repeat triglyceride testing on a fasting specimen if clinically indicated.  Yates Decamp et al. J. of Clin. Lipidol. 8657;8:469-629. .   . LDL Cholesterol (Calc) 06/16/2020 49  mg/dL (calc) Final   Comment: Reference range: <100 . Desirable range <100 mg/dL for primary prevention;   <70 mg/dL for patients with CHD or diabetic patients  with > or = 2 CHD risk factors. Marland Kitchen LDL-C is now calculated using the Martin-Hopkins  calculation, which is a validated novel method providing  better accuracy than the Friedewald equation in the  estimation of LDL-C.  Cresenciano Genre et al. Annamaria Helling. 5284;132(44): 2061-2068  (http://education.[facility].com/faq/FAQ164)   . Total CHOL/HDL Ratio 06/16/2020 3.7  <5.0 (calc) Final  . Non-HDL Cholesterol (Calc) 06/16/2020 78  <130 mg/dL (calc) Final   Comment: For patients with diabetes plus 1 major ASCVD risk  factor, treating to a non-HDL-C goal of <100 mg/dL  (LDL-C of <70 mg/dL) is considered a therapeutic  option.   . Creatinine, Urine 06/16/2020 116  20 - 320 mg/dL Final  . Microalb, Ur 06/16/2020 54.0  mg/dL Final   Comment: Verified by repeat analysis. Marland Kitchen Reference Range Not established   . Microalb Creat Ratio 06/16/2020 466* <30 mcg/mg creat Final   Comment: . The ADA defines abnormalities in albumin excretion as follows: Marland Kitchen Albuminuria Category        Result (mcg/mg creatinine) . Normal to Mildly increased   <30 Moderately increased         30-299  Severely increased           > OR = 300 . The ADA recommends that at least two of three specimens collected within a 3-6 month  period be abnormal before considering a patient to be within a diagnostic category.   . PSA, Total 06/16/2020 0.2  < OR = 4.0 ng/mL Final  . PSA, Free 06/16/2020 0.1  ng/mL Final  . PSA, % Free 06/16/2020 50  >25 % (calc) Final   Comment: . PSA(ng/mL)      Free PSA(%)     Estimated(x) Probability  of Cancer(as%) 0-2.5              (*)               Approx. 1 2.6-4.0(1)         0-27(2)                   24(3) 4.1-10(4)          0-10                      56                    11-15                     28                    16-20                     20                    21-25                     16                    >or =26                   8 >10(+)             N/A                      >50 . References:(1)Catalona et al.:Urology 60: 469-474 (2002)            (2)Catalona et al.:J.Urol 168: 922-925 (2002)               Free PSA(%)   Sensitivity(%)  Specificity(%)               < or = 25          85              19               < or = 30          93               9            (3)Catalona et al.:JAMA 277: 1452-1455 (1997)            (4)Catalona et al.:JAMA 279: 5681-2751 (1998) . (x)These estimates vary with age, ethnicity, family     history and DRE results. (*)The                           diagnostic usefulness of % Free PSA has not been    established in patients with total PSA below 2.6 ng/mL (+)In men with PSA above 10 ng/mL, prostate cancer risk is    determined by total PSA alone. . The Total PSA value from this assay system is  standardized against the equimolar PSA standard.  The test result will be approximately 20% higher  when compared to the [facility] Total PSA  (Siemens assay). Comparison of serial PSA results  should be interpreted with this fact in mind. Marland Kitchen PSA was performed using the [facility]  Immunoassay method. Values obtained from different assay methods cannot be used interchangeably. PSA levels, regardless  of value, should not be interpreted as absolute evidence of the presence or absence of disease. Marland Kitchen      Past Medical History:  Diagnosis Date  . Arthritis    "starting to get achy joints here and there" (11/28/2011)  . CKD (chronic kidney disease), stage III (HCC)    proteinuria 750 mg a day  . Colon polyps   . Diabetes mellitus without complication ([place])   . External bleeding hemorrhoids    "occasionally" (11/28/2011)  . Fatty liver disease, nonalcoholic   . GERD (gastroesophageal reflux disease)   . Hypertension     Past Surgical History:  Procedure Laterality Date  . 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 amLODipine (NORVASC) 10 MG tablet TAKE 1 TABLET(10 MG) BY MOUTH DAILY 90 tablet 1  . aspirin EC 81 MG tablet Take 81 mg by mouth daily.    Marland Kitchen atenolol (TENORMIN) 50 MG tablet TAKE 1 TABLET(50 MG) BY MOUTH DAILY 90 tablet 1  . atorvastatin (LIPITOR) 20 MG tablet TAKE 1 TABLET(20 MG) BY MOUTH DAILY 90 tablet 3  . Cetirizine HCl (ZYRTEC ALLERGY) 10 MG TBDP Take 10 mg by mouth at bedtime. 30 tablet 0  . fluticasone (FLONASE) 50 MCG/ACT nasal spray Place 2 sprays into the nose daily. (Patient taking differently: Place 2 sprays into the nose daily. PRN) 16 g 6  . glipiZIDE (GLUCOTROL XL) 10 MG 24 hr tablet Take 1 tablet (10 mg total) by mouth daily with breakfast. 30 tablet 0  . glucose blood test strip Check bs qd E11.9 100 each 12  . hydrochlorothiazide (MICROZIDE) 12.5 MG capsule TAKE 1 CAPSULE(12.5 MG) BY MOUTH DAILY 90 capsule 3  . hydrOXYzine (ATARAX/VISTARIL) 25 MG tablet TAKE 1 TABLET(25 MG) BY MOUTH AT BEDTIME AS NEEDED 90 tablet 1  . Lancets (ONETOUCH ULTRASOFT) lancets Use as instructed 100  each 12  . lisinopril (ZESTRIL) 40 MG tablet TAKE 1 TABLET(40 MG) BY MOUTH DAILY 90 tablet 0  . metFORMIN (GLUCOPHAGE) 500 MG tablet Take 1 tablet (500 mg total) by mouth 2 (two) times daily with a meal. 180 tablet 3  . mometasone (NASONEX) 50 MCG/ACT nasal spray Place 2 sprays into the nose daily. 17 g 1  . sildenafil (VIAGRA) 100 MG tablet Take 1 tablet (100 mg total) by mouth daily as needed for erectile dysfunction. 30 tablet 3  . triamcinolone cream (KENALOG) 0.1 % Apply 1 application topically 2 (two) times daily. (Patient not taking: Reported on 09/18/2019) 30 g 0   Current Facility-Administered Medications on File Prior to Visit  Medication Dose Route Frequency Provider Last Rate Last Admin  . ipratropium-albuterol (DUONEB) 0.5-2.5 (3) MG/3ML nebulizer solution 3 mL  3 mL Nebulization Once Delsa Grana, PA-C       No Known Allergies Social History   Socioeconomic History  . Marital status: Married    Spouse name: Not on file  . Number of children: Not on file  . Years of education: Not on file  . Highest education level: Not on file  Occupational History  . Not on file  Tobacco Use  . Smoking status: Former Smoker  Packs/day: 2.00    Years: 10.00    Pack years: 20.00    Types: Cigarettes  . Smokeless tobacco: Former Systems developer    Types: Chew  . Tobacco comment: 11/28/2011 "quit smoking ~ 30 yr ago; chewed some then quit that"  Substance and Sexual Activity  . Alcohol use: Yes    Alcohol/week: 6.0 standard drinks    Types: 6 Cans of beer per week    Comment: 11/28/2011 "drink a few beers 3 times/wk; ~ 6pk total/wk"  . Drug use: No  . Sexual activity: Yes  Other Topics Concern  . Not on file  Social History Narrative  . Not on file   Social Determinants of Health   Financial Resource Strain: Not on file  Food Insecurity: Not on file  Transportation Needs: Not on file  Physical Activity: Not on file  Stress: Not on file  Social Connections: Not on file  Intimate  Partner Violence: Not on file     Review of Systems  All other systems reviewed and are negative.      Objective:   Physical Exam Vitals reviewed.  Constitutional:      General: He is not in acute distress.    Appearance: Normal appearance. He is well-developed and normal weight. He is not ill-appearing, toxic-appearing or diaphoretic.  HENT:     Head: Normocephalic and atraumatic.  Neck:     Vascular: No carotid bruit.  Cardiovascular:     Rate and Rhythm: Normal rate and regular rhythm.     Pulses: Normal pulses.     Heart sounds: Normal heart sounds. No murmur heard. No friction rub. No gallop.   Pulmonary:     Effort: Pulmonary effort is normal. No respiratory distress.     Breath sounds: Normal breath sounds. No stridor. No wheezing, rhonchi or rales.  Chest:     Chest wall: No tenderness.  Lymphadenopathy:     Cervical: No cervical adenopathy.  Neurological:     General: No focal deficit present.     Mental Status: He is alert and oriented to person, place, and time. Mental status is at baseline.     Cranial Nerves: No cranial nerve deficit.     Motor: No weakness.     Coordination: Coordination normal.     Gait: Gait normal.           Assessment & Plan:  Stage 3 chronic kidney disease, unspecified whether stage 3a or 3b CKD (HCC)  Fatty liver disease, nonalcoholic  Benign essential HTN  Uncontrolled type 2 diabetes mellitus with hyperglycemia (HCC)  Due to renal function discontinue metformin.  Replace with Farxiga 10 mg a day given his renal dysfunction and his elevated microalbumin to creatinine ratio.  Recheck in 1 month with fasting blood sugars and 2-hour postprandial sugars.  Goal fasting blood sugars between 101 130.  Goal 2-hour postprandial sugars are between 101 180.  We will add additional medication at that time if necessary.  I believe that his elevated liver function test are due to an exacerbation of his fatty liver disease due to his severe  hyperglycemia.  His blood pressure today is well controlled.

## 2020-06-25 ENCOUNTER — Other Ambulatory Visit: Payer: Self-pay | Admitting: *Deleted

## 2020-06-25 MED ORDER — GLUCOSE BLOOD VI STRP: ORAL_STRIP | 12 refills | Status: DC

## 2020-06-25 MED ORDER — ONETOUCH ULTRASOFT LANCETS MISC: 12 refills | Status: DC

## 2020-06-30 DIAGNOSIS — E119 Type 2 diabetes mellitus without complications: Secondary | ICD-10-CM | POA: Diagnosis not present

## 2020-06-30 DIAGNOSIS — H5203 Hypermetropia, bilateral: Secondary | ICD-10-CM | POA: Diagnosis not present

## 2020-06-30 DIAGNOSIS — H52203 Unspecified astigmatism, bilateral: Secondary | ICD-10-CM | POA: Diagnosis not present

## 2020-06-30 DIAGNOSIS — H2513 Age-related nuclear cataract, bilateral: Secondary | ICD-10-CM | POA: Diagnosis not present

## 2020-06-30 LAB — HM DIABETES EYE EXAM

## 2020-07-03 ENCOUNTER — Encounter: Payer: Self-pay | Admitting: Family Medicine

## 2020-07-13 ENCOUNTER — Other Ambulatory Visit: Payer: Self-pay | Admitting: Family Medicine

## 2020-07-13 ENCOUNTER — Telehealth: Payer: Self-pay

## 2020-07-13 ENCOUNTER — Encounter: Payer: Self-pay | Admitting: Family Medicine

## 2020-07-13 DIAGNOSIS — E1165 Type 2 diabetes mellitus with hyperglycemia: Secondary | ICD-10-CM

## 2020-07-13 NOTE — Telephone Encounter (Signed)
Forms faxed back to [facility] Dept

## 2020-07-14 ENCOUNTER — Encounter: Payer: Self-pay | Admitting: Family Medicine

## 2020-07-15 ENCOUNTER — Encounter: Payer: Self-pay | Admitting: *Deleted

## 2020-07-15 NOTE — Progress Notes (Signed)
Subjective:   Bradley Baldwin is a 70 y.o. male who presents for Medicare Annual/Subsequent preventive examination.  Review of Systems    N/A  Cardiac Risk Factors include: advanced age (>83men, >69 women);male gender;hypertension;diabetes mellitus;dyslipidemia     Objective:    Today's Vitals   07/16/20 1445  BP: 100/60  Temp: 98.3 F (36.8 C)  TempSrc: Oral  Weight: 166 lb (75.3 kg)  Height: 5\' 4"  (1.626 m)   Body mass index is 28.49 kg/m.  Advanced Directives 07/16/2020 11/28/2011 11/21/2011  Does Patient Have a Medical Advance Directive? Yes Patient has advance directive, copy not in chart Patient has advance directive, copy not in chart  Type of Advance Directive [place];Living will - [place]  Does patient want to make changes to medical advance directive? No - Patient declined - -  Copy of [place] in Chart? No - copy requested Copy requested from other (Comment) -    Current Medications (verified) Outpatient Encounter Medications as of 07/16/2020  Medication Sig   amLODipine (NORVASC) 10 MG tablet TAKE 1 TABLET(10 MG) BY MOUTH DAILY   aspirin EC 81 MG tablet Take 81 mg by mouth daily.   atenolol (TENORMIN) 50 MG tablet TAKE 1 TABLET(50 MG) BY MOUTH DAILY   atorvastatin (LIPITOR) 20 MG tablet TAKE 1 TABLET(20 MG) BY MOUTH DAILY   Cetirizine HCl (ZYRTEC ALLERGY) 10 MG TBDP Take 10 mg by mouth at bedtime.   dapagliflozin propanediol (FARXIGA) 10 MG TABS tablet Take 1 tablet (10 mg total) by mouth daily before breakfast.   fluticasone (FLONASE) 50 MCG/ACT nasal spray Place 2 sprays into the nose daily. (Patient taking differently: Place 2 sprays into the nose daily. PRN)   glipiZIDE (GLUCOTROL XL) 10 MG 24 hr tablet TAKE 1 TABLET(10 MG) BY MOUTH DAILY WITH BREAKFAST   glucose blood test strip Use as directed to monitor FSBS 2x daily. Dx E11.9.   hydrochlorothiazide (MICROZIDE) 12.5 MG capsule TAKE 1  CAPSULE(12.5 MG) BY MOUTH DAILY   hydrOXYzine (ATARAX/VISTARIL) 25 MG tablet TAKE 1 TABLET(25 MG) BY MOUTH AT BEDTIME AS NEEDED   Lancets (ONETOUCH ULTRASOFT) lancets Use as directed to monitor FSBS 2x daily. Dx E11.9.   lisinopril (ZESTRIL) 40 MG tablet TAKE 1 TABLET(40 MG) BY MOUTH DAILY   sildenafil (VIAGRA) 100 MG tablet Take 1 tablet (100 mg total) by mouth daily as needed for erectile dysfunction.   triamcinolone cream (KENALOG) 0.1 % Apply 1 application topically 2 (two) times daily.   metFORMIN (GLUCOPHAGE) 500 MG tablet Take 1 tablet (500 mg total) by mouth 2 (two) times daily with a meal. (Patient not taking: Reported on 07/16/2020)   mometasone (NASONEX) 50 MCG/ACT nasal spray Place 2 sprays into the nose daily. (Patient not taking: Reported on 07/16/2020)   Facility-Administered Encounter Medications as of 07/16/2020  Medication   ipratropium-albuterol (DUONEB) 0.5-2.5 (3) MG/3ML nebulizer solution 3 mL    Allergies (verified) Patient has no known allergies.   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 No pertinent family history. Social History   Socioeconomic History   Marital status: Married    Spouse name: Not on file   Number of children: Not on file   Years of education: Not on file    Highest education level: Not on file  Occupational History   Not on file  Tobacco Use   Smoking status: Former    Packs/day: 2.00    Years: 10.00    Pack years: 20.00    Types: Cigarettes   Smokeless tobacco: Former    Types: Chew   Tobacco comments:    11/28/2011 "quit smoking ~ 30 yr ago; chewed some then quit that"  Substance and Sexual Activity   Alcohol use: Yes    Alcohol/week: 6.0 standard drinks    Types: 6 Cans of beer per week    Comment: 11/28/2011 "drink a few beers 3 times/wk; ~ 6pk total/wk"   Drug use: No   Sexual activity: Yes  Other Topics Concern   Not on file  Social History Narrative   Not on file   Social Determinants of Health   Financial Resource Strain: Low Risk    Difficulty of Paying Living Expenses: Not hard at all  Food Insecurity: No Food Insecurity   Worried About Charity fundraiser in the Last Year: Never true   [place] in the Last Year: Never true  Transportation Needs: No Transportation Needs   Lack of Transportation (Medical): No   Lack of Transportation (Non-Medical): No  Physical Activity: Inactive   Days of Exercise per Week: 0 days   Minutes of Exercise per Session: 0 min  Stress: No Stress Concern Present   Feeling of Stress : Not at all  Social Connections: Moderately Isolated   Frequency of Communication with Friends and Family: Twice a week   Frequency of Social Gatherings with Friends and Family: Once a week   Attends Religious Services: Never   Marine scientist or Organizations: No   Attends Archivist Meetings: Never   Marital Status: Married    Tobacco Counseling Counseling given: Not Answered Tobacco comments: 11/28/2011 "quit smoking ~ 30 yr ago; chewed some then quit that"   Clinical Intake:  Pre-visit preparation completed: Yes  Pain : No/denies pain     Nutritional Risks: None Diabetes: No  How often do you need to have someone help you when you read instructions, pamphlets, or  other written materials from your doctor or pharmacy?: 1 - Never  Diabetic? Yes Nutrition Risk Assessment:  Has the patient had any N/V/D within the last 2 months?  No  Does the patient have any non-healing wounds?  No  Has the patient had any unintentional weight loss or weight gain?  No   Diabetes:  Is the patient diabetic?  Yes  If diabetic, was a CBG obtained today?  No  Did the patient bring in their glucometer from home?  No  How often do you monitor your CBG's? Patient states he checks his glucose 2x per day.   Financial Strains and Diabetes Management:  Are you having any financial strains with the device, your supplies or your medication? No .  Does the patient want  to be seen by Chronic Care Management for management of their diabetes?  No  Would the patient like to be referred to a Nutritionist or for Diabetic Management?  No   Diabetic Exams:  Diabetic Eye Exam: Completed 06/30/2020 Diabetic Foot Exam: Completed 09/18/2019   Interpreter Needed?: No  Information entered by :: [place] of Daily Living In your present state of health, do you have any difficulty performing the following activities: 07/16/2020  Hearing? 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 N  Difficulty concentrating or making decisions? N  Walking or climbing stairs? N  Dressing or bathing? N  Doing errands, shopping? N  Preparing Food and eating ? N  Using the Toilet? N  In the past six months, have you accidently leaked urine? N  Do you have problems with loss of bowel control? N  Managing your Medications? N  Managing your Finances? N  Housekeeping or managing your Housekeeping? N  Some recent data might be hidden    Patient Care Team: Susy Frizzle, MD as PCP - General (Family Medicine)  Indicate any recent Medical Services you may have received from other than [facility] providers in the past year (date may be approximate).     Assessment:   This is a routine  wellness examination for Jguadalupe.  Hearing/Vision screen Vision Screening - Comments:: Patient states gets eyes examined once per year. Currently wears glasses   Dietary issues and exercise activities discussed: Current Exercise Habits: The patient does not participate in regular exercise at present, Exercise limited by: None identified   Goals Addressed             This Visit's Progress    HEMOGLOBIN A1C < 7       Weight (lb) < 155 lb (70.3 kg)   166 lb (75.3 kg)      Depression Screen PHQ 2/9 Scores 07/16/2020 09/18/2019 12/02/2018 08/17/2017 08/17/2017 06/02/2016 05/11/2016  PHQ - 2 Score 0 0 0 0 0 0 0  PHQ- 9 Score - - - - - - 0    Fall Risk Fall Risk  07/16/2020 09/18/2019 12/02/2018 08/17/2017 08/17/2017  Falls in the past year? 0 0 0 No No  Number falls in past yr: 0 - - - -  Injury with Fall? 0 - - - -  Risk for fall due to : No Fall Risks No Fall Risks - - -  Follow up Falls evaluation completed;Falls prevention discussed Falls evaluation completed Falls evaluation completed - -    FALL RISK PREVENTION PERTAINING TO THE HOME:  Any stairs in or around the home? Yes  If so, are there any without handrails? No  Home free of loose throw rugs in walkways, pet beds, electrical cords, etc? Yes  Adequate lighting in your home to reduce risk of falls? Yes   ASSISTIVE DEVICES UTILIZED TO PREVENT FALLS:  Life alert? No  Use of a cane, [place] or w/c? No  Grab bars in the bathroom? No  Shower chair or bench in shower? No  Elevated toilet seat or a handicapped toilet? No   TIMED UP AND GO:  Was the test performed? Yes .  Length of time to ambulate 10 feet: 3 sec.   Gait steady and fast without use of assistive device  Cognitive Function:  Normal cognitive status assessed by direct observation by this Nurse Health Advisor. No abnormalities found.        Immunizations Immunization History  Administered Date(s) Administered  Fluad Quad(high Dose 65+) 10/22/2018, 11/12/2019    Influenza, High Dose Seasonal PF 11/27/2016   Influenza,inj,Quad PF,6+ Mos 01/27/2013, 11/12/2013, 12/01/2014, 11/18/2015, 10/19/2017   Moderna Sars-Covid-2 Vaccination 02/19/2019, 03/19/2019   Pneumococcal Conjugate-13 06/02/2016   Pneumococcal Polysaccharide-23 05/29/2014   Tdap 11/16/2009, 02/28/2013    TDAP status: Up to date  Flu Vaccine status: Up to date  Pneumococcal vaccine status: Up to date  Covid-19 vaccine status: Completed vaccines  Qualifies for Shingles Vaccine? Yes   Zostavax completed No   Shingrix Completed?: No.    Education has been provided regarding the importance of this vaccine. Patient has been advised to call insurance company to determine out of pocket expense if they have not yet received this vaccine. Advised may also receive vaccine at local pharmacy or Health Dept. Verbalized acceptance and understanding.  Screening Tests Health Maintenance  Topic Date Due   Zoster Vaccines- Shingrix (1 of 2) Never done   COVID-19 Vaccine (3 - Moderna risk series) 04/16/2019   PNA vac Low Risk Adult (2 of 2 - PPSV23) 05/29/2019   INFLUENZA VACCINE  08/30/2020   FOOT EXAM  09/17/2020   HEMOGLOBIN A1C  12/17/2020   OPHTHALMOLOGY EXAM  06/30/2021   TETANUS/TDAP  03/01/2023   COLONOSCOPY (Pts 45-32yrs Insurance coverage will need to be confirmed)  02/16/2027   HPV VACCINES  Aged Out   Hepatitis C Screening  Discontinued    Health Maintenance  Health Maintenance Due  Topic Date Due   Zoster Vaccines- Shingrix (1 of 2) Never done   COVID-19 Vaccine (3 - Moderna risk series) 04/16/2019   PNA vac Low Risk Adult (2 of 2 - PPSV23) 05/29/2019    Colorectal cancer screening: Type of screening: Colonoscopy. Completed 02/15/2017. Repeat every 10 years  Lung Cancer Screening: (Low Dose CT Chest recommended if Age 9-80 years, 30 pack-year currently smoking OR have quit w/in 15years.) does not qualify.   Lung Cancer Screening Referral: N/A   Additional  Screening:  Hepatitis C Screening: does qualify;   Vision Screening: Recommended annual ophthalmology exams for early detection of glaucoma and other disorders of the eye. Is the patient up to date with their annual eye exam?  Yes  Who is the provider or what is the name of the office in which the patient attends annual eye exams? [facility]  If pt is not established with a provider, would they like to be referred to a provider to establish care? No .   Dental Screening: Recommended annual dental exams for proper oral hygiene  Community Resource Referral / Chronic Care Management: CRR required this visit?  No   CCM required this visit?  No      Plan:     I have personally reviewed and noted the following in the patient's chart:   Medical and social history Use of alcohol, tobacco or illicit drugs  Current medications and supplements including opioid prescriptions. Patient is not currently taking opioid prescriptions. Functional ability and status Nutritional status Physical activity Advanced directives List of other physicians Hospitalizations, surgeries, and ER visits in previous 12 months Vitals Screenings to include cognitive, depression, and falls Referrals and appointments  In addition, I have reviewed and discussed with patient certain preventive protocols, quality metrics, and best practice recommendations. A written personalized care plan for preventive services as well as general preventive health recommendations were provided to patient.     Ofilia Neas, LPN   7/82/4235   Nurse Notes: None

## 2020-07-16 ENCOUNTER — Other Ambulatory Visit: Payer: Self-pay

## 2020-07-16 ENCOUNTER — Ambulatory Visit (INDEPENDENT_AMBULATORY_CARE_PROVIDER_SITE_OTHER): Payer: Medicare Other

## 2020-07-16 VITALS — BP 100/60 | Temp 98.3°F | Ht 64.0 in | Wt 166.0 lb

## 2020-07-16 DIAGNOSIS — Z0000 Encounter for general adult medical examination without abnormal findings: Secondary | ICD-10-CM | POA: Diagnosis not present

## 2020-07-16 NOTE — Patient Instructions (Signed)
Bradley Baldwin , Thank you for taking time to come for your Medicare Wellness Visit. I appreciate your ongoing commitment to your health goals. Please review the following plan we discussed and let me know if I can assist you in the future.   Screening recommendations/referrals: Colonoscopy: Up to date, next due 02/16/2027 Recommended yearly ophthalmology/optometry visit for glaucoma screening and checkup Recommended yearly dental visit for hygiene and checkup  Vaccinations: Influenza vaccine: Up to date next due fall 2022  Pneumococcal vaccine: Completed series Tdap vaccine: Up to date, next due 03/01/2023 Shingles vaccine: Currently due for Shingrix, you may receive at your pharmacy    Advanced directives: Please bring in a copy of your advanced medical directives so that we may scan into your chart.  Conditions/risks identified: None   Next appointment: None   Preventive Care 65 Years and Older, Male Preventive care refers to lifestyle choices and visits with your health care provider that can promote health and wellness. What does preventive care include? A yearly physical exam. This is also called an annual well check. Dental exams once or twice a year. Routine eye exams. Ask your health care provider how often you should have your eyes checked. Personal lifestyle choices, including: Daily care of your teeth and gums. Regular physical activity. Eating a healthy diet. Avoiding tobacco and drug use. Limiting alcohol use. Practicing safe sex. Taking low doses of aspirin every day. Taking vitamin and mineral supplements as recommended by your health care provider. What happens during an annual well check? The services and screenings done by your health care provider during your annual well check will depend on your age, overall health, lifestyle risk factors, and family history of disease. Counseling  Your health care provider may ask you questions about your: Alcohol  use. Tobacco use. Drug use. Emotional well-being. Home and relationship well-being. Sexual activity. Eating habits. History of falls. Memory and ability to understand (cognition). Work and work Statistician. Screening  You may have the following tests or measurements: Height, weight, and BMI. Blood pressure. Lipid and cholesterol levels. These may be checked every 5 years, or more frequently if you are over 38 years old. Skin check. Lung cancer screening. You may have this screening every year starting at age 40 if you have a 30-pack-year history of smoking and currently smoke or have quit within the past 15 years. Fecal occult blood test (FOBT) of the stool. You may have this test every year starting at age 64. Flexible sigmoidoscopy or colonoscopy. You may have a sigmoidoscopy every 5 years or a colonoscopy every 10 years starting at age 67. Prostate cancer screening. Recommendations will vary depending on your family history and other risks. Hepatitis C blood test. Hepatitis B blood test. Sexually transmitted disease (STD) testing. Diabetes screening. This is done by checking your blood sugar (glucose) after you have not eaten for a while (fasting). You may have this done every 1-3 years. Abdominal aortic aneurysm (AAA) screening. You may need this if you are a current or former smoker. Osteoporosis. You may be screened starting at age 70 if you are at high risk. Talk with your health care provider about your test results, treatment options, and if necessary, the need for more tests. Vaccines  Your health care provider may recommend certain vaccines, such as: Influenza vaccine. This is recommended every year. Tetanus, diphtheria, and acellular pertussis (Tdap, Td) vaccine. You may need a Td booster every 10 years. Zoster vaccine. You may need this after age 46. Pneumococcal  13-valent conjugate (PCV13) vaccine. One dose is recommended after age 69. Pneumococcal polysaccharide  (PPSV23) vaccine. One dose is recommended after age 14. Talk to your health care provider about which screenings and vaccines you need and how often you need them. This information is not intended to replace advice given to you by your health care provider. Make sure you discuss any questions you have with your health care provider. Document Released: 02/12/2015 Document Revised: 10/06/2015 Document Reviewed: 11/17/2014 Elsevier Interactive Patient Education  2017 [place] Prevention in the Home Falls can cause injuries. They can happen to people of all ages. There are many things you can do to make your home safe and to help prevent falls. What can I do on the outside of my home? Regularly fix the edges of walkways and driveways and fix any cracks. Remove anything that might make you trip as you walk through a door, such as a raised step or threshold. Trim any bushes or trees on the path to your home. Use bright outdoor lighting. Clear any walking paths of anything that might make someone trip, such as rocks or tools. Regularly check to see if handrails are loose or broken. Make sure that both sides of any steps have handrails. Any raised decks and porches should have guardrails on the edges. Have any leaves, snow, or ice cleared regularly. Use sand or salt on walking paths during winter. Clean up any spills in your garage right away. This includes oil or grease spills. What can I do in the bathroom? Use night lights. Install grab bars by the toilet and in the tub and shower. Do not use towel bars as grab bars. Use non-skid mats or decals in the tub or shower. If you need to sit down in the shower, use a plastic, non-slip stool. Keep the floor dry. Clean up any water that spills on the floor as soon as it happens. Remove soap buildup in the tub or shower regularly. Attach bath mats securely with double-sided non-slip rug tape. Do not have throw rugs and other things on the  floor that can make you trip. What can I do in the bedroom? Use night lights. Make sure that you have a light by your bed that is easy to reach. Do not use any sheets or blankets that are too big for your bed. They should not hang down onto the floor. Have a firm chair that has side arms. You can use this for support while you get dressed. Do not have throw rugs and other things on the floor that can make you trip. What can I do in the kitchen? Clean up any spills right away. Avoid walking on wet floors. Keep items that you use a lot in easy-to-reach places. If you need to reach something above you, use a strong step stool that has a grab bar. Keep electrical cords out of the way. Do not use floor polish or wax that makes floors slippery. If you must use wax, use non-skid floor wax. Do not have throw rugs and other things on the floor that can make you trip. What can I do with my stairs? Do not leave any items on the stairs. Make sure that there are handrails on both sides of the stairs and use them. Fix handrails that are broken or loose. Make sure that handrails are as long as the stairways. Check any carpeting to make sure that it is firmly attached to the stairs. Fix any carpet  that is loose or worn. Avoid having throw rugs at the top or bottom of the stairs. If you do have throw rugs, attach them to the floor with carpet tape. Make sure that you have a light switch at the top of the stairs and the bottom of the stairs. If you do not have them, ask someone to add them for you. What else can I do to help prevent falls? Wear shoes that: Do not have high heels. Have rubber bottoms. Are comfortable and fit you well. Are closed at the toe. Do not wear sandals. If you use a stepladder: Make sure that it is fully opened. Do not climb a closed stepladder. Make sure that both sides of the stepladder are locked into place. Ask someone to hold it for you, if possible. Clearly mark and make  sure that you can see: Any grab bars or handrails. First and last steps. Where the edge of each step is. Use tools that help you move around (mobility aids) if they are needed. These include: Canes. Walkers. Scooters. Crutches. Turn on the lights when you go into a dark area. Replace any light bulbs as soon as they burn out. Set up your furniture so you have a clear path. Avoid moving your furniture around. If any of your floors are uneven, fix them. If there are any pets around you, be aware of where they are. Review your medicines with your doctor. Some medicines can make you feel dizzy. This can increase your chance of falling. Ask your doctor what other things that you can do to help prevent falls. This information is not intended to replace advice given to you by your health care provider. Make sure you discuss any questions you have with your health care provider. Document Released: 11/12/2008 Document Revised: 06/24/2015 Document Reviewed: 02/20/2014 Elsevier Interactive Patient Education  2017 [employer].

## 2020-07-21 ENCOUNTER — Encounter: Payer: Self-pay | Admitting: *Deleted

## 2020-08-12 ENCOUNTER — Other Ambulatory Visit: Payer: Self-pay | Admitting: *Deleted

## 2020-08-12 MED ORDER — DAPAGLIFLOZIN PROPANEDIOL 10 MG PO TABS: 10.0000 mg | ORAL_TABLET | Freq: Every day | ORAL | 3 refills | Status: DC

## 2020-08-28 ENCOUNTER — Other Ambulatory Visit: Payer: Self-pay | Admitting: Family Medicine

## 2020-08-28 DIAGNOSIS — I10 Essential (primary) hypertension: Secondary | ICD-10-CM

## 2020-08-30 ENCOUNTER — Encounter: Payer: Self-pay | Admitting: Family Medicine

## 2020-08-31 ENCOUNTER — Other Ambulatory Visit: Payer: Medicare Other

## 2020-08-31 ENCOUNTER — Other Ambulatory Visit: Payer: Self-pay

## 2020-08-31 DIAGNOSIS — E119 Type 2 diabetes mellitus without complications: Secondary | ICD-10-CM

## 2020-08-31 DIAGNOSIS — K760 Fatty (change of) liver, not elsewhere classified: Secondary | ICD-10-CM | POA: Diagnosis not present

## 2020-08-31 DIAGNOSIS — Z13220 Encounter for screening for lipoid disorders: Secondary | ICD-10-CM

## 2020-08-31 DIAGNOSIS — Z79899 Other long term (current) drug therapy: Secondary | ICD-10-CM | POA: Diagnosis not present

## 2020-08-31 DIAGNOSIS — Z0000 Encounter for general adult medical examination without abnormal findings: Secondary | ICD-10-CM

## 2020-08-31 DIAGNOSIS — Z125 Encounter for screening for malignant neoplasm of prostate: Secondary | ICD-10-CM | POA: Diagnosis not present

## 2020-08-31 DIAGNOSIS — I10 Essential (primary) hypertension: Secondary | ICD-10-CM | POA: Diagnosis not present

## 2020-08-31 DIAGNOSIS — R7303 Prediabetes: Secondary | ICD-10-CM

## 2020-09-01 LAB — CBC WITH DIFFERENTIAL/PLATELET
Absolute Monocytes: 662 cells/uL (ref 200–950)
Basophils Absolute: 77 cells/uL (ref 0–200)
Basophils Relative: 1 %
Eosinophils Absolute: 246 cells/uL (ref 15–500)
Eosinophils Relative: 3.2 %
HCT: 37.8 % — ABNORMAL LOW (ref 38.5–50.0)
Hemoglobin: 12.6 g/dL — ABNORMAL LOW (ref 13.2–17.1)
Lymphs Abs: 3149 cells/uL (ref 850–3900)
MCH: 33 pg (ref 27.0–33.0)
MCHC: 33.3 g/dL (ref 32.0–36.0)
MCV: 99 fL (ref 80.0–100.0)
MPV: 10.3 fL (ref 7.5–12.5)
Monocytes Relative: 8.6 %
Neutro Abs: 3565 cells/uL (ref 1500–7800)
Neutrophils Relative %: 46.3 %
Platelets: 211 10*3/uL (ref 140–400)
RBC: 3.82 10*6/uL — ABNORMAL LOW (ref 4.20–5.80)
RDW: 11.6 % (ref 11.0–15.0)
Total Lymphocyte: 40.9 %
WBC: 7.7 10*3/uL (ref 3.8–10.8)

## 2020-09-01 LAB — COMPREHENSIVE METABOLIC PANEL
AG Ratio: 1.8 (calc) (ref 1.0–2.5)
ALT: 29 U/L (ref 9–46)
AST: 27 U/L (ref 10–35)
Albumin: 4.3 g/dL (ref 3.6–5.1)
Alkaline phosphatase (APISO): 76 U/L (ref 35–144)
BUN/Creatinine Ratio: 25 (calc) — ABNORMAL HIGH (ref 6–22)
BUN: 43 mg/dL — ABNORMAL HIGH (ref 7–25)
CO2: 25 mmol/L (ref 20–32)
Calcium: 9.7 mg/dL (ref 8.6–10.3)
Chloride: 106 mmol/L (ref 98–110)
Creat: 1.7 mg/dL — ABNORMAL HIGH (ref 0.70–1.35)
Globulin: 2.4 g/dL (calc) (ref 1.9–3.7)
Glucose, Bld: 143 mg/dL — ABNORMAL HIGH (ref 65–99)
Potassium: 4.7 mmol/L (ref 3.5–5.3)
Sodium: 139 mmol/L (ref 135–146)
Total Bilirubin: 0.5 mg/dL (ref 0.2–1.2)
Total Protein: 6.7 g/dL (ref 6.1–8.1)

## 2020-09-01 LAB — HEMOGLOBIN A1C
Hgb A1c MFr Bld: 5.7 % of total Hgb — ABNORMAL HIGH (ref <5.7)
Mean Plasma Glucose: 117 mg/dL
eAG (mmol/L): 6.5 mmol/L

## 2020-09-06 DIAGNOSIS — M1712 Unilateral primary osteoarthritis, left knee: Secondary | ICD-10-CM | POA: Diagnosis not present

## 2020-09-07 ENCOUNTER — Ambulatory Visit (INDEPENDENT_AMBULATORY_CARE_PROVIDER_SITE_OTHER): Payer: Medicare Other | Admitting: Family Medicine

## 2020-09-07 ENCOUNTER — Encounter: Payer: Self-pay | Admitting: Family Medicine

## 2020-09-07 ENCOUNTER — Other Ambulatory Visit: Payer: Self-pay

## 2020-09-07 VITALS — BP 110/58 | HR 60 | Temp 98.2°F | Resp 14 | Ht 64.0 in | Wt 162.0 lb

## 2020-09-07 DIAGNOSIS — E118 Type 2 diabetes mellitus with unspecified complications: Secondary | ICD-10-CM

## 2020-09-07 DIAGNOSIS — N183 Chronic kidney disease, stage 3 unspecified: Secondary | ICD-10-CM | POA: Diagnosis not present

## 2020-09-07 DIAGNOSIS — I10 Essential (primary) hypertension: Secondary | ICD-10-CM | POA: Diagnosis not present

## 2020-09-07 NOTE — Progress Notes (Signed)
Subjective:    Patient ID: Bradley Baldwin, male    DOB: 11-May-1950, 70 y.o.   MRN: [id number]  HPI Please see the last office visit.  Patient has done outstanding!  He has dropped his A1c from 12-5.7.  He is reduced his liver function test from 120 down to the 20s.  Blood sugars are outstanding.  Blood pressure is down to 110/58.  I am so proud of this patient for the hard work that he is done.  He has drastically changed his diet.  He has drastically reduced his carbohydrates and as a result his lab work is Media pl[employer].  I congratulated him profusely on his accomplishments.  Creatinine remains elevated as we would anticipate due to chronic kidney disease however the remainder of his lab work looks beautiful.  I am concerned about hypoglycemia with his A1c down to 5.7 and he is taking glipizide. Appointment on 08/31/2020  Component Date Value Ref Range Status   Hgb A1c MFr Bld 08/31/2020 5.7 (A) <5.7 % of total Hgb Final   Comment: For someone without known diabetes, a hemoglobin  A1c value between 5.7% and 6.4% is consistent with prediabetes and should be confirmed with a  follow-up test. . For someone with known diabetes, a value <7% indicates that their diabetes is well controlled. A1c targets should be individualized based on duration of diabetes, age, comorbid conditions, and other considerations. . This assay result is consistent with an increased risk of diabetes. . Currently, no consensus exists regarding use of hemoglobin A1c for diagnosis of diabetes for children. .    Mean Plasma Glucose 08/31/2020 117  mg/dL Final   eAG (mmol/L) 08/31/2020 6.5  mmol/L Final   Glucose, Bld 08/31/2020 143 (A) 65 - 99 mg/dL Final   Comment: .            Fasting reference interval . For someone without known diabetes, a glucose value >125 mg/dL indicates that they may have diabetes and this should be confirmed with a follow-up test. .    BUN 08/31/2020 43 (A) 7 - 25 mg/dL Final   Creat  08/31/2020 1.70 (A) 0.70 - 1.35 mg/dL Final   BUN/Creatinine Ratio 08/31/2020 25 (A) 6 - 22 (calc) Final   Sodium 08/31/2020 139  135 - 146 mmol/L Final   Potassium 08/31/2020 4.7  3.5 - 5.3 mmol/L Final   Chloride 08/31/2020 106  98 - 110 mmol/L Final   CO2 08/31/2020 25  20 - 32 mmol/L Final   Calcium 08/31/2020 9.7  8.6 - 10.3 mg/dL Final   Total Protein 08/31/2020 6.7  6.1 - 8.1 g/dL Final   Albumin 08/31/2020 4.3  3.6 - 5.1 g/dL Final   Globulin 08/31/2020 2.4  1.9 - 3.7 g/dL (calc) Final   AG Ratio 08/31/2020 1.8  1.0 - 2.5 (calc) Final   Total Bilirubin 08/31/2020 0.5  0.2 - 1.2 mg/dL Final   Alkaline phosphatase (APISO) 08/31/2020 76  35 - 144 U/L Final   AST 08/31/2020 27  10 - 35 U/L Final   ALT 08/31/2020 29  9 - 46 U/L Final   WBC 08/31/2020 7.7  3.8 - 10.8 Thousand/uL Final   RBC 08/31/2020 3.82 (A) 4.20 - 5.80 Million/uL Final   Hemoglobin 08/31/2020 12.6 (A) 13.2 - 17.1 g/dL Final   HCT 08/31/2020 37.8 (A) 38.5 - 50.0 % Final   MCV 08/31/2020 99.0  80.0 - 100.0 fL Final   MCH 08/31/2020 33.0  27.0 - 33.0 pg Final  MCHC 08/31/2020 33.3  32.0 - 36.0 g/dL Final   RDW 08/31/2020 11.6  11.0 - 15.0 % Final   Platelets 08/31/2020 211  140 - 400 Thousand/uL Final   MPV 08/31/2020 10.3  7.5 - 12.5 fL Final   Neutro Abs 08/31/2020 3,565  1,500 - 7,800 cells/uL Final   Lymphs Abs 08/31/2020 3,149  850 - 3,900 cells/uL Final   Absolute Monocytes 08/31/2020 662  200 - 950 cells/uL Final   Eosinophils Absolute 08/31/2020 246  15 - 500 cells/uL Final   Basophils Absolute 08/31/2020 77  0 - 200 cells/uL Final   Neutrophils Relative % 08/31/2020 46.3  % Final   Total Lymphocyte 08/31/2020 40.9  % Final   Monocytes Relative 08/31/2020 8.6  % Final   Eosinophils Relative 08/31/2020 3.2  % Final   Basophils Relative 08/31/2020 1.0  % Final     Past Medical History:  Diagnosis Date   Arthritis    "starting to get achy joints here and there" (11/28/2011)   CKD (chronic kidney  disease), stage III (HCC)    proteinuria 750 mg a day   Colon polyps    Diabetes mellitus without complication (HCC)    External bleeding hemorrhoids    "occasionally" (11/28/2011)   Fatty liver disease, nonalcoholic    GERD (gastroesophageal reflux disease)    Hypertension     Past Surgical History:  Procedure Laterality Date   TOTAL HIP ARTHROPLASTY  11/28/2011   right   TOTAL HIP ARTHROPLASTY  11/28/2011   Procedure: TOTAL HIP ARTHROPLASTY ANTERIOR APPROACH;  Surgeon: Hessie Dibble, MD;  Location: [facility];  Service: Orthopedics;  Laterality: Right;   Current Outpatient Medications on File Prior to Visit  Medication Sig Dispense Refill   amLODipine (NORVASC) 10 MG tablet TAKE 1 TABLET(10 MG) BY MOUTH DAILY 90 tablet 1   aspirin EC 81 MG tablet Take 81 mg by mouth daily.     atenolol (TENORMIN) 50 MG tablet TAKE 1 TABLET(50 MG) BY MOUTH DAILY 90 tablet 1   atorvastatin (LIPITOR) 20 MG tablet TAKE 1 TABLET(20 MG) BY MOUTH DAILY 90 tablet 3   Cetirizine HCl (ZYRTEC ALLERGY) 10 MG TBDP Take 10 mg by mouth at bedtime. 30 tablet 0   dapagliflozin propanediol (FARXIGA) 10 MG TABS tablet Take 1 tablet (10 mg total) by mouth daily before breakfast. 90 tablet 3   fluticasone (FLONASE) 50 MCG/ACT nasal spray Place 2 sprays into the nose daily. (Patient taking differently: Place 2 sprays into the nose daily. PRN) 16 g 6   glipiZIDE (GLUCOTROL XL) 10 MG 24 hr tablet TAKE 1 TABLET(10 MG) BY MOUTH DAILY WITH BREAKFAST 90 tablet 1   glucose blood test strip Use as directed to monitor FSBS 2x daily. Dx E11.9. 100 each 12   hydrochlorothiazide (MICROZIDE) 12.5 MG capsule TAKE 1 CAPSULE(12.5 MG) BY MOUTH DAILY 90 capsule 3   hydrOXYzine (ATARAX/VISTARIL) 25 MG tablet TAKE 1 TABLET(25 MG) BY MOUTH AT BEDTIME AS NEEDED 90 tablet 1   Lancets (ONETOUCH ULTRASOFT) lancets Use as directed to monitor FSBS 2x daily. Dx E11.9. 100 each 12   lisinopril (ZESTRIL) 40 MG tablet TAKE 1 TABLET(40 MG) BY MOUTH DAILY  90 tablet 0   sildenafil (VIAGRA) 100 MG tablet Take 1 tablet (100 mg total) by mouth daily as needed for erectile dysfunction. 30 tablet 3   triamcinolone cream (KENALOG) 0.1 % Apply 1 application topically 2 (two) times daily. 30 g 0   Current Facility-Administered Medications on File Prior  to Visit  Medication Dose Route Frequency Provider Last Rate Last Admin   ipratropium-albuterol (DUONEB) 0.5-2.5 (3) MG/3ML nebulizer solution 3 mL  3 mL Nebulization Once Delsa Grana, PA-C       No Known Allergies Social History   Socioeconomic History   Marital status: Married    Spouse name: Not on file   Number of children: Not on file   Years of education: Not on file   Highest education level: Not on file  Occupational History   Not on file  Tobacco Use   Smoking status: Former    Packs/day: 2.00    Years: 10.00    Pack years: 20.00    Types: Cigarettes   Smokeless tobacco: Former    Types: Chew   Tobacco comments:    11/28/2011 "quit smoking ~ 30 yr ago; chewed some then quit that"  Substance and Sexual Activity   Alcohol use: Yes    Alcohol/week: 6.0 standard drinks    Types: 6 Cans of beer per week    Comment: 11/28/2011 "drink a few beers 3 times/wk; ~ 6pk total/wk"   Drug use: No   Sexual activity: Yes  Other Topics Concern   Not on file  Social History Narrative   Not on file   Social Determinants of Health   Financial Resource Strain: Low Risk    Difficulty of Paying Living Expenses: Not hard at all  Food Insecurity: No Food Insecurity   Worried About Charity fundraiser in the Last Year: Never true   [place] in the Last Year: Never true  Transportation Needs: No Transportation Needs   Lack of Transportation (Medical): No   Lack of Transportation (Non-Medical): No  Physical Activity: Inactive   Days of Exercise per Week: 0 days   Minutes of Exercise per Session: 0 min  Stress: No Stress Concern Present   Feeling of Stress : Not at all  Social  Connections: Moderately Isolated   Frequency of Communication with Friends and Family: Twice a week   Frequency of Social Gatherings with Friends and Family: Once a week   Attends Religious Services: Never   Marine scientist or Organizations: No   Attends Music therapist: Never   Marital Status: Married  Human resources officer Violence: Not At Risk   Fear of Current or Ex-Partner: No   Emotionally Abused: No   Physically Abused: No   Sexually Abused: No     Review of Systems     Objective:   Physical Exam Vitals reviewed.  Cardiovascular:     Rate and Rhythm: Normal rate and regular rhythm.     Heart sounds: Normal heart sounds. No murmur heard. Pulmonary:     Effort: Pulmonary effort is normal. No respiratory distress.     Breath sounds: Normal breath sounds. No stridor.          Assessment & Plan:  Stage 3 chronic kidney disease, unspecified whether stage 3a or 3b CKD ([place])  Controlled type 2 diabetes mellitus with complication, without long-term current use of insulin (HCC)  Benign essential HTN Patient has done an outstanding job.  I am very proud of him.  I congratulated him on his hard work.  The only change I would make at this point is to reduce his glipizide by 50% to avoid hypoglycemia.  Recheck all fasting lab work in 3 to 4 months at his convenience.  Keep an eye on his sugars now that were reducing  his glipizide.  Blood pressure is outstanding.  Liver function test have normalized.  Creatinine remained stable.  Continue Wilder Glade

## 2020-09-08 DIAGNOSIS — Z20822 Contact with and (suspected) exposure to covid-19: Secondary | ICD-10-CM | POA: Diagnosis not present

## 2020-10-13 DIAGNOSIS — R42 Dizziness and giddiness: Secondary | ICD-10-CM | POA: Diagnosis not present

## 2020-10-13 DIAGNOSIS — H9313 Tinnitus, bilateral: Secondary | ICD-10-CM | POA: Diagnosis not present

## 2020-10-13 DIAGNOSIS — H838X3 Other specified diseases of inner ear, bilateral: Secondary | ICD-10-CM | POA: Diagnosis not present

## 2020-10-13 DIAGNOSIS — H903 Sensorineural hearing loss, bilateral: Secondary | ICD-10-CM | POA: Diagnosis not present

## 2020-10-25 DIAGNOSIS — Z23 Encounter for immunization: Secondary | ICD-10-CM | POA: Diagnosis not present

## 2020-10-26 ENCOUNTER — Other Ambulatory Visit: Payer: Self-pay

## 2020-10-26 ENCOUNTER — Encounter: Payer: Self-pay | Admitting: Family Medicine

## 2020-10-26 ENCOUNTER — Ambulatory Visit (INDEPENDENT_AMBULATORY_CARE_PROVIDER_SITE_OTHER): Payer: Medicare Other | Admitting: Family Medicine

## 2020-10-26 VITALS — BP 112/64 | HR 86 | Temp 98.3°F | Resp 16 | Ht 64.0 in | Wt 166.0 lb

## 2020-10-26 DIAGNOSIS — L282 Other prurigo: Secondary | ICD-10-CM

## 2020-10-26 MED ORDER — MOMETASONE FUROATE 0.1 % EX CREA: TOPICAL_CREAM | Freq: Every day | CUTANEOUS | 1 refills | Status: DC

## 2020-10-26 MED ORDER — PERMETHRIN 5 % EX CREA: 1.0000 "application " | TOPICAL_CREAM | Freq: Once | CUTANEOUS | 0 refills | Status: AC

## 2020-10-26 NOTE — Progress Notes (Signed)
Subjective:    Patient ID: Bradley Baldwin, male    DOB: 12/29/1950, 70 y.o.   MRN: [id number]  HPI Patient presents with a 2-day history of erythematous papules on both ankles both lower legs on both gluteal cheeks and around his waist.  They itch.  They appear to be papular urticaria due to insect bites.  They appear to be due to either flea bites or mosquito bites.  Patient has no idea when he was exposed.  He typically wears jeans and compression hose.  I am assuming it occurred while he was wearing shorts 1 evening perhaps walking to take trash out.  He states that it itches.  He denies any rash on his upper torso arms or face.  He denies any lip swelling or tongue swelling.  He recently had a COVID shot right after the bites appeared so is not in relationship to that however this prevents [place] from using prednisone  Past Medical History:  Diagnosis Date   Arthritis    "starting to get achy joints here and there" (11/28/2011)   CKD (chronic kidney disease), stage III (HCC)    proteinuria 750 mg a day   Colon polyps    Diabetes mellitus without complication ([place])    External bleeding hemorrhoids    "occasionally" (11/28/2011)   Fatty liver disease, nonalcoholic    GERD (gastroesophageal reflux disease)    Hypertension     Past Surgical History:  Procedure Laterality Date   TOTAL HIP ARTHROPLASTY  11/28/2011   right   TOTAL HIP ARTHROPLASTY  11/28/2011   Procedure: TOTAL HIP ARTHROPLASTY ANTERIOR APPROACH;  Surgeon: Hessie Dibble, MD;  Location: [employer] [facility];  Service: Orthopedics;  Laterality: Right;   Current Outpatient Medications on File Prior to Visit  Medication Sig Dispense Refill   amLODipine (NORVASC) 10 MG tablet TAKE 1 TABLET(10 MG) BY MOUTH DAILY 90 tablet 1   aspirin EC 81 MG tablet Take 81 mg by mouth daily.     atenolol (TENORMIN) 50 MG tablet TAKE 1 TABLET(50 MG) BY MOUTH DAILY 90 tablet 1   atorvastatin (LIPITOR) 20 MG tablet TAKE 1 TABLET(20 MG) BY MOUTH DAILY 90  tablet 3   Cetirizine HCl (ZYRTEC ALLERGY) 10 MG TBDP Take 10 mg by mouth at bedtime. 30 tablet 0   dapagliflozin propanediol (FARXIGA) 10 MG TABS tablet Take 1 tablet (10 mg total) by mouth daily before breakfast. 90 tablet 3   fluticasone (FLONASE) 50 MCG/ACT nasal spray Place 2 sprays into the nose daily. (Patient taking differently: Place 2 sprays into the nose daily. PRN) 16 g 6   glipiZIDE (GLUCOTROL XL) 10 MG 24 hr tablet TAKE 1 TABLET(10 MG) BY MOUTH DAILY WITH BREAKFAST 90 tablet 1   glucose blood test strip Use as directed to monitor FSBS 2x daily. Dx E11.9. 100 each 12   hydrochlorothiazide (MICROZIDE) 12.5 MG capsule TAKE 1 CAPSULE(12.5 MG) BY MOUTH DAILY 90 capsule 3   hydrOXYzine (ATARAX/VISTARIL) 25 MG tablet TAKE 1 TABLET(25 MG) BY MOUTH AT BEDTIME AS NEEDED 90 tablet 1   Lancets (ONETOUCH ULTRASOFT) lancets Use as directed to monitor FSBS 2x daily. Dx E11.9. 100 each 12   lisinopril (ZESTRIL) 40 MG tablet TAKE 1 TABLET(40 MG) BY MOUTH DAILY 90 tablet 0   sildenafil (VIAGRA) 100 MG tablet Take 1 tablet (100 mg total) by mouth daily as needed for erectile dysfunction. 30 tablet 3   triamcinolone cream (KENALOG) 0.1 % Apply 1 application topically 2 (two) times daily.  30 g 0   Current Facility-Administered Medications on File Prior to Visit  Medication Dose Route Frequency Provider Last Rate Last Admin   ipratropium-albuterol (DUONEB) 0.5-2.5 (3) MG/3ML nebulizer solution 3 mL  3 mL Nebulization Once Delsa Grana, PA-C       No Known Allergies Social History   Socioeconomic History   Marital status: Married    Spouse name: Not on file   Number of children: Not on file   Years of education: Not on file   Highest education level: Not on file  Occupational History   Not on file  Tobacco Use   Smoking status: Former    Packs/day: 2.00    Years: 10.00    Pack years: 20.00    Types: Cigarettes   Smokeless tobacco: Former    Types: Chew   Tobacco comments:    11/28/2011  "quit smoking ~ 30 yr ago; chewed some then quit that"  Substance and Sexual Activity   Alcohol use: Yes    Alcohol/week: 6.0 standard drinks    Types: 6 Cans of beer per week    Comment: 11/28/2011 "drink a few beers 3 times/wk; ~ 6pk total/wk"   Drug use: No   Sexual activity: Yes  Other Topics Concern   Not on file  Social History Narrative   Not on file   Social Determinants of Health   Financial Resource Strain: Low Risk    Difficulty of Paying Living Expenses: Not hard at all  Food Insecurity: No Food Insecurity   Worried About Charity fundraiser in the Last Year: Never true   [place] in the Last Year: Never true  Transportation Needs: No Transportation Needs   Lack of Transportation (Medical): No   Lack of Transportation (Non-Medical): No  Physical Activity: Inactive   Days of Exercise per Week: 0 days   Minutes of Exercise per Session: 0 min  Stress: No Stress Concern Present   Feeling of Stress : Not at all  Social Connections: Moderately Isolated   Frequency of Communication with Friends and Family: Twice a week   Frequency of Social Gatherings with Friends and Family: Once a week   Attends Religious Services: Never   Marine scientist or Organizations: No   Attends Music therapist: Never   Marital Status: Married  Human resources officer Violence: Not At Risk   Fear of Current or Ex-Partner: No   Emotionally Abused: No   Physically Abused: No   Sexually Abused: No     Review of Systems  All other systems reviewed and are negative.     Objective:   Physical Exam Vitals reviewed.  Constitutional:      General: He is not in acute distress.    Appearance: Normal appearance. He is well-developed and normal weight. He is not ill-appearing, toxic-appearing or diaphoretic.  HENT:     Head: Normocephalic and atraumatic.  Neck:     Vascular: No carotid bruit.  Cardiovascular:     Rate and Rhythm: Normal rate and regular rhythm.      Pulses: Normal pulses.     Heart sounds: Normal heart sounds. No murmur heard.   No friction rub. No gallop.  Pulmonary:     Effort: Pulmonary effort is normal. No respiratory distress.     Breath sounds: Normal breath sounds. No stridor. No wheezing, rhonchi or rales.  Chest:     Chest wall: No tenderness.  Lymphadenopathy:     Cervical: No  cervical adenopathy.  Skin:    Findings: Rash present. Rash is papular. Rash is not crusting, macular, nodular, purpuric, pustular, scaling, urticarial or vesicular.       Neurological:     General: No focal deficit present.     Mental Status: He is alert and oriented to person, place, and time. Mental status is at baseline.     Cranial Nerves: No cranial nerve deficit.     Motor: No weakness.     Coordination: Coordination normal.     Gait: Gait normal.          Assessment & Plan:  Papular urticaria Suspect papular urticaria due to insect bites.  I suspect that these are likely mosquito bites.  Recommended mometasone cream be applied 1-2 times daily as needed.  Anticipate self-limited resolution within 1 week.  If he develops a rash on his face mouth etc., I would reconsider this as a possible allergic reaction.  Does not appear to be a viral exanthem.  All the papules are in the same state of development.  They are 3 mm erythematous well-defined papules

## 2020-11-17 ENCOUNTER — Other Ambulatory Visit: Payer: Self-pay | Admitting: Family Medicine

## 2020-11-19 ENCOUNTER — Other Ambulatory Visit: Payer: Self-pay | Admitting: Family Medicine

## 2020-11-19 ENCOUNTER — Telehealth: Payer: Self-pay | Admitting: *Deleted

## 2020-11-19 NOTE — Telephone Encounter (Signed)
Received request from pharmacy for PA on Hydroxyzine.   PA submitted.   Dx: L28.2- urticaria.   [facility] has not yet replied to your PA request. You may close this dialog, return to your dashboard, and perform other tasks.  To check for an update later, open this request again from your dashboard.  If [facility] has not replied to your request within 24 hours for urgent requests or 72 hours for standard requests, please reach out to the plan using the phone number located on the back on the [employer] card.

## 2020-11-19 NOTE — Telephone Encounter (Signed)
Received PA determination.   PA approved.   This drug has been approved under the Member's Medicare Part D benefit. Approved quantity: 90 units per 90 day(s). You may fill up to a 90 day supply except for those on Specialty Tier 5, which can be filled up to a 30 day supply. Please call the pharmacy to process the prescription claim.

## 2020-11-22 ENCOUNTER — Other Ambulatory Visit: Payer: Self-pay

## 2020-11-22 ENCOUNTER — Ambulatory Visit (INDEPENDENT_AMBULATORY_CARE_PROVIDER_SITE_OTHER): Payer: Medicare Other | Admitting: *Deleted

## 2020-11-22 DIAGNOSIS — Z23 Encounter for immunization: Secondary | ICD-10-CM | POA: Diagnosis not present

## 2020-11-24 ENCOUNTER — Other Ambulatory Visit: Payer: Self-pay | Admitting: Family Medicine

## 2021-01-04 DIAGNOSIS — Z1211 Encounter for screening for malignant neoplasm of colon: Secondary | ICD-10-CM | POA: Diagnosis not present

## 2021-01-04 DIAGNOSIS — Z86010 Personal history of colonic polyps: Secondary | ICD-10-CM | POA: Diagnosis not present

## 2021-01-04 DIAGNOSIS — K648 Other hemorrhoids: Secondary | ICD-10-CM | POA: Diagnosis not present

## 2021-01-04 DIAGNOSIS — K5730 Diverticulosis of large intestine without perforation or abscess without bleeding: Secondary | ICD-10-CM | POA: Diagnosis not present

## 2021-01-07 ENCOUNTER — Telehealth: Payer: Self-pay | Admitting: *Deleted

## 2021-01-07 NOTE — Chronic Care Management (AMB) (Signed)
  Chronic Care Management   Note  01/07/2021 Name: Bradley Baldwin MRN: [id number] DOB: [birth year]  Bradley Baldwin is a 70 y.o. year old male who is a primary care patient of Dennard Schaumann, Cammie Mcgee, MD. I reached out to Bradley Baldwin by phone today in response to a referral sent by Bradley Baldwin PCP.  Bradley Baldwin was given information about Chronic Care Management services today including:  CCM service includes personalized support from designated clinical staff supervised by his physician, including individualized plan of care and coordination with other care providers 24/7 contact phone numbers for assistance for urgent and routine care needs. Service will only be billed when office clinical staff spend 20 minutes or more in a month to coordinate care. Only one practitioner may furnish and bill the service in a calendar month. The patient may stop CCM services at any time (effective at the end of the month) by phone call to the office staff. The patient is responsible for co-pay (up to 20% after annual deductible is met) if co-pay is required by the individual health plan.   Patient did not agree to enrollment in care management services and does not wish to consider at this time.  Follow up plan: Patient declines engagement by the care management team. Appropriate care team members and provider have been notified via electronic communication. The care management team is available to follow up with the patient after provider conversation with the patient regarding recommendation for care management engagement and subsequent re-referral to the care management team.   [facility]  Direct Dial: [phone]

## 2021-01-16 ENCOUNTER — Encounter: Payer: Self-pay | Admitting: Family Medicine

## 2021-01-17 ENCOUNTER — Other Ambulatory Visit: Payer: Self-pay

## 2021-01-17 ENCOUNTER — Other Ambulatory Visit: Payer: Medicare Other

## 2021-01-17 DIAGNOSIS — E118 Type 2 diabetes mellitus with unspecified complications: Secondary | ICD-10-CM

## 2021-01-17 DIAGNOSIS — L282 Other prurigo: Secondary | ICD-10-CM

## 2021-01-17 DIAGNOSIS — E781 Pure hyperglyceridemia: Secondary | ICD-10-CM

## 2021-01-17 DIAGNOSIS — N183 Chronic kidney disease, stage 3 unspecified: Secondary | ICD-10-CM

## 2021-01-18 ENCOUNTER — Other Ambulatory Visit: Payer: Self-pay | Admitting: Family Medicine

## 2021-01-18 ENCOUNTER — Other Ambulatory Visit: Payer: Self-pay

## 2021-01-18 ENCOUNTER — Encounter: Payer: Self-pay | Admitting: Family Medicine

## 2021-01-18 DIAGNOSIS — E1165 Type 2 diabetes mellitus with hyperglycemia: Secondary | ICD-10-CM

## 2021-01-18 LAB — COMPREHENSIVE METABOLIC PANEL
AG Ratio: 1.8 (calc) (ref 1.0–2.5)
ALT: 52 U/L — ABNORMAL HIGH (ref 9–46)
AST: 50 U/L — ABNORMAL HIGH (ref 10–35)
Albumin: 4.4 g/dL (ref 3.6–5.1)
Alkaline phosphatase (APISO): 72 U/L (ref 35–144)
BUN/Creatinine Ratio: 25 (calc) — ABNORMAL HIGH (ref 6–22)
BUN: 39 mg/dL — ABNORMAL HIGH (ref 7–25)
CO2: 23 mmol/L (ref 20–32)
Calcium: 9.6 mg/dL (ref 8.6–10.3)
Chloride: 105 mmol/L (ref 98–110)
Creat: 1.58 mg/dL — ABNORMAL HIGH (ref 0.70–1.28)
Globulin: 2.5 g/dL (calc) (ref 1.9–3.7)
Glucose, Bld: 119 mg/dL — ABNORMAL HIGH (ref 65–99)
Potassium: 5 mmol/L (ref 3.5–5.3)
Sodium: 139 mmol/L (ref 135–146)
Total Bilirubin: 0.8 mg/dL (ref 0.2–1.2)
Total Protein: 6.9 g/dL (ref 6.1–8.1)

## 2021-01-18 LAB — HEMOGLOBIN A1C
Hgb A1c MFr Bld: 5.3 % of total Hgb (ref <5.7)
Mean Plasma Glucose: 105 mg/dL
eAG (mmol/L): 5.8 mmol/L

## 2021-01-18 LAB — CBC WITH DIFFERENTIAL/PLATELET
Absolute Monocytes: 713 cells/uL (ref 200–950)
Basophils Absolute: 81 cells/uL (ref 0–200)
Basophils Relative: 1 %
Eosinophils Absolute: 348 cells/uL (ref 15–500)
Eosinophils Relative: 4.3 %
HCT: 40.5 % (ref 38.5–50.0)
Hemoglobin: 13.9 g/dL (ref 13.2–17.1)
Lymphs Abs: 3929 cells/uL — ABNORMAL HIGH (ref 850–3900)
MCH: 33.4 pg — ABNORMAL HIGH (ref 27.0–33.0)
MCHC: 34.3 g/dL (ref 32.0–36.0)
MCV: 97.4 fL (ref 80.0–100.0)
MPV: 10.1 fL (ref 7.5–12.5)
Monocytes Relative: 8.8 %
Neutro Abs: 3029 cells/uL (ref 1500–7800)
Neutrophils Relative %: 37.4 %
Platelets: 204 10*3/uL (ref 140–400)
RBC: 4.16 10*6/uL — ABNORMAL LOW (ref 4.20–5.80)
RDW: 11.6 % (ref 11.0–15.0)
Total Lymphocyte: 48.5 %
WBC: 8.1 10*3/uL (ref 3.8–10.8)

## 2021-01-18 LAB — LIPID PANEL
Cholesterol: 145 mg/dL (ref <200)
HDL: 60 mg/dL (ref >=40)
LDL Cholesterol (Calc): 65 mg/dL (calc)
Non-HDL Cholesterol (Calc): 85 mg/dL (calc) (ref <130)
Total CHOL/HDL Ratio: 2.4 (calc) (ref <5.0)
Triglycerides: 123 mg/dL (ref <150)

## 2021-02-15 ENCOUNTER — Encounter: Payer: Self-pay | Admitting: Family Medicine

## 2021-02-15 ENCOUNTER — Other Ambulatory Visit: Payer: Self-pay

## 2021-02-15 ENCOUNTER — Ambulatory Visit: Payer: Medicare Other | Admitting: Family Medicine

## 2021-02-15 ENCOUNTER — Ambulatory Visit (INDEPENDENT_AMBULATORY_CARE_PROVIDER_SITE_OTHER): Payer: Medicare Other | Admitting: Family Medicine

## 2021-02-15 VITALS — BP 132/68 | HR 62 | Temp 97.9°F | Resp 18 | Ht 64.0 in | Wt 166.0 lb

## 2021-02-15 DIAGNOSIS — D485 Neoplasm of uncertain behavior of skin: Secondary | ICD-10-CM

## 2021-02-15 DIAGNOSIS — L570 Actinic keratosis: Secondary | ICD-10-CM | POA: Diagnosis not present

## 2021-02-15 DIAGNOSIS — L989 Disorder of the skin and subcutaneous tissue, unspecified: Secondary | ICD-10-CM | POA: Diagnosis not present

## 2021-02-15 NOTE — Progress Notes (Signed)
Subjective:    Patient ID: Bradley Baldwin, male    DOB: 01/22/1951, 71 y.o.   MRN: [id number]  HPI Patient reports a lesion on his lateral right upper arm that has been there for more than a month.  It is a red scaly flat papule.  It is well-circumscribed.  It is about 1.3 cm in diameter.  There is telangiectasias on the surface.  He states that he can scratch them and will feel off and then return.  Past Medical History:  Diagnosis Date   Arthritis    "starting to get achy joints here and there" (11/28/2011)   CKD (chronic kidney disease), stage III (HCC)    proteinuria 750 mg a day   Colon polyps    Diabetes mellitus without complication ([place])    External bleeding hemorrhoids    "occasionally" (11/28/2011)   Fatty liver disease, nonalcoholic    GERD (gastroesophageal reflux disease)    Hypertension     Past Surgical History:  Procedure Laterality Date   TOTAL HIP ARTHROPLASTY  11/28/2011   right   TOTAL HIP ARTHROPLASTY  11/28/2011   Procedure: TOTAL HIP ARTHROPLASTY ANTERIOR APPROACH;  Surgeon: Hessie Dibble, MD;  Location: [place];  Service: Orthopedics;  Laterality: Right;   Current Outpatient Medications on File Prior to Visit  Medication Sig Dispense Refill   amLODipine (NORVASC) 10 MG tablet TAKE 1 TABLET(10 MG) BY MOUTH DAILY 90 tablet 1   aspirin EC 81 MG tablet Take 81 mg by mouth daily.     atenolol (TENORMIN) 50 MG tablet TAKE 1 TABLET(50 MG) BY MOUTH DAILY 90 tablet 1   atorvastatin (LIPITOR) 20 MG tablet TAKE 1 TABLET(20 MG) BY MOUTH DAILY 90 tablet 3   Cetirizine HCl (ZYRTEC ALLERGY) 10 MG TBDP Take 10 mg by mouth at bedtime. 30 tablet 0   dapagliflozin propanediol (FARXIGA) 10 MG TABS tablet Take 1 tablet (10 mg total) by mouth daily before breakfast. 90 tablet 3   fluticasone (FLONASE) 50 MCG/ACT nasal spray Place 2 sprays into the nose daily. (Patient taking differently: Place 2 sprays into the nose daily. PRN) 16 g 6   glipiZIDE (GLUCOTROL XL) 10 MG 24 hr  tablet TAKE 1 TABLET(10 MG) BY MOUTH DAILY WITH BREAKFAST 90 tablet 3   glucose blood test strip Use as directed to monitor FSBS 2x daily. Dx E11.9. 100 each 12   hydrochlorothiazide (MICROZIDE) 12.5 MG capsule TAKE 1 CAPSULE(12.5 MG) BY MOUTH DAILY 90 capsule 3   hydrOXYzine (ATARAX/VISTARIL) 25 MG tablet TAKE 1 TABLET(25 MG) BY MOUTH AT BEDTIME AS NEEDED 90 tablet 1   Lancets (ONETOUCH ULTRASOFT) lancets Use as directed to monitor FSBS 2x daily. Dx E11.9. 100 each 12   lisinopril (ZESTRIL) 40 MG tablet TAKE 1 TABLET(40 MG) BY MOUTH DAILY 90 tablet 0   mometasone (ELOCON) 0.1 % cream Apply topically daily. 45 g 1   sildenafil (VIAGRA) 100 MG tablet Take 1 tablet (100 mg total) by mouth daily as needed for erectile dysfunction. 30 tablet 3   triamcinolone cream (KENALOG) 0.1 % Apply 1 application topically 2 (two) times daily. 30 g 0   No current facility-administered medications on file prior to visit.   No Known Allergies Social History   Socioeconomic History   Marital status: Married    Spouse name: Not on file   Number of children: Not on file   Years of education: Not on file   Highest education level: Not on file  Occupational History  Not on file  Tobacco Use   Smoking status: Former    Packs/day: 2.00    Years: 10.00    Pack years: 20.00    Types: Cigarettes   Smokeless tobacco: Former    Types: Chew   Tobacco comments:    11/28/2011 "quit smoking ~ 30 yr ago; chewed some then quit that"  Substance and Sexual Activity   Alcohol use: Yes    Alcohol/week: 6.0 standard drinks    Types: 6 Cans of beer per week    Comment: 11/28/2011 "drink a few beers 3 times/wk; ~ 6pk total/wk"   Drug use: No   Sexual activity: Yes  Other Topics Concern   Not on file  Social History Narrative   Not on file   Social Determinants of Health   Financial Resource Strain: Low Risk    Difficulty of Paying Living Expenses: Not hard at all  Food Insecurity: No Food Insecurity   Worried  About Charity fundraiser in the Last Year: Never true   [place] in the Last Year: Never true  Transportation Needs: No Transportation Needs   Lack of Transportation (Medical): No   Lack of Transportation (Non-Medical): No  Physical Activity: Inactive   Days of Exercise per Week: 0 days   Minutes of Exercise per Session: 0 min  Stress: No Stress Concern Present   Feeling of Stress : Not at all  Social Connections: Moderately Isolated   Frequency of Communication with Friends and Family: Twice a week   Frequency of Social Gatherings with Friends and Family: Once a week   Attends Religious Services: Never   Marine scientist or Organizations: No   Attends Music therapist: Never   Marital Status: Married  Human resources officer Violence: Not At Risk   Fear of Current or Ex-Partner: No   Emotionally Abused: No   Physically Abused: No   Sexually Abused: No     Review of Systems  All other systems reviewed and are negative.     Objective:   Physical Exam Vitals reviewed.  Constitutional:      General: He is not in acute distress.    Appearance: Normal appearance. He is well-developed and normal weight. He is not ill-appearing, toxic-appearing or diaphoretic.  Neck:     Vascular: No carotid bruit.  Cardiovascular:     Rate and Rhythm: Normal rate and regular rhythm.     Pulses: Normal pulses.     Heart sounds: Normal heart sounds. No murmur heard.   No friction rub. No gallop.  Pulmonary:     Effort: Pulmonary effort is normal. No respiratory distress.     Breath sounds: Normal breath sounds. No stridor. No wheezing, rhonchi or rales.  Chest:     Chest wall: No tenderness.  Musculoskeletal:       Arms:  Lymphadenopathy:     Cervical: No cervical adenopathy.  Skin:    Findings: Lesion present.  Neurological:     Mental Status: He is alert.          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  Recommended a shave biopsy.  Using sterile technique, I anesthetized the lesion with 0.1% lidocaine with epinephrine.  I then performed a shave biopsy using a razor blade down to the underlying dermis.  Lesion was sent to pathology in  a labeled container.  Hemostasis was achieved with Drysol and a Band-Aid.  Await results of the pathology report.

## 2021-02-18 LAB — PATHOLOGY REPORT

## 2021-02-18 LAB — TISSUE SPECIMEN

## 2021-03-02 DIAGNOSIS — M79671 Pain in right foot: Secondary | ICD-10-CM | POA: Diagnosis not present

## 2021-03-02 DIAGNOSIS — M79672 Pain in left foot: Secondary | ICD-10-CM | POA: Diagnosis not present

## 2021-03-08 ENCOUNTER — Other Ambulatory Visit: Payer: Self-pay

## 2021-03-08 MED ORDER — GLUCOSE BLOOD VI STRP: ORAL_STRIP | 12 refills | Status: DC

## 2021-03-11 ENCOUNTER — Other Ambulatory Visit: Payer: Self-pay | Admitting: Family Medicine

## 2021-03-11 DIAGNOSIS — I10 Essential (primary) hypertension: Secondary | ICD-10-CM

## 2021-04-12 ENCOUNTER — Other Ambulatory Visit: Payer: Self-pay | Admitting: Family Medicine

## 2021-04-17 ENCOUNTER — Other Ambulatory Visit: Payer: Self-pay | Admitting: Family Medicine

## 2021-05-02 DIAGNOSIS — M25562 Pain in left knee: Secondary | ICD-10-CM | POA: Diagnosis not present

## 2021-06-01 DIAGNOSIS — Z20822 Contact with and (suspected) exposure to covid-19: Secondary | ICD-10-CM | POA: Diagnosis not present

## 2021-06-02 ENCOUNTER — Other Ambulatory Visit: Payer: Self-pay | Admitting: Family Medicine

## 2021-06-02 ENCOUNTER — Ambulatory Visit: Payer: Medicare Other

## 2021-06-02 DIAGNOSIS — R7303 Prediabetes: Secondary | ICD-10-CM | POA: Diagnosis not present

## 2021-06-02 DIAGNOSIS — Z0000 Encounter for general adult medical examination without abnormal findings: Secondary | ICD-10-CM

## 2021-06-02 DIAGNOSIS — E781 Pure hyperglyceridemia: Secondary | ICD-10-CM

## 2021-06-02 NOTE — Telephone Encounter (Signed)
Requested Prescriptions  ?Pending Prescriptions Disp Refills  ?? hydrochlorothiazide (MICROZIDE) 12.5 MG capsule [Pharmacy Med Name: HYDROCHLOROTHIAZIDE 12.'5MG'$  CAPSULES] 90 capsule 3  ?  Sig: TAKE 1 CAPSULE(12.5 MG) BY MOUTH DAILY  ?  ? Cardiovascular: Diuretics - Thiazide Failed - 06/02/2021  1:02 PM  ?  ?  Failed - Cr in normal range and within 180 days  ?  Creat  ?Date Value Ref Range Status  ?01/17/2021 1.58 (H) 0.70 - 1.28 mg/dL Final  ? ?Creatinine, Urine  ?Date Value Ref Range Status  ?06/16/2020 116 20 - 320 mg/dL Final  ?   ?  ?  Passed - K in normal range and within 180 days  ?  Potassium  ?Date Value Ref Range Status  ?01/17/2021 5.0 3.5 - 5.3 mmol/L Final  ?   ?  ?  Passed - Na in normal range and within 180 days  ?  Sodium  ?Date Value Ref Range Status  ?01/17/2021 139 135 - 146 mmol/L Final  ?   ?  ?  Passed - Last BP in normal range  ?  BP Readings from Last 1 Encounters:  ?02/15/21 132/68  ?   ?  ?  Passed - Valid encounter within last 6 months  ?  Recent Outpatient Visits   ?      ? Today Prediabetes  ? [place]  ? 3 months ago Skin lesion of right arm  ? [facility] [place], Cammie Mcgee, MD  ? 7 months ago Papular urticaria  ? [facility] Pick[place], Cammie Mcgee, MD  ? 8 months ago Stage 3 chronic kidney disease, unspecified whether stage 3a or 3b CKD ([place])  ? [facility] [place], Cammie Mcgee, MD  ? 11 months ago Stage 3 chronic kidney disease, unspecified whether stage 3a or 3b CKD ([place])  ? [facility] [place], Cammie Mcgee, MD  ?  ?  ? ?  ?  ?  ? ? ?

## 2021-06-03 LAB — CBC WITH DIFFERENTIAL/PLATELET
Absolute Monocytes: 585 cells/uL (ref 200–950)
Basophils Absolute: 59 cells/uL (ref 0–200)
Basophils Relative: 0.9 %
Eosinophils Absolute: 189 cells/uL (ref 15–500)
Eosinophils Relative: 2.9 %
HCT: 41.2 % (ref 38.5–50.0)
Hemoglobin: 14.3 g/dL (ref 13.2–17.1)
Lymphs Abs: 2613 cells/uL (ref 850–3900)
MCH: 33 pg (ref 27.0–33.0)
MCHC: 34.7 g/dL (ref 32.0–36.0)
MCV: 95.2 fL (ref 80.0–100.0)
MPV: 10.3 fL (ref 7.5–12.5)
Monocytes Relative: 9 %
Neutro Abs: 3055 cells/uL (ref 1500–7800)
Neutrophils Relative %: 47 %
Platelets: 193 10*3/uL (ref 140–400)
RBC: 4.33 10*6/uL (ref 4.20–5.80)
RDW: 11.3 % (ref 11.0–15.0)
Total Lymphocyte: 40.2 %
WBC: 6.5 10*3/uL (ref 3.8–10.8)

## 2021-06-03 LAB — COMPREHENSIVE METABOLIC PANEL
AG Ratio: 1.6 (calc) (ref 1.0–2.5)
ALT: 47 U/L — ABNORMAL HIGH (ref 9–46)
AST: 40 U/L — ABNORMAL HIGH (ref 10–35)
Albumin: 4.2 g/dL (ref 3.6–5.1)
Alkaline phosphatase (APISO): 74 U/L (ref 35–144)
BUN/Creatinine Ratio: 20 (calc) (ref 6–22)
BUN: 33 mg/dL — ABNORMAL HIGH (ref 7–25)
CO2: 25 mmol/L (ref 20–32)
Calcium: 9.8 mg/dL (ref 8.6–10.3)
Chloride: 103 mmol/L (ref 98–110)
Creat: 1.64 mg/dL — ABNORMAL HIGH (ref 0.70–1.28)
Globulin: 2.6 g/dL (calc) (ref 1.9–3.7)
Glucose, Bld: 156 mg/dL — ABNORMAL HIGH (ref 65–99)
Potassium: 5 mmol/L (ref 3.5–5.3)
Sodium: 136 mmol/L (ref 135–146)
Total Bilirubin: 0.8 mg/dL (ref 0.2–1.2)
Total Protein: 6.8 g/dL (ref 6.1–8.1)

## 2021-06-03 LAB — LIPID PANEL
Cholesterol: 120 mg/dL (ref <200)
HDL: 51 mg/dL (ref >=40)
LDL Cholesterol (Calc): 53 mg/dL (calc)
Non-HDL Cholesterol (Calc): 69 mg/dL (calc) (ref <130)
Total CHOL/HDL Ratio: 2.4 (calc) (ref <5.0)
Triglycerides: 84 mg/dL (ref <150)

## 2021-06-03 LAB — HEMOGLOBIN A1C
Hgb A1c MFr Bld: 5.3 % of total Hgb (ref <5.7)
Mean Plasma Glucose: 105 mg/dL
eAG (mmol/L): 5.8 mmol/L

## 2021-07-01 DIAGNOSIS — M1712 Unilateral primary osteoarthritis, left knee: Secondary | ICD-10-CM | POA: Diagnosis not present

## 2021-07-08 DIAGNOSIS — M1712 Unilateral primary osteoarthritis, left knee: Secondary | ICD-10-CM | POA: Diagnosis not present

## 2021-07-19 ENCOUNTER — Other Ambulatory Visit: Payer: Self-pay | Admitting: Family Medicine

## 2021-07-19 NOTE — Telephone Encounter (Signed)
Requested medication (s) are due for refill today: yes  Requested medication (s) are on the active medication list: yes  Last refill:  06/22/20 #30 3 RF  Future visit scheduled: no  Notes to clinic:  med not assigned to a protocol   Requested Prescriptions  Pending Prescriptions Disp Refills   sildenafil (VIAGRA) 100 MG tablet [Pharmacy Med Name: Sildenafil Citrate 100 MG Oral Tablet] 30 tablet 0    Sig: TAKE 1 TABLET BY MOUTH DAILY AS NEEDED FOR ERECTILE DYSFUNCTION     There is no refill protocol information for this order

## 2021-07-26 DIAGNOSIS — D2262 Melanocytic nevi of left upper limb, including shoulder: Secondary | ICD-10-CM | POA: Diagnosis not present

## 2021-07-26 DIAGNOSIS — L821 Other seborrheic keratosis: Secondary | ICD-10-CM | POA: Diagnosis not present

## 2021-07-26 DIAGNOSIS — D2261 Melanocytic nevi of right upper limb, including shoulder: Secondary | ICD-10-CM | POA: Diagnosis not present

## 2021-07-26 DIAGNOSIS — D225 Melanocytic nevi of trunk: Secondary | ICD-10-CM | POA: Diagnosis not present

## 2021-07-26 DIAGNOSIS — L570 Actinic keratosis: Secondary | ICD-10-CM | POA: Diagnosis not present

## 2021-07-27 ENCOUNTER — Encounter: Payer: Self-pay | Admitting: Family Medicine

## 2021-07-27 ENCOUNTER — Other Ambulatory Visit: Payer: Self-pay | Admitting: Family Medicine

## 2021-07-27 NOTE — Telephone Encounter (Signed)
Duplicate, pharm and pt aware. Requested Prescriptions  Pending Prescriptions Disp Refills  . sildenafil (VIAGRA) 100 MG tablet [Pharmacy Med Name: Sildenafil Citrate 100 MG Oral Tablet] 30 tablet 0    Sig: TAKE 1 TABLET BY MOUTH DAILY AS NEEDED FOR ERECTILE DYSFUNCTION     Urology: Erectile Dysfunction Agents Failed - 07/27/2021  5:13 PM      Failed - AST in normal range and within 360 days    AST  Date Value Ref Range Status  06/02/2021 40 (H) 10 - 35 U/L Final         Failed - ALT in normal range and within 360 days    ALT  Date Value Ref Range Status  06/02/2021 47 (H) 9 - 46 U/L Final         Passed - Last BP in normal range    BP Readings from Last 1 Encounters:  02/15/21 132/68         Passed - Valid encounter within last 12 months    Recent Outpatient Visits          1 month ago Prediabetes   [place]   5 months ago Skin lesion of right arm   [place] Pickard, Cammie Mcgee, MD   9 months ago Papular urticaria   [place], Cammie Mcgee, MD   10 months ago Stage 3 chronic kidney disease, unspecified whether stage 3a or 3b CKD ([place])   [place] Susy Frizzle, MD   1 year ago Stage 3 chronic kidney disease, unspecified whether stage 3a or 3b CKD ([place])   [facility] [place], Cammie Mcgee, MD

## 2021-07-27 NOTE — Telephone Encounter (Signed)
Requested Prescriptions  Pending Prescriptions Disp Refills  . Lancets (ONETOUCH DELICA PLUS [id number]) [place] [Pharmacy Med Name: ONE TOUCH DELICA PLUS 62X LANCETS] 100 each 12    Sig: USE AS DIRECTED TO MONITOR FASTING BLOOD SUGAR TWICE DAILY     Endocrinology: Diabetes - Testing Supplies Passed - 07/27/2021  2:34 PM      Passed - Valid encounter within last 12 months    Recent Outpatient Visits          1 month ago Prediabetes   [place]   5 months ago Skin lesion of right arm   [place] Pickard, Cammie Mcgee, MD   9 months ago Papular urticaria   [place] Dennard Schaumann, Cammie Mcgee, MD   10 months ago Stage 3 chronic kidney disease, unspecified whether stage 3a or 3b CKD ([place])   [facility] Susy Frizzle, MD   1 year ago Stage 3 chronic kidney disease, unspecified whether stage 3a or 3b CKD ([place])   [facility] [place], Cammie Mcgee, MD

## 2021-07-28 ENCOUNTER — Encounter: Payer: Self-pay | Admitting: Family Medicine

## 2021-07-28 MED ORDER — SILDENAFIL CITRATE 100 MG PO TABS: ORAL_TABLET | ORAL | 0 refills | Status: DC

## 2021-07-28 NOTE — Telephone Encounter (Signed)
Spoke with pt and pt was able to pick his meds today. Nothing else is needed

## 2021-07-28 NOTE — Addendum Note (Signed)
Addended by: Colman Cater on: 07/28/2021 04:51 PM   Modules accepted: Orders

## 2021-08-08 ENCOUNTER — Other Ambulatory Visit: Payer: Self-pay | Admitting: Family Medicine

## 2021-08-23 IMAGING — US US RENAL
1 series · 14 of 25 positions shown · non-contrast
Comparison: 11/02/2011

CLINICAL DATA: Hypertension, abnormal renal function test

EXAM:
RENAL / URINARY TRACT ULTRASOUND COMPLETE

[Series 1: us renal · 0.23mm/px · 14 of 39 slices shown]
[im 1/39]
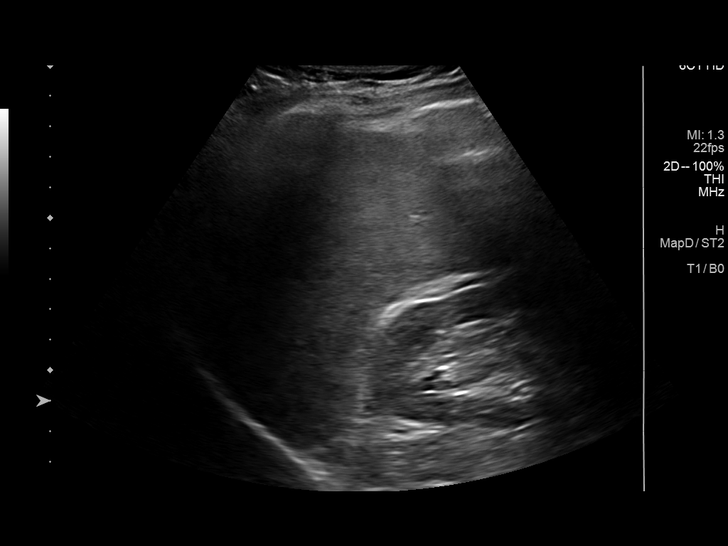
[im 4/39]
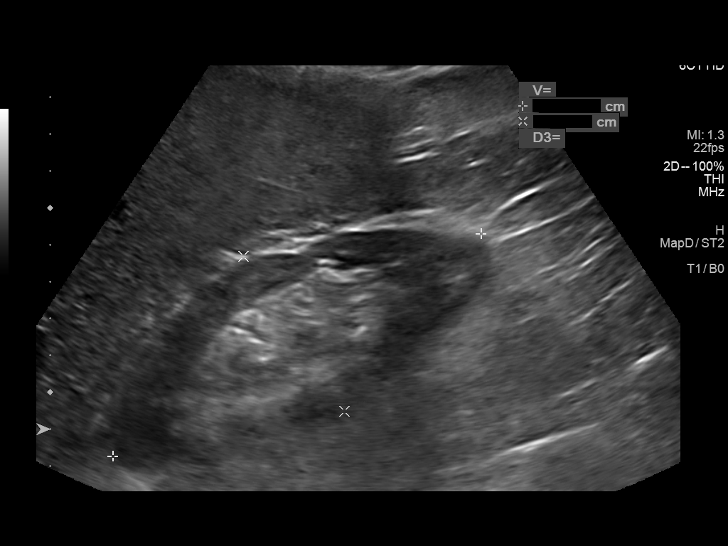
[im 7/39]
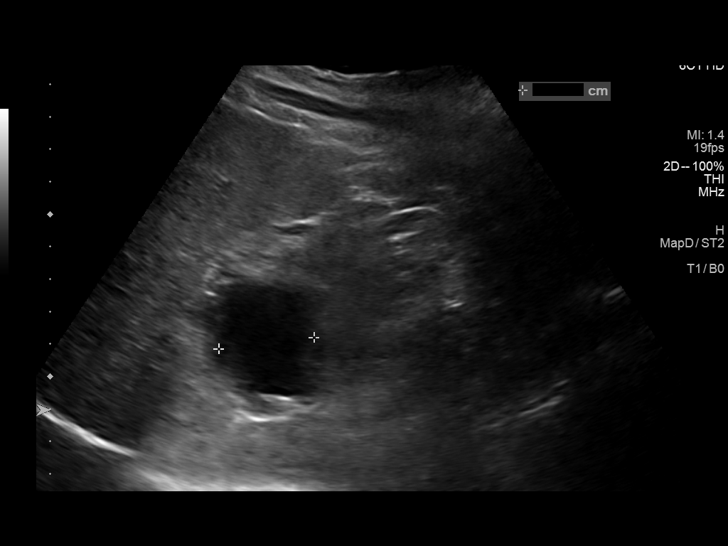
[im 10/39]
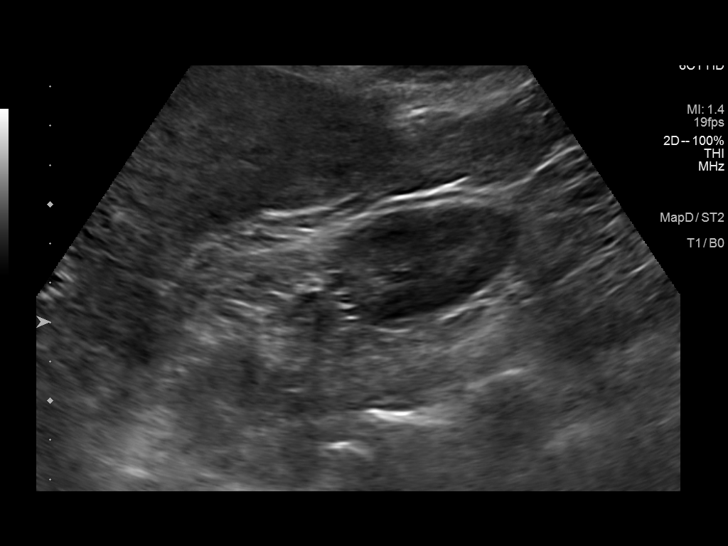
[im 13/39]
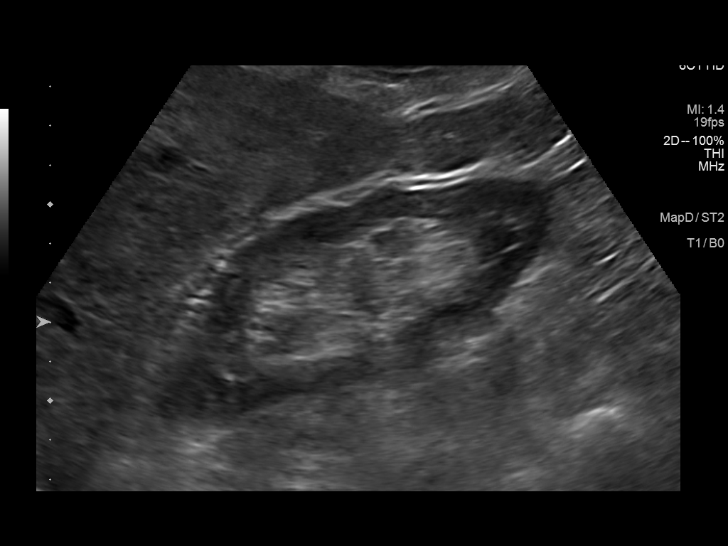
[im 15/39]
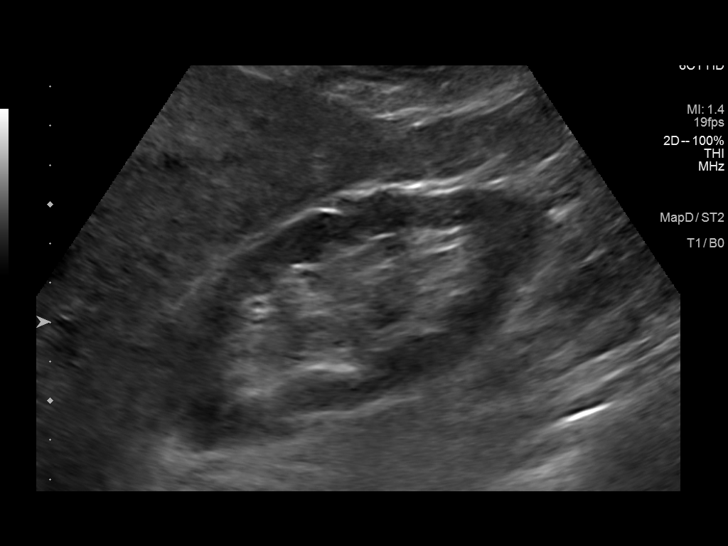
[im 18/39]
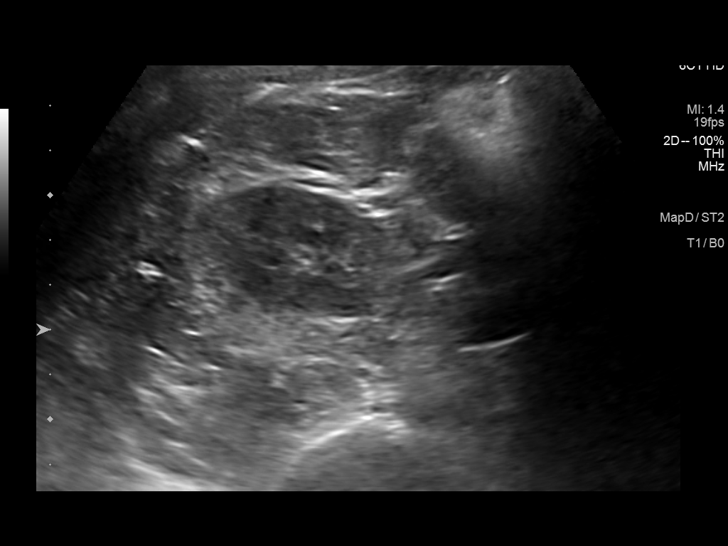
[im 21/39]
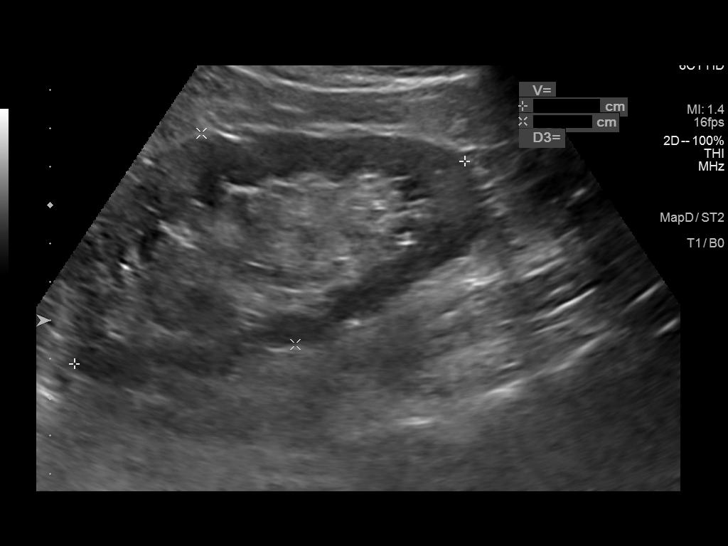
[im 24/39]
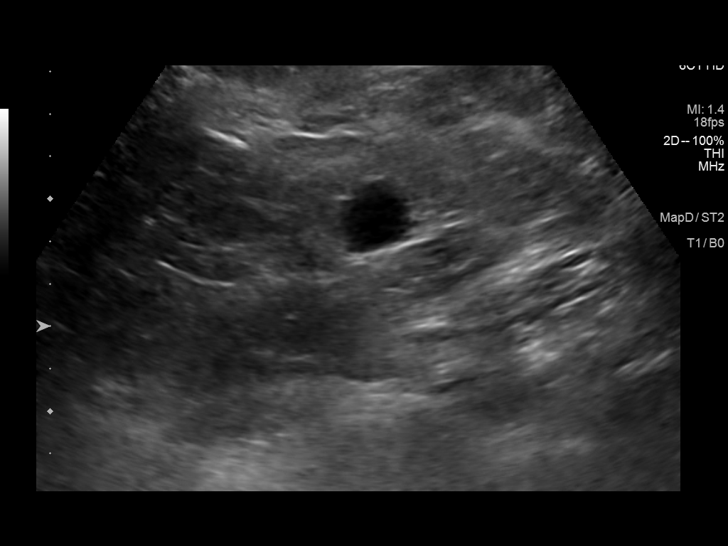
[im 26/39]
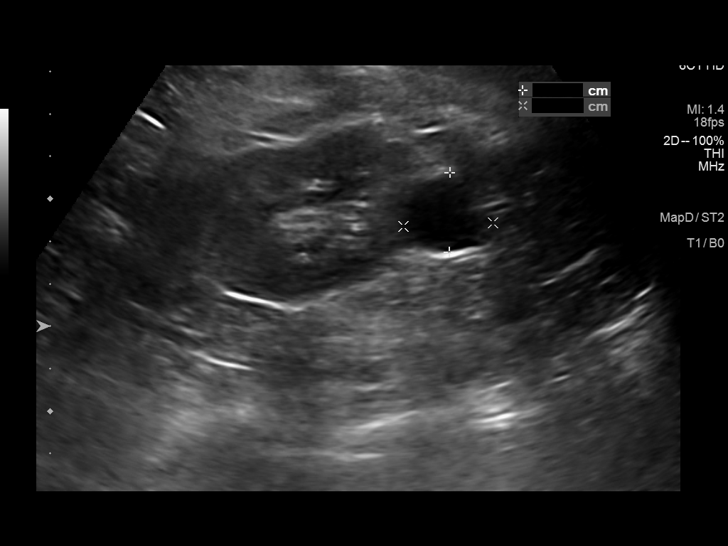
[im 29/39]
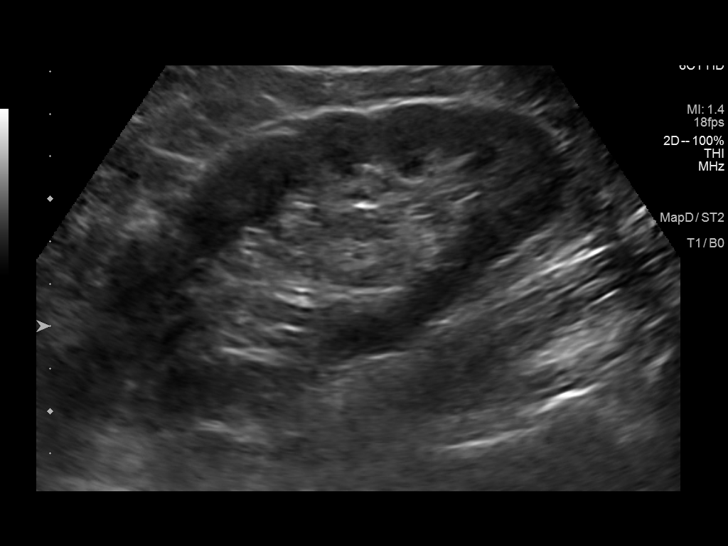
[im 32/39]
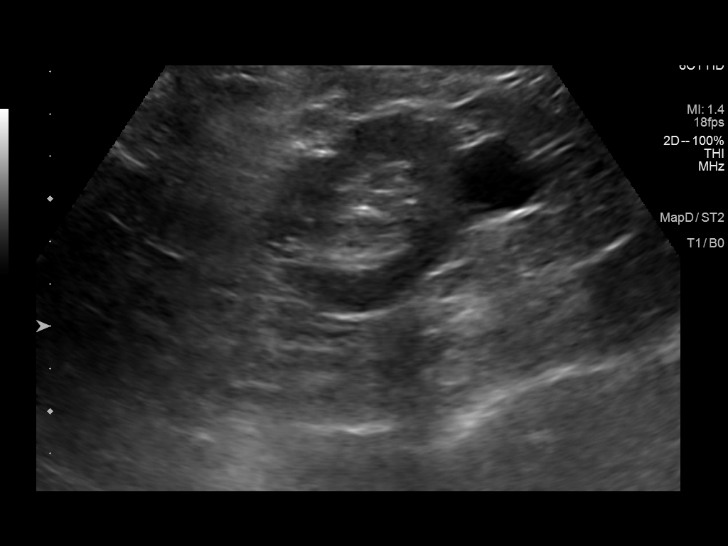
[im 35/39]
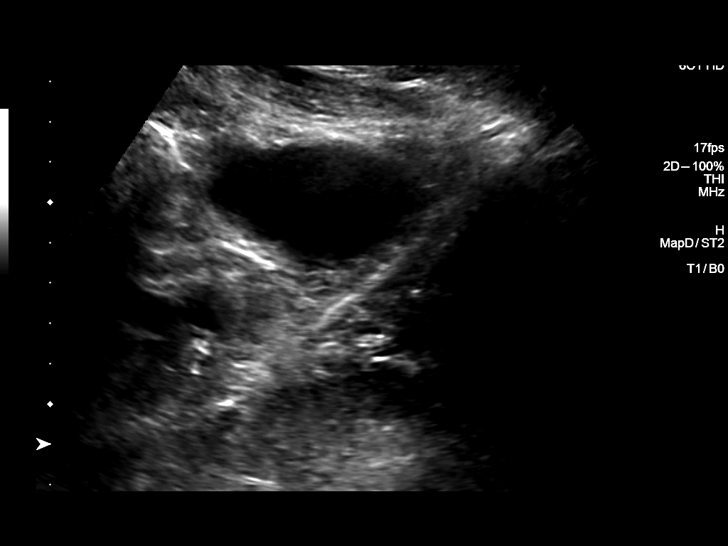
[im 39/39]
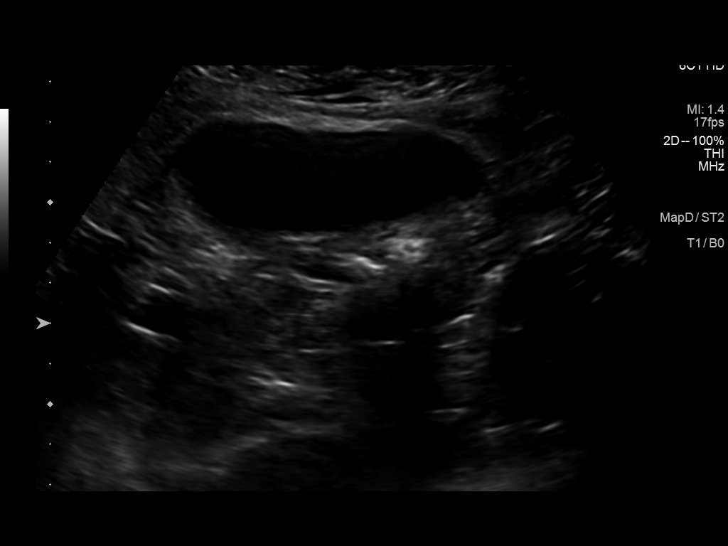

[14 of 25 positions shown; findings below may reference images not displayed]

FINDINGS: Right Kidney:

Renal measurements: 11.7 x 5 x 4.7 cm = volume: 144.5 mL. Cortical
echogenicity within normal limits. No hydronephrosis. Cyst in the
upper pole measuring 3.6 x 3.4 by 3 cm.

Left Kidney:

Renal measurements: 11.5 x 6 x 5.8 cm = volume: 207.7 mL. Cortical
echogenicity within normal limits. No hydronephrosis. Cyst in the
lower pole measuring 1.6 x 1.9 x 2.1 cm.

Bladder:

Appears normal for degree of bladder distention.

Other:

None.
IMPRESSION: 1. Negative for hydronephrosis.
2. Cysts within the bilateral kidneys, fewer than 10 within each
kidney

## 2021-09-05 DIAGNOSIS — E119 Type 2 diabetes mellitus without complications: Secondary | ICD-10-CM | POA: Diagnosis not present

## 2021-09-05 DIAGNOSIS — H5203 Hypermetropia, bilateral: Secondary | ICD-10-CM | POA: Diagnosis not present

## 2021-09-05 DIAGNOSIS — H524 Presbyopia: Secondary | ICD-10-CM | POA: Diagnosis not present

## 2021-09-05 DIAGNOSIS — H25813 Combined forms of age-related cataract, bilateral: Secondary | ICD-10-CM | POA: Diagnosis not present

## 2021-09-05 LAB — HM DIABETES EYE EXAM

## 2021-09-20 ENCOUNTER — Encounter: Payer: Self-pay | Admitting: Family Medicine

## 2021-10-20 ENCOUNTER — Other Ambulatory Visit: Payer: Self-pay | Admitting: Family Medicine

## 2021-10-26 ENCOUNTER — Other Ambulatory Visit: Payer: Self-pay | Admitting: Family Medicine

## 2021-10-26 DIAGNOSIS — E1165 Type 2 diabetes mellitus with hyperglycemia: Secondary | ICD-10-CM

## 2021-11-02 ENCOUNTER — Other Ambulatory Visit: Payer: Medicare Other

## 2021-11-02 DIAGNOSIS — R7303 Prediabetes: Secondary | ICD-10-CM

## 2021-11-02 DIAGNOSIS — E781 Pure hyperglyceridemia: Secondary | ICD-10-CM

## 2021-11-02 DIAGNOSIS — K760 Fatty (change of) liver, not elsewhere classified: Secondary | ICD-10-CM

## 2021-11-02 DIAGNOSIS — N183 Chronic kidney disease, stage 3 unspecified: Secondary | ICD-10-CM

## 2021-11-02 DIAGNOSIS — E1165 Type 2 diabetes mellitus with hyperglycemia: Secondary | ICD-10-CM

## 2021-11-02 DIAGNOSIS — E118 Type 2 diabetes mellitus with unspecified complications: Secondary | ICD-10-CM

## 2021-11-02 DIAGNOSIS — I10 Essential (primary) hypertension: Secondary | ICD-10-CM

## 2021-11-03 ENCOUNTER — Other Ambulatory Visit: Payer: Self-pay

## 2021-11-03 ENCOUNTER — Encounter: Payer: Self-pay | Admitting: Family Medicine

## 2021-11-03 DIAGNOSIS — Z0000 Encounter for general adult medical examination without abnormal findings: Secondary | ICD-10-CM

## 2021-11-03 DIAGNOSIS — I10 Essential (primary) hypertension: Secondary | ICD-10-CM

## 2021-11-03 LAB — COMPREHENSIVE METABOLIC PANEL
AG Ratio: 1.8 (calc) (ref 1.0–2.5)
ALT: 42 U/L (ref 9–46)
AST: 34 U/L (ref 10–35)
Albumin: 4.4 g/dL (ref 3.6–5.1)
Alkaline phosphatase (APISO): 75 U/L (ref 35–144)
BUN/Creatinine Ratio: 28 (calc) — ABNORMAL HIGH (ref 6–22)
BUN: 44 mg/dL — ABNORMAL HIGH (ref 7–25)
CO2: 22 mmol/L (ref 20–32)
Calcium: 9.5 mg/dL (ref 8.6–10.3)
Chloride: 105 mmol/L (ref 98–110)
Creat: 1.56 mg/dL — ABNORMAL HIGH (ref 0.70–1.28)
Globulin: 2.4 g/dL (calc) (ref 1.9–3.7)
Glucose, Bld: 159 mg/dL — ABNORMAL HIGH (ref 65–99)
Potassium: 5.1 mmol/L (ref 3.5–5.3)
Sodium: 137 mmol/L (ref 135–146)
Total Bilirubin: 0.8 mg/dL (ref 0.2–1.2)
Total Protein: 6.8 g/dL (ref 6.1–8.1)

## 2021-11-03 LAB — LIPID PANEL
Cholesterol: 128 mg/dL (ref <200)
HDL: 50 mg/dL (ref >=40)
LDL Cholesterol (Calc): 61 mg/dL (calc)
Non-HDL Cholesterol (Calc): 78 mg/dL (calc) (ref <130)
Total CHOL/HDL Ratio: 2.6 (calc) (ref <5.0)
Triglycerides: 87 mg/dL (ref <150)

## 2021-11-04 ENCOUNTER — Other Ambulatory Visit: Payer: Medicare Other

## 2021-11-04 DIAGNOSIS — E1165 Type 2 diabetes mellitus with hyperglycemia: Secondary | ICD-10-CM

## 2021-11-04 DIAGNOSIS — E781 Pure hyperglyceridemia: Secondary | ICD-10-CM

## 2021-11-04 DIAGNOSIS — Z0000 Encounter for general adult medical examination without abnormal findings: Secondary | ICD-10-CM

## 2021-11-05 LAB — HEMOGLOBIN A1C
Hgb A1c MFr Bld: 5.5 % of total Hgb (ref <5.7)
Mean Plasma Glucose: 111 mg/dL
eAG (mmol/L): 6.2 mmol/L

## 2021-11-05 LAB — PSA: PSA: 0.22 ng/mL (ref <=4.00)

## 2021-12-09 ENCOUNTER — Other Ambulatory Visit: Payer: Self-pay | Admitting: Family Medicine

## 2021-12-09 NOTE — Telephone Encounter (Signed)
Requested medication (s) are due for refill today - yes  Requested medication (s) are on the active medication list -yes  Future visit scheduled -no  Last refill: 06/02/21 #90 1RF  Notes to clinic: Patient fails visit protocol- last visit lab only- no visit notes- sent for review - may need annual physical   Requested Prescriptions  Pending Prescriptions Disp Refills   hydrochlorothiazide (MICROZIDE) 12.5 MG capsule [Pharmacy Med Name: HYDROCHLOROTHIAZIDE 12.'5MG'$  CAPSULES] 90 capsule 1    Sig: TAKE 1 CAPSULE(12.5 MG) BY MOUTH DAILY     Cardiovascular: Diuretics - Thiazide Failed - 12/09/2021  3:31 AM      Failed - Cr in normal range and within 180 days    Creat  Date Value Ref Range Status  11/02/2021 1.56 (H) 0.70 - 1.28 mg/dL Final   Creatinine, Urine  Date Value Ref Range Status  06/16/2020 116 20 - 320 mg/dL Final         Failed - Valid encounter within last 6 months    Recent Outpatient Visits           6 months ago Prediabetes   [place]   9 months ago Skin lesion of right arm   [place] Pickard, Cammie Mcgee, MD   1 year ago Papular urticaria   [place] Pickard, Cammie Mcgee, MD   1 year ago Stage 3 chronic kidney disease, unspecified whether stage 3a or 3b CKD ([place])   [place] Susy Frizzle, MD   1 year ago Stage 3 chronic kidney disease, unspecified whether stage 3a or 3b CKD ([place])   [place] Pickard, Cammie Mcgee, MD              Passed - K in normal range and within 180 days    Potassium  Date Value Ref Range Status  11/02/2021 5.1 3.5 - 5.3 mmol/L Final         Passed - Na in normal range and within 180 days    Sodium  Date Value Ref Range Status  11/02/2021 137 135 - 146 mmol/L Final         Passed - Last BP in normal range    BP Readings from Last 1 Encounters:  02/15/21 132/68            Requested Prescriptions  Pending Prescriptions Disp Refills    hydrochlorothiazide (MICROZIDE) 12.5 MG capsule [Pharmacy Med Name: HYDROCHLOROTHIAZIDE 12.'5MG'$  CAPSULES] 90 capsule 1    Sig: TAKE 1 CAPSULE(12.5 MG) BY MOUTH DAILY     Cardiovascular: Diuretics - Thiazide Failed - 12/09/2021  3:31 AM      Failed - Cr in normal range and within 180 days    Creat  Date Value Ref Range Status  11/02/2021 1.56 (H) 0.70 - 1.28 mg/dL Final   Creatinine, Urine  Date Value Ref Range Status  06/16/2020 116 20 - 320 mg/dL Final         Failed - Valid encounter within last 6 months    Recent Outpatient Visits           6 months ago Prediabetes   [place]   9 months ago Skin lesion of right arm   [place] Pickard, Cammie Mcgee, MD   1 year ago Papular urticaria   [place], Warren T, MD   1 year ago Stage 3 chronic kidney disease, unspecified whether stage  3a or 3b CKD ([place])   [place] Pickard, Cammie Mcgee, MD   1 year ago Stage 3 chronic kidney disease, unspecified whether stage 3a or 3b CKD ([place])   [place] Pickard, Cammie Mcgee, MD              Passed - K in normal range and within 180 days    Potassium  Date Value Ref Range Status  11/02/2021 5.1 3.5 - 5.3 mmol/L Final         Passed - Na in normal range and within 180 days    Sodium  Date Value Ref Range Status  11/02/2021 137 135 - 146 mmol/L Final         Passed - Last BP in normal range    BP Readings from Last 1 Encounters:  02/15/21 132/68

## 2021-12-15 ENCOUNTER — Telehealth: Payer: Self-pay | Admitting: Family Medicine

## 2021-12-15 NOTE — Telephone Encounter (Signed)
Spoke with patient to schedule Medicare Annual Wellness Visit (AWV) either virtually or in office.   Patient declined stating he is not interested   Last AWV 07/16/20 please schedule with Nurse Health Adviser   45 min for awv-i and in office appointments 30 min for awv-s  phone/virtual appointments

## 2022-01-25 ENCOUNTER — Other Ambulatory Visit: Payer: Self-pay | Admitting: Family Medicine

## 2022-01-25 DIAGNOSIS — I10 Essential (primary) hypertension: Secondary | ICD-10-CM

## 2022-03-06 ENCOUNTER — Encounter: Payer: Self-pay | Admitting: Family Medicine

## 2022-03-06 ENCOUNTER — Ambulatory Visit (INDEPENDENT_AMBULATORY_CARE_PROVIDER_SITE_OTHER): Payer: Medicare Other | Admitting: Family Medicine

## 2022-03-06 VITALS — BP 150/72 | HR 61 | Temp 98.3°F | Ht 64.0 in | Wt 176.0 lb

## 2022-03-06 DIAGNOSIS — I10 Essential (primary) hypertension: Secondary | ICD-10-CM | POA: Diagnosis not present

## 2022-03-06 DIAGNOSIS — E118 Type 2 diabetes mellitus with unspecified complications: Secondary | ICD-10-CM | POA: Diagnosis not present

## 2022-03-06 DIAGNOSIS — N183 Chronic kidney disease, stage 3 unspecified: Secondary | ICD-10-CM | POA: Diagnosis not present

## 2022-03-06 DIAGNOSIS — K760 Fatty (change of) liver, not elsewhere classified: Secondary | ICD-10-CM

## 2022-03-06 NOTE — Progress Notes (Signed)
Subjective:    Patient ID: Bradley Baldwin, male    DOB: January 30, 1951, 73 y.o.   MRN: [id number]  HPI Patient history of stage recall eating disease however the pressure today is elevated.  He is also been taking meloxicam frequently for what sounds like plantar fasciitis.  I explained to the patient that he cannot take NSAIDs due to his chronic kidney disease.  I also would like his blood pressure to be less than 130/80.  He states that he is not checking his blood pressure at home.  He denies any chest pain or shortness of breath.  He does get peripheral edema which is mild during the day likely exacerbated by amlodipine.  He denies any hypoglycemic episodes.  He states his fasting blood sugar in the mornings typically 130-150.  His last A1c was 5.5 in October  Past Medical History:  Diagnosis Date   Arthritis    "starting to get achy joints here and there" (11/28/2011)   CKD (chronic kidney disease), stage III (HCC)    proteinuria 750 mg a day   Colon polyps    Diabetes mellitus without complication ([place])    External bleeding hemorrhoids    "occasionally" (11/28/2011)   Fatty liver disease, nonalcoholic    GERD (gastroesophageal reflux disease)    Hypertension     Past Surgical History:  Procedure Laterality Date   TOTAL HIP ARTHROPLASTY  11/28/2011   right   TOTAL HIP ARTHROPLASTY  11/28/2011   Procedure: TOTAL HIP ARTHROPLASTY ANTERIOR APPROACH;  Surgeon: Hessie Dibble, MD;  Location: [place];  Service: Orthopedics;  Laterality: Right;   Current Outpatient Medications on File Prior to Visit  Medication Sig Dispense Refill   amLODipine (NORVASC) 10 MG tablet TAKE 1 TABLET(10 MG) BY MOUTH DAILY 90 tablet 3   aspirin EC 81 MG tablet Take 81 mg by mouth daily.     atenolol (TENORMIN) 50 MG tablet TAKE 1 TABLET(50 MG) BY MOUTH DAILY 90 tablet 1   atorvastatin (LIPITOR) 20 MG tablet TAKE 1 TABLET(20 MG) BY MOUTH DAILY 90 tablet 3   Cetirizine HCl (ZYRTEC ALLERGY) 10 MG TBDP Take 10  mg by mouth at bedtime. 30 tablet 0   FARXIGA 10 MG TABS tablet TAKE 1 TABLET(10 MG) BY MOUTH DAILY BEFORE AND BREAKFAST 90 tablet 3   fluticasone (FLONASE) 50 MCG/ACT nasal spray Place 2 sprays into the nose daily. (Patient taking differently: Place 2 sprays into the nose daily. PRN) 16 g 6   GLIPIZIDE XL 10 MG 24 hr tablet TAKE 1 TABLET(10 MG) BY MOUTH DAILY WITH BREAKFAST 90 tablet 3   glucose blood test strip Use as directed to monitor FSBS 2x daily. Dx E11.9. 100 each 12   hydrochlorothiazide (MICROZIDE) 12.5 MG capsule TAKE 1 CAPSULE(12.5 MG) BY MOUTH DAILY 90 capsule 1   hydrOXYzine (ATARAX/VISTARIL) 25 MG tablet TAKE 1 TABLET(25 MG) BY MOUTH AT BEDTIME AS NEEDED 90 tablet 1   Lancets (ONETOUCH DELICA PLUS [id number]) MISC USE AS DIRECTED TO MONITOR FASTING BLOOD SUGAR TWICE DAILY 100 each 12   lisinopril (ZESTRIL) 40 MG tablet TAKE 1 TABLET(40 MG) BY MOUTH DAILY 90 tablet 3   mometasone (ELOCON) 0.1 % cream Apply topically daily. 45 g 1   triamcinolone cream (KENALOG) 0.1 % Apply 1 application topically 2 (two) times daily. 30 g 0   No current facility-administered medications on file prior to visit.   No Known Allergies Social History   Socioeconomic History   Marital status: Married  Spouse name: Not on file   Number of children: Not on file   Years of education: Not on file   Highest education level: Not on file  Occupational History   Not on file  Tobacco Use   Smoking status: Former    Packs/day: 2.00    Years: 10.00    Total pack years: 20.00    Types: Cigarettes   Smokeless tobacco: Former    Types: Chew   Tobacco comments:    11/28/2011 "quit smoking ~ 30 yr ago; chewed some then quit that"  Substance and Sexual Activity   Alcohol use: Yes    Alcohol/week: 6.0 standard drinks of alcohol    Types: 6 Cans of beer per week    Comment: 11/28/2011 "drink a few beers 3 times/wk; ~ 6pk total/wk"   Drug use: No   Sexual activity: Yes  Other Topics Concern   Not on  file  Social History Narrative   Not on file   Social Determinants of Health   Financial Resource Strain: Low Risk  (07/16/2020)   Overall Financial Resource Strain (CARDIA)    Difficulty of Paying Living Expenses: Not hard at all  Food Insecurity: No Food Insecurity (07/16/2020)   Hunger Vital Sign    Worried About Running Out of Food in the Last Year: Never true    Ran Out of Food in the Last Year: Never true  Transportation Needs: No Transportation Needs (07/16/2020)   PRAPARE - Hydrologist (Medical): No    Lack of Transportation (Non-Medical): No  Physical Activity: Inactive (07/16/2020)   Exercise Vital Sign    Days of Exercise per Week: 0 days    Minutes of Exercise per Session: 0 min  Stress: No Stress Concern Present (07/16/2020)   [place]    Feeling of Stress : Not at all  Social Connections: Moderately Isolated (07/16/2020)   Social Connection and Isolation Panel [NHANES]    Frequency of Communication with Friends and Family: Twice a week    Frequency of Social Gatherings with Friends and Family: Once a week    Attends Religious Services: Never    Marine scientist or Organizations: No    Attends Archivist Meetings: Never    Marital Status: Married  Human resources officer Violence: Not At Risk (07/16/2020)   Humiliation, Afraid, Rape, and Kick questionnaire    Fear of Current or Ex-Partner: No    Emotionally Abused: No    Physically Abused: No    Sexually Abused: No     Review of Systems     Objective:   Physical Exam Vitals reviewed.  Cardiovascular:     Rate and Rhythm: Normal rate and regular rhythm.     Heart sounds: Normal heart sounds. No murmur heard. Pulmonary:     Effort: Pulmonary effort is normal. No respiratory distress.     Breath sounds: Normal breath sounds. No stridor.           Assessment & Plan:  Benign essential  HTN  Controlled type 2 diabetes mellitus with complication, without long-term current use of insulin (HCC)  Stage 3 chronic kidney disease, unspecified whether stage 3a or 3b CKD ([place]) Our entire visit today was spent in discussion.  I want his blood pressure to be less than 130/80 given his chronic kidney disease.  I have asked him to check it daily and notify me in 1 week.  If  greater than 130/80 I will increase his hydrochlorothiazide to 25 mg a day or add doxazosin depending on the level that his blood pressure is at home.  Check his A1c.  If his A1c is less than 6 I will discontinue glipizide.  Monitor his cholesterol.  Recommended stopping all NSAIDs due to his chronic kidney disease

## 2022-03-06 NOTE — Addendum Note (Signed)
Addended by: Chriss Driver on: 03/06/2022 04:27 PM   Modules accepted: Orders

## 2022-03-07 ENCOUNTER — Encounter: Payer: Self-pay | Admitting: Family Medicine

## 2022-03-07 ENCOUNTER — Telehealth: Payer: Self-pay | Admitting: Family Medicine

## 2022-03-07 LAB — LIPID PANEL
Cholesterol: 136 mg/dL (ref <200)
HDL: 49 mg/dL (ref >=40)
LDL Cholesterol (Calc): 68 mg/dL (calc)
Non-HDL Cholesterol (Calc): 87 mg/dL (calc) (ref <130)
Total CHOL/HDL Ratio: 2.8 (calc) (ref <5.0)
Triglycerides: 107 mg/dL (ref <150)

## 2022-03-07 LAB — CBC WITH DIFFERENTIAL/PLATELET
Absolute Monocytes: 669 cells/uL (ref 200–950)
Basophils Absolute: 68 cells/uL (ref 0–200)
Basophils Relative: 0.9 %
Eosinophils Absolute: 198 cells/uL (ref 15–500)
Eosinophils Relative: 2.6 %
HCT: 40.4 % (ref 38.5–50.0)
Hemoglobin: 14.2 g/dL (ref 13.2–17.1)
Lymphs Abs: 3063 cells/uL (ref 850–3900)
MCH: 33.7 pg — ABNORMAL HIGH (ref 27.0–33.0)
MCHC: 35.1 g/dL (ref 32.0–36.0)
MCV: 96 fL (ref 80.0–100.0)
MPV: 11.1 fL (ref 7.5–12.5)
Monocytes Relative: 8.8 %
Neutro Abs: 3602 cells/uL (ref 1500–7800)
Neutrophils Relative %: 47.4 %
Platelets: 226 10*3/uL (ref 140–400)
RBC: 4.21 10*6/uL (ref 4.20–5.80)
RDW: 11.6 % (ref 11.0–15.0)
Total Lymphocyte: 40.3 %
WBC: 7.6 10*3/uL (ref 3.8–10.8)

## 2022-03-07 LAB — COMPLETE METABOLIC PANEL WITH GFR
AG Ratio: 1.5 (calc) (ref 1.0–2.5)
ALT: 51 U/L — ABNORMAL HIGH (ref 9–46)
AST: 45 U/L — ABNORMAL HIGH (ref 10–35)
Albumin: 4.6 g/dL (ref 3.6–5.1)
Alkaline phosphatase (APISO): 76 U/L (ref 35–144)
BUN/Creatinine Ratio: 22 (calc) (ref 6–22)
BUN: 40 mg/dL — ABNORMAL HIGH (ref 7–25)
CO2: 22 mmol/L (ref 20–32)
Calcium: 9.8 mg/dL (ref 8.6–10.3)
Chloride: 103 mmol/L (ref 98–110)
Creat: 1.84 mg/dL — ABNORMAL HIGH (ref 0.70–1.28)
Globulin: 3 g/dL (calc) (ref 1.9–3.7)
Glucose, Bld: 150 mg/dL — ABNORMAL HIGH (ref 65–99)
Potassium: 5 mmol/L (ref 3.5–5.3)
Sodium: 135 mmol/L (ref 135–146)
Total Bilirubin: 0.9 mg/dL (ref 0.2–1.2)
Total Protein: 7.6 g/dL (ref 6.1–8.1)
eGFR: 39 mL/min/{1.73_m2} — ABNORMAL LOW (ref >=60)

## 2022-03-07 LAB — HEMOGLOBIN A1C
Hgb A1c MFr Bld: 6.1 % of total Hgb — ABNORMAL HIGH (ref <5.7)
Mean Plasma Glucose: 128 mg/dL
eAG (mmol/L): 7.1 mmol/L

## 2022-03-07 NOTE — Telephone Encounter (Signed)
Patient's wife Janace Hoard called to report the patient will continue taking the glipizide for the time being. Patient scheduled for a metabolic panel on 0/2/63; wife stated he will follow up with Dr. Dennard Schaumann.   Please advise Angie if needed at [phone].

## 2022-03-14 ENCOUNTER — Ambulatory Visit: Payer: Medicare Other | Admitting: Family Medicine

## 2022-03-20 ENCOUNTER — Encounter: Payer: Self-pay | Admitting: Family Medicine

## 2022-04-05 ENCOUNTER — Other Ambulatory Visit: Payer: Medicare Other

## 2022-04-05 DIAGNOSIS — E118 Type 2 diabetes mellitus with unspecified complications: Secondary | ICD-10-CM

## 2022-04-05 DIAGNOSIS — K760 Fatty (change of) liver, not elsewhere classified: Secondary | ICD-10-CM

## 2022-04-05 DIAGNOSIS — N183 Chronic kidney disease, stage 3 unspecified: Secondary | ICD-10-CM

## 2022-04-05 LAB — BASIC METABOLIC PANEL
BUN/Creatinine Ratio: 23 (calc) — ABNORMAL HIGH (ref 6–22)
BUN: 37 mg/dL — ABNORMAL HIGH (ref 7–25)
CO2: 25 mmol/L (ref 20–32)
Calcium: 9.3 mg/dL (ref 8.6–10.3)
Chloride: 104 mmol/L (ref 98–110)
Creat: 1.58 mg/dL — ABNORMAL HIGH (ref 0.70–1.28)
Glucose, Bld: 156 mg/dL — ABNORMAL HIGH (ref 65–99)
Potassium: 5.4 mmol/L — ABNORMAL HIGH (ref 3.5–5.3)
Sodium: 136 mmol/L (ref 135–146)

## 2022-04-08 ENCOUNTER — Other Ambulatory Visit: Payer: Self-pay | Admitting: Family Medicine

## 2022-04-11 ENCOUNTER — Encounter: Payer: Self-pay | Admitting: Family Medicine

## 2022-05-12 ENCOUNTER — Other Ambulatory Visit: Payer: Self-pay | Admitting: Family Medicine

## 2022-05-12 NOTE — Telephone Encounter (Signed)
Unable to refill per protocol, Rx request is too soon. Last refill 08/08/21 for 90 and 3 refills.  Requested Prescriptions  Pending Prescriptions Disp Refills   FARXIGA 10 MG TABS tablet [Pharmacy Med Name: FARXIGA 10MG  TABLETS] 90 tablet 3    Sig: TAKE 1 TABLET BY MOUTH EVERY DAY BEFORE BREAKFAST     Endocrinology:  Diabetes - SGLT2 Inhibitors Failed - 05/12/2022  8:05 AM      Failed - Cr in normal range and within 360 days    Creat  Date Value Ref Range Status  04/05/2022 1.58 (H) 0.70 - 1.28 mg/dL Final   Creatinine, Urine  Date Value Ref Range Status  06/16/2020 116 20 - 320 mg/dL Final         Failed - eGFR in normal range and within 360 days    GFR, Est African American  Date Value Ref Range Status  06/16/2020 48 (L) > OR = 60 mL/min/1.47m2 Final   GFR, Est Non African American  Date Value Ref Range Status  06/16/2020 42 (L) > OR = 60 mL/min/1.47m2 Final   eGFR  Date Value Ref Range Status  03/06/2022 39 (L) > OR = 60 mL/min/1.49m2 Final         Failed - Valid encounter within last 6 months    Recent Outpatient Visits           11 months ago Prediabetes   [facility]   1 year ago Skin lesion of right arm   [facility] Pickard, Priscille Heidelberg, MD   1 year ago Papular urticaria   [facility] Pickard, Priscille Heidelberg, MD   1 year ago Stage 3 chronic kidney disease, unspecified whether stage 3a or 3b CKD (HCC)   [facility] Donita Brooks, MD   1 year ago Stage 3 chronic kidney disease, unspecified whether stage 3a or 3b CKD (HCC)   [facility] Pickard, Priscille Heidelberg, MD              Passed - HBA1C is between 0 and 7.9 and within 180 days    Hgb A1c MFr Bld  Date Value Ref Range Status  03/06/2022 6.1 (H) <5.7 % of total Hgb Final    Comment:    For someone without known diabetes, a hemoglobin  A1c value between 5.7% and 6.4% is consistent with prediabetes and should be confirmed with a   follow-up test. . For someone with known diabetes, a value <7% indicates that their diabetes is well controlled. A1c targets should be individualized based on duration of diabetes, age, comorbid conditions, and other considerations. . This assay result is consistent with an increased risk of diabetes. . Currently, no consensus exists regarding use of hemoglobin A1c for diagnosis of diabetes for children. Marland Kitchen

## 2022-06-10 ENCOUNTER — Other Ambulatory Visit: Payer: Self-pay | Admitting: Family Medicine

## 2022-06-12 NOTE — Telephone Encounter (Signed)
Requested Prescriptions  Pending Prescriptions Disp Refills   hydrochlorothiazide (MICROZIDE) 12.5 MG capsule [Pharmacy Med Name: HYDROCHLOROTHIAZIDE 12.5MG  CAPSULES] 90 capsule 1    Sig: TAKE 1 CAPSULE(12.5 MG) BY MOUTH DAILY     Cardiovascular: Diuretics - Thiazide Failed - 06/10/2022  3:31 AM      Failed - Cr in normal range and within 180 days    Creat  Date Value Ref Range Status  04/05/2022 1.58 (H) 0.70 - 1.28 mg/dL Final   Creatinine, Urine  Date Value Ref Range Status  06/16/2020 116 20 - 320 mg/dL Final         Failed - K in normal range and within 180 days    Potassium  Date Value Ref Range Status  04/05/2022 5.4 (H) 3.5 - 5.3 mmol/L Final         Failed - Last BP in normal range    BP Readings from Last 1 Encounters:  03/06/22 (!) 150/72         Failed - Valid encounter within last 6 months    Recent Outpatient Visits           1 year ago Prediabetes   [facility]   1 year ago Skin lesion of right arm   [facility] Pickard, Priscille Heidelberg, MD   1 year ago Papular urticaria   [facility] Donita Brooks, MD   1 year ago Stage 3 chronic kidney disease, unspecified whether stage 3a or 3b CKD (HCC)   [facility] Donita Brooks, MD   1 year ago Stage 3 chronic kidney disease, unspecified whether stage 3a or 3b CKD (HCC)   [facility] Pickard, Priscille Heidelberg, MD              Passed - Na in normal range and within 180 days    Sodium  Date Value Ref Range Status  04/05/2022 136 135 - 146 mmol/L Final

## 2022-07-13 ENCOUNTER — Other Ambulatory Visit: Payer: Self-pay | Admitting: Family Medicine

## 2022-07-13 ENCOUNTER — Telehealth: Payer: Self-pay

## 2022-07-13 ENCOUNTER — Other Ambulatory Visit: Payer: Self-pay

## 2022-07-13 MED ORDER — MOLNUPIRAVIR EUA 200MG CAPSULE: 4.0000 | ORAL_CAPSULE | Freq: Two times a day (BID) | ORAL | 0 refills | Status: AC

## 2022-07-13 MED ORDER — MOLNUPIRAVIR EUA 200MG CAPSULE: 4.0000 | ORAL_CAPSULE | Freq: Two times a day (BID) | ORAL | 0 refills | Status: DC

## 2022-07-13 NOTE — Telephone Encounter (Signed)
Pt's wife called asking if rx for Covid can be sent to the [facility] on [address] in [place]. Pt states that is the only [facility] that has the Rx.   Pharmacy added to patient's chart. Thank you!

## 2022-08-22 ENCOUNTER — Other Ambulatory Visit: Payer: Self-pay | Admitting: Family Medicine

## 2022-08-22 ENCOUNTER — Telehealth: Payer: Self-pay | Admitting: Family Medicine

## 2022-08-22 ENCOUNTER — Encounter: Payer: Self-pay | Admitting: Family Medicine

## 2022-08-22 ENCOUNTER — Other Ambulatory Visit: Payer: Self-pay

## 2022-08-22 DIAGNOSIS — N183 Chronic kidney disease, stage 3 unspecified: Secondary | ICD-10-CM

## 2022-08-22 DIAGNOSIS — E118 Type 2 diabetes mellitus with unspecified complications: Secondary | ICD-10-CM

## 2022-08-22 MED ORDER — DAPAGLIFLOZIN PROPANEDIOL 10 MG PO TABS
10.0000 mg | ORAL_TABLET | Freq: Every day | ORAL | 0 refills | Status: DC
Start: 2022-08-22 — End: 2022-11-24

## 2022-08-22 NOTE — Telephone Encounter (Signed)
Pharmacy sent eFax to request drug change; dapagliflozin propanediol (FARXIGA) 10 MG TABS tablet  not covered under patient's insurance plan.  Preferred alternative is [address], [facility]  Pharmacy:  [facility] #[id number] [place], [place] - [address] AT [facility] [address] [place] [address] Phone: [phone]  Fax: [phone] DEA #: [id number]   Please advise pharmacist.

## 2022-09-15 ENCOUNTER — Encounter: Payer: Self-pay | Admitting: Family Medicine

## 2022-09-15 ENCOUNTER — Ambulatory Visit (INDEPENDENT_AMBULATORY_CARE_PROVIDER_SITE_OTHER): Payer: Medicare Other | Admitting: Family Medicine

## 2022-09-15 VITALS — BP 114/60 | HR 54 | Temp 98.0°F | Ht 64.0 in | Wt 173.2 lb

## 2022-09-15 DIAGNOSIS — Z125 Encounter for screening for malignant neoplasm of prostate: Secondary | ICD-10-CM

## 2022-09-15 DIAGNOSIS — R399 Unspecified symptoms and signs involving the genitourinary system: Secondary | ICD-10-CM | POA: Diagnosis not present

## 2022-09-15 DIAGNOSIS — E118 Type 2 diabetes mellitus with unspecified complications: Secondary | ICD-10-CM

## 2022-09-15 DIAGNOSIS — N183 Chronic kidney disease, stage 3 unspecified: Secondary | ICD-10-CM

## 2022-09-15 DIAGNOSIS — R350 Frequency of micturition: Secondary | ICD-10-CM

## 2022-09-15 DIAGNOSIS — Z7984 Long term (current) use of oral hypoglycemic drugs: Secondary | ICD-10-CM

## 2022-09-15 DIAGNOSIS — N401 Enlarged prostate with lower urinary tract symptoms: Secondary | ICD-10-CM

## 2022-09-15 MED ORDER — GLUCOSE BLOOD VI STRP: ORAL_STRIP | 12 refills | Status: DC

## 2022-09-15 MED ORDER — SILDENAFIL CITRATE 100 MG PO TABS: 50.0000 mg | ORAL_TABLET | Freq: Every day | ORAL | 11 refills | Status: DC | PRN

## 2022-09-15 NOTE — Progress Notes (Signed)
Subjective:    Patient ID: ATO CRONEN, male    DOB: 09/17/1950, 72 y.o.   MRN: [id number]  HPI Patient is a very pleasant 72 year old Caucasian gentleman who has a history of type 2 diabetes mellitus and also fatty liver disease.  He is currently managing his blood sugar with [place].  He has a history of chronic kidney disease and is getting dual benefit from the [place].  However he is also occasionally using glipizide.  He states that if his blood sugar is high in the morning (greater than 130) he will take glipizide in addition to the [place].  He also reports increased gas and bloating.  He has occasional loose stool.  He also reports nocturia.  He states that he wakes up every 3-4 hours at night to urinate and this is frustrating for him.  However he does drink a lot of liquids prior to bedtime.  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     atenolol (TENORMIN) 50 MG tablet TAKE 1 TABLET(50 MG) BY MOUTH DAILY 90 tablet 1   atorvastatin (LIPITOR) 20 MG tablet TAKE 1 TABLET(20 MG) BY MOUTH DAILY 90 tablet 3    Cetirizine HCl (ZYRTEC ALLERGY) 10 MG TBDP Take 10 mg by mouth at bedtime. 30 tablet 0   dapagliflozin propanediol (FARXIGA) 10 MG TABS tablet Take 1 tablet (10 mg total) by mouth daily. 90 tablet 0   fluticasone (FLONASE) 50 MCG/ACT nasal spray Place 2 sprays into the nose daily. (Patient taking differently: Place 2 sprays into the nose daily. PRN) 16 g 6   GLIPIZIDE XL 10 MG 24 hr tablet TAKE 1 TABLET(10 MG) BY MOUTH DAILY WITH BREAKFAST 90 tablet 3   glucose blood test strip Use as directed to monitor FSBS 2x daily. Dx E11.9. 100 each 12   hydrochlorothiazide (MICROZIDE) 12.5 MG capsule TAKE 1 CAPSULE(12.5 MG) BY MOUTH DAILY 90 capsule 1   hydrOXYzine (ATARAX/VISTARIL) 25 MG tablet TAKE 1 TABLET(25 MG) BY MOUTH AT BEDTIME AS NEEDED 90 tablet 1   Lancets (ONETOUCH DELICA PLUS LANCET33G) MISC USE AS DIRECTED TO MONITOR FASTING BLOOD SUGAR TWICE DAILY 100 each 12   lisinopril (ZESTRIL) 40 MG tablet TAKE 1 TABLET(40 MG) BY MOUTH DAILY 90 tablet 3   mometasone (ELOCON) 0.1 % cream Apply topically daily. 45 g 1   triamcinolone cream (KENALOG) 0.1 % Apply 1 application topically 2 (two) times daily. 30 g 0   No current facility-administered medications on file prior to visit.   No Known  Allergies Social History   Socioeconomic History   Marital status: Married    Spouse name: Not on file   Number of children: Not on file   Years of education: Not on file   Highest education level: Not on file  Occupational History   Not on file  Tobacco Use   Smoking status: Former    Current packs/day: 2.00    Average packs/day: 2.0 packs/day for 10.0 years (20.0 ttl pk-yrs)    Types: Cigarettes   Smokeless tobacco: Former    Types: Chew   Tobacco comments:    11/28/2011 "quit smoking ~ 30 yr ago; chewed some then quit that"  Substance and Sexual Activity   Alcohol use: Yes    Alcohol/week: 6.0 standard drinks of alcohol    Types: 6 Cans of beer per week    Comment: 11/28/2011 "drink a few beers 3  times/wk; ~ 6pk total/wk"   Drug use: No   Sexual activity: Yes  Other Topics Concern   Not on file  Social History Narrative   Not on file   Social Determinants of Health   Financial Resource Strain: Low Risk  (07/16/2020)   Overall Financial Resource Strain (CARDIA)    Difficulty of Paying Living Expenses: Not hard at all  Food Insecurity: No Food Insecurity (07/16/2020)   Hunger Vital Sign    Worried About Running Out of Food in the Last Year: Never true    Ran Out of Food in the Last Year: Never true  Transportation Needs: No Transportation Needs (07/16/2020)   PRAPARE - Administrator, Civil Service (Medical): No    Lack of Transportation (Non-Medical): No  Physical Activity: Inactive (07/16/2020)   Exercise Vital Sign    Days of Exercise per Week: 0 days    Minutes of Exercise per Session: 0 min  Stress: No Stress Concern Present (07/16/2020)   [employer] of Occupational Health - Occupational Stress Questionnaire    Feeling of Stress : Not at all  Social Connections: Moderately Isolated (07/16/2020)   Social Connection and Isolation Panel [NHANES]    Frequency of Communication with Friends and Family: Twice a week    Frequency of Social Gatherings with Friends and Family: Once a week    Attends Religious Services: Never    Database administrator or Organizations: No    Attends Banker Meetings: Never    Marital Status: Married  Catering manager Violence: Not At Risk (07/16/2020)   Humiliation, Afraid, Rape, and Kick questionnaire    Fear of Current or Ex-Partner: No    Emotionally Abused: No    Physically Abused: No    Sexually Abused: No     Review of Systems     Objective:   Physical Exam Vitals reviewed.  Cardiovascular:     Rate and Rhythm: Normal rate and regular rhythm.     Heart sounds: Normal heart sounds. No murmur heard. Pulmonary:     Effort: Pulmonary effort is normal. No respiratory distress.     Breath sounds: Normal  breath sounds. No stridor.           Assessment & Plan:  Controlled type 2 diabetes mellitus with complication, without long-term current use of insulin (HCC) - Plan: Microalbumin / creatinine urine ratio, Hemoglobin A1c, CBC with Differential/Platelet, COMPLETE METABOLIC PANEL WITH GFR, Lipid panel, Protein / Creatinine Ratio, Urine  Stage 3 chronic kidney disease, unspecified whether stage 3a or 3b CKD (HCC) - Plan: Microalbumin /  creatinine urine ratio  Prostate cancer screening - Plan: PSA  Lower urinary tract symptoms (LUTS) - Plan: PSA  Benign prostatic hyperplasia with urinary frequency - Plan: PSA I am extremely happy with his blood pressure.  It is well-controlled.  I will screen for prostate cancer with PSA.  I will also check a CBC, CMP, fasting lipid panel, and an A1c.  I like to see his A1c less than 6.5.  Given his history of fatty liver disease, I believe that Ozempic would be a better alternative for him than glipizide.  We discussed this at length today however the patient is also concerned about cost and potential side effects.  Therefore we will await the results of his lab work that he can determine if he wants to switch glipizide to [employer]

## 2022-09-15 NOTE — Addendum Note (Signed)
Addended by: Lynnea Ferrier T on: 09/15/2022 01:12 PM   Modules accepted: Orders

## 2022-09-16 LAB — COMPLETE METABOLIC PANEL WITH GFR
AG Ratio: 1.5 (calc) (ref 1.0–2.5)
ALT: 56 U/L — ABNORMAL HIGH (ref 9–46)
AST: 52 U/L — ABNORMAL HIGH (ref 10–35)
Albumin: 4.8 g/dL (ref 3.6–5.1)
Alkaline phosphatase (APISO): 86 U/L (ref 35–144)
BUN/Creatinine Ratio: 25 (calc) — ABNORMAL HIGH (ref 6–22)
BUN: 42 mg/dL — ABNORMAL HIGH (ref 7–25)
CO2: 21 mmol/L (ref 20–32)
Calcium: 10.3 mg/dL (ref 8.6–10.3)
Chloride: 100 mmol/L (ref 98–110)
Creat: 1.7 mg/dL — ABNORMAL HIGH (ref 0.70–1.28)
Globulin: 3.3 g/dL (ref 1.9–3.7)
Glucose, Bld: 145 mg/dL — ABNORMAL HIGH (ref 65–99)
Potassium: 5.5 mmol/L — ABNORMAL HIGH (ref 3.5–5.3)
Sodium: 133 mmol/L — ABNORMAL LOW (ref 135–146)
Total Bilirubin: 1 mg/dL (ref 0.2–1.2)
Total Protein: 8.1 g/dL (ref 6.1–8.1)
eGFR: 43 mL/min/{1.73_m2} — ABNORMAL LOW (ref >=60)

## 2022-09-16 LAB — CBC WITH DIFFERENTIAL/PLATELET
Absolute Monocytes: 755 {cells}/uL (ref 200–950)
Basophils Absolute: 91 {cells}/uL (ref 0–200)
Basophils Relative: 1 %
Eosinophils Absolute: 255 cells/uL (ref 15–500)
Eosinophils Relative: 2.8 %
HCT: 43 % (ref 38.5–50.0)
Hemoglobin: 14.8 g/dL (ref 13.2–17.1)
Lymphs Abs: 3622 {cells}/uL (ref 850–3900)
MCH: 34 pg — ABNORMAL HIGH (ref 27.0–33.0)
MCHC: 34.4 g/dL (ref 32.0–36.0)
MCV: 98.9 fL (ref 80.0–100.0)
MPV: 10.7 fL (ref 7.5–12.5)
Monocytes Relative: 8.3 %
Neutro Abs: 4377 {cells}/uL (ref 1500–7800)
Neutrophils Relative %: 48.1 %
Platelets: 236 10*3/uL (ref 140–400)
RBC: 4.35 10*6/uL (ref 4.20–5.80)
RDW: 11.7 % (ref 11.0–15.0)
Total Lymphocyte: 39.8 %
WBC: 9.1 10*3/uL (ref 3.8–10.8)

## 2022-09-16 LAB — MICROALBUMIN / CREATININE URINE RATIO
Creatinine, Urine: 52 mg/dL (ref 20–320)
Microalb Creat Ratio: 842 mg/g{creat} — ABNORMAL HIGH (ref <30)
Microalb, Ur: 43.8 mg/dL

## 2022-09-16 LAB — LIPID PANEL
Cholesterol: 158 mg/dL (ref <200)
HDL: 50 mg/dL (ref >=40)
LDL Cholesterol (Calc): 83 mg/dL
Non-HDL Cholesterol (Calc): 108 mg/dL (ref <130)
Total CHOL/HDL Ratio: 3.2 (calc) (ref <5.0)
Triglycerides: 146 mg/dL (ref <150)

## 2022-09-16 LAB — HEMOGLOBIN A1C
Hgb A1c MFr Bld: 5.7 %{Hb} — ABNORMAL HIGH (ref <5.7)
Mean Plasma Glucose: 117 mg/dL
eAG (mmol/L): 6.5 mmol/L

## 2022-09-16 LAB — PSA: PSA: 0.22 ng/mL (ref <=4.00)

## 2022-09-19 ENCOUNTER — Encounter: Payer: Self-pay | Admitting: Family Medicine

## 2022-09-19 ENCOUNTER — Other Ambulatory Visit: Payer: Self-pay

## 2022-09-19 DIAGNOSIS — N183 Chronic kidney disease, stage 3 unspecified: Secondary | ICD-10-CM

## 2022-09-19 DIAGNOSIS — E118 Type 2 diabetes mellitus with unspecified complications: Secondary | ICD-10-CM

## 2022-09-19 DIAGNOSIS — K760 Fatty (change of) liver, not elsewhere classified: Secondary | ICD-10-CM

## 2022-09-19 MED ORDER — OZEMPIC (0.25 OR 0.5 MG/DOSE) 2 MG/3ML ~~LOC~~ SOPN
0.5000 mg | PEN_INJECTOR | SUBCUTANEOUS | 1 refills | Status: AC
Start: 2022-09-19 — End: ?

## 2022-09-20 DIAGNOSIS — E119 Type 2 diabetes mellitus without complications: Secondary | ICD-10-CM | POA: Insufficient documentation

## 2022-09-27 ENCOUNTER — Encounter: Payer: Self-pay | Admitting: Family Medicine

## 2022-09-28 ENCOUNTER — Other Ambulatory Visit: Payer: Medicare Other

## 2022-09-28 DIAGNOSIS — N183 Chronic kidney disease, stage 3 unspecified: Secondary | ICD-10-CM

## 2022-09-28 DIAGNOSIS — E875 Hyperkalemia: Secondary | ICD-10-CM

## 2022-09-29 ENCOUNTER — Encounter: Payer: Self-pay | Admitting: Family Medicine

## 2022-09-29 ENCOUNTER — Other Ambulatory Visit: Payer: Self-pay | Admitting: Family Medicine

## 2022-09-29 ENCOUNTER — Encounter: Payer: Medicare Other | Admitting: Family Medicine

## 2022-09-29 LAB — COMPREHENSIVE METABOLIC PANEL
AG Ratio: 1.5 (calc) (ref 1.0–2.5)
ALT: 35 U/L (ref 9–46)
AST: 25 U/L (ref 10–35)
Albumin: 4.4 g/dL (ref 3.6–5.1)
Alkaline phosphatase (APISO): 69 U/L (ref 35–144)
BUN/Creatinine Ratio: 36 (calc) — ABNORMAL HIGH (ref 6–22)
BUN: 68 mg/dL — ABNORMAL HIGH (ref 7–25)
CO2: 21 mmol/L (ref 20–32)
Calcium: 9.8 mg/dL (ref 8.6–10.3)
Chloride: 98 mmol/L (ref 98–110)
Creat: 1.9 mg/dL — ABNORMAL HIGH (ref 0.70–1.28)
Globulin: 3 g/dL (ref 1.9–3.7)
Glucose, Bld: 140 mg/dL — ABNORMAL HIGH (ref 65–99)
Potassium: 5.2 mmol/L (ref 3.5–5.3)
Sodium: 131 mmol/L — ABNORMAL LOW (ref 135–146)
Total Bilirubin: 0.6 mg/dL (ref 0.2–1.2)
Total Protein: 7.4 g/dL (ref 6.1–8.1)

## 2022-09-29 MED ORDER — LISINOPRIL 40 MG PO TABS: 20.0000 mg | ORAL_TABLET | Freq: Every day | ORAL | 3 refills | Status: DC

## 2022-10-27 ENCOUNTER — Other Ambulatory Visit: Payer: Self-pay

## 2022-10-27 ENCOUNTER — Encounter: Payer: Self-pay | Admitting: Family Medicine

## 2022-10-30 ENCOUNTER — Other Ambulatory Visit: Payer: Self-pay

## 2022-10-30 DIAGNOSIS — K760 Fatty (change of) liver, not elsewhere classified: Secondary | ICD-10-CM

## 2022-10-30 DIAGNOSIS — I10 Essential (primary) hypertension: Secondary | ICD-10-CM

## 2022-10-30 DIAGNOSIS — E118 Type 2 diabetes mellitus with unspecified complications: Secondary | ICD-10-CM

## 2022-10-30 DIAGNOSIS — N183 Chronic kidney disease, stage 3 unspecified: Secondary | ICD-10-CM

## 2022-10-30 MED ORDER — SEMAGLUTIDE (1 MG/DOSE) 4 MG/3ML ~~LOC~~ SOPN
1.0000 mg | PEN_INJECTOR | SUBCUTANEOUS | 0 refills | Status: DC
Start: 2022-10-30 — End: 2022-11-23

## 2022-11-03 ENCOUNTER — Ambulatory Visit (INDEPENDENT_AMBULATORY_CARE_PROVIDER_SITE_OTHER): Payer: Medicare Other | Admitting: Audiology

## 2022-11-03 DIAGNOSIS — H903 Sensorineural hearing loss, bilateral: Secondary | ICD-10-CM

## 2022-11-03 NOTE — Progress Notes (Signed)
Patient was seen today for a hearing aid consultation. Trialed patient with Oticon Real 3 hearing aids bilaterally with 8mm OpenBass domes and size 2, 85 power receivers. Set patient at around 70% overall gain and adjusted per patient's comfort. He was satisfied with the fit, sound volume, clarity, and balance between his ears. Practiced placing the hearing aids in his ears and in the charger. Discussed the BTE and ITE styles. He decided to hold off connecting the hearing aids to his phone. Patient was instructed to call if any issues arise.  Conley Rolls Dekota Kirlin, AUD  11/03/22

## 2022-11-17 ENCOUNTER — Ambulatory Visit (INDEPENDENT_AMBULATORY_CARE_PROVIDER_SITE_OTHER): Payer: Medicare Other | Admitting: Audiology

## 2022-11-17 NOTE — Progress Notes (Signed)
Patient was seen today for a fitting of his Oticon Intent 3 hearing aids. Switched him to size 3, 85 power receivers bilaterally to help with comfort. Set patient at around 70% overall gain. He stated that the balance, clarity, and volume were good. Reviewed maintenance and care for the devices. Connected the hearing aids to his TV adapter, phone Bluetooth, and [employer]. Will call patient before his 30 day trial ends and instructed him to call if any concerns arise.   Conley Rolls Treesa Mccully, AUD, CCC-A 11/17/22

## 2022-11-23 ENCOUNTER — Other Ambulatory Visit: Payer: Self-pay | Admitting: Family Medicine

## 2022-11-23 ENCOUNTER — Other Ambulatory Visit: Payer: Self-pay

## 2022-11-23 DIAGNOSIS — E118 Type 2 diabetes mellitus with unspecified complications: Secondary | ICD-10-CM

## 2022-11-23 DIAGNOSIS — N183 Chronic kidney disease, stage 3 unspecified: Secondary | ICD-10-CM

## 2022-11-23 DIAGNOSIS — K760 Fatty (change of) liver, not elsewhere classified: Secondary | ICD-10-CM

## 2022-11-23 MED ORDER — SEMAGLUTIDE (2 MG/DOSE) 8 MG/3ML ~~LOC~~ SOPN
2.0000 mg | PEN_INJECTOR | SUBCUTANEOUS | 1 refills | Status: DC
Start: 2022-11-23 — End: 2023-01-25

## 2022-11-24 NOTE — Telephone Encounter (Signed)
Requested Prescriptions  Pending Prescriptions Disp Refills   FARXIGA 10 MG TABS tablet [Pharmacy Med Name: FARXIGA 10MG  TABLETS] 90 tablet 0    Sig: TAKE 1 TABLET(10 MG) BY MOUTH DAILY     Endocrinology:  Diabetes - SGLT2 Inhibitors Failed - 11/23/2022  3:31 AM      Failed - Cr in normal range and within 360 days    Creat  Date Value Ref Range Status  09/28/2022 1.90 (H) 0.70 - 1.28 mg/dL Final   Creatinine, Urine  Date Value Ref Range Status  09/15/2022 52 20 - 320 mg/dL Final         Failed - eGFR in normal range and within 360 days    GFR, Est African American  Date Value Ref Range Status  06/16/2020 48 (L) > OR = 60 mL/min/1.53m2 Final   GFR, Est Non African American  Date Value Ref Range Status  06/16/2020 42 (L) > OR = 60 mL/min/1.10m2 Final   eGFR  Date Value Ref Range Status  09/15/2022 43 (L) > OR = 60 mL/min/1.80m2 Final         Failed - Valid encounter within last 6 months    Recent Outpatient Visits           1 year ago Prediabetes   [facility]   1 year ago Skin lesion of right arm   [facility] Pickard, Priscille Heidelberg, MD   2 years ago Papular urticaria   [facility] Tanya Nones, Priscille Heidelberg, MD   2 years ago Stage 3 chronic kidney disease, unspecified whether stage 3a or 3b CKD (HCC)   [facility] Donita Brooks, MD   2 years ago Stage 3 chronic kidney disease, unspecified whether stage 3a or 3b CKD (HCC)   [facility] Pickard, Priscille Heidelberg, MD              Passed - HBA1C is between 0 and 7.9 and within 180 days    Hgb A1c MFr Bld  Date Value Ref Range Status  09/15/2022 5.7 (H) <5.7 % of total Hgb Final    Comment:    For someone without known diabetes, a hemoglobin  A1c value between 5.7% and 6.4% is consistent with prediabetes and should be confirmed with a  follow-up test. . For someone with known diabetes, a value <7% indicates that their diabetes is well  controlled. A1c targets should be individualized based on duration of diabetes, age, comorbid conditions, and other considerations. . This assay result is consistent with an increased risk of diabetes. . Currently, no consensus exists regarding use of hemoglobin A1c for diagnosis of diabetes for children. Marland Kitchen

## 2022-11-28 ENCOUNTER — Other Ambulatory Visit: Payer: Self-pay

## 2022-11-28 ENCOUNTER — Encounter: Payer: Self-pay | Admitting: Family Medicine

## 2022-12-01 ENCOUNTER — Other Ambulatory Visit: Payer: Self-pay

## 2022-12-01 DIAGNOSIS — E118 Type 2 diabetes mellitus with unspecified complications: Secondary | ICD-10-CM

## 2022-12-01 MED ORDER — GLUCOSE BLOOD VI STRP
ORAL_STRIP | 12 refills | Status: AC
Start: 2022-12-01 — End: ?

## 2022-12-16 ENCOUNTER — Other Ambulatory Visit: Payer: Self-pay | Admitting: Family Medicine

## 2022-12-21 ENCOUNTER — Other Ambulatory Visit: Payer: Self-pay | Admitting: Family Medicine

## 2022-12-22 ENCOUNTER — Other Ambulatory Visit: Payer: Self-pay | Admitting: Family Medicine

## 2022-12-22 NOTE — Telephone Encounter (Signed)
Courtesy refill. Requested Prescriptions  Pending Prescriptions Disp Refills   atenolol (TENORMIN) 50 MG tablet [Pharmacy Med Name: ATENOLOL 50MG  TABLETS] 30 tablet 0    Sig: TAKE 1 TABLET(50 MG) BY MOUTH DAILY     Cardiovascular: Beta Blockers 2 Failed - 12/21/2022  3:31 AM      Failed - Cr in normal range and within 360 days    Creat  Date Value Ref Range Status  09/28/2022 1.90 (H) 0.70 - 1.28 mg/dL Final   Creatinine, Urine  Date Value Ref Range Status  09/15/2022 52 20 - 320 mg/dL Final         Failed - Valid encounter within last 6 months    Recent Outpatient Visits           1 year ago Prediabetes   [facility]   1 year ago Skin lesion of right arm   [facility] Pickard, Priscille Heidelberg, MD   2 years ago Papular urticaria   [facility] Donita Brooks, MD   2 years ago Stage 3 chronic kidney disease, unspecified whether stage 3a or 3b CKD (HCC)   [facility] Donita Brooks, MD   2 years ago Stage 3 chronic kidney disease, unspecified whether stage 3a or 3b CKD (HCC)   [facility] Pickard, Priscille Heidelberg, MD              Passed - Last BP in normal range    BP Readings from Last 1 Encounters:  09/15/22 114/60         Passed - Last Heart Rate in normal range    Pulse Readings from Last 1 Encounters:  09/15/22 (!) 54

## 2022-12-25 ENCOUNTER — Telehealth: Payer: Self-pay | Admitting: Family Medicine

## 2022-12-25 NOTE — Telephone Encounter (Signed)
Outbound call placed to schedule Medicare AWV; patient declined at this time.

## 2022-12-25 NOTE — Telephone Encounter (Signed)
Reordered 12/22/22 #30 (it was a 30 day courtesy refill) pt needs to make appt acc. To notes.  Requested Prescriptions  Refused Prescriptions Disp Refills   atenolol (TENORMIN) 50 MG tablet [Pharmacy Med Name: ATENOLOL 50MG  TABLETS] 90 tablet     Sig: TAKE 1 TABLET(50 MG) BY MOUTH DAILY     Cardiovascular: Beta Blockers 2 Failed - 12/22/2022  9:17 AM      Failed - Cr in normal range and within 360 days    Creat  Date Value Ref Range Status  09/28/2022 1.90 (H) 0.70 - 1.28 mg/dL Final   Creatinine, Urine  Date Value Ref Range Status  09/15/2022 52 20 - 320 mg/dL Final         Failed - Valid encounter within last 6 months    Recent Outpatient Visits           1 year ago Prediabetes   [facility]   1 year ago Skin lesion of right arm   [facility] Pickard, Priscille Heidelberg, MD   2 years ago Papular urticaria   [facility] Donita Brooks, MD   2 years ago Stage 3 chronic kidney disease, unspecified whether stage 3a or 3b CKD (HCC)   [facility] Donita Brooks, MD   2 years ago Stage 3 chronic kidney disease, unspecified whether stage 3a or 3b CKD (HCC)   [facility] Pickard, Priscille Heidelberg, MD              Passed - Last BP in normal range    BP Readings from Last 1 Encounters:  09/15/22 114/60         Passed - Last Heart Rate in normal range    Pulse Readings from Last 1 Encounters:  09/15/22 (!) 54

## 2023-01-03 ENCOUNTER — Other Ambulatory Visit: Payer: Self-pay | Admitting: Family Medicine

## 2023-01-05 NOTE — Telephone Encounter (Signed)
Requested Prescriptions  Refused Prescriptions Disp Refills   atorvastatin (LIPITOR) 20 MG tablet [Pharmacy Med Name: ATORVASTATIN 20MG  TABLETS] 90 tablet 3    Sig: TAKE 1 TABLET(20 MG) BY MOUTH DAILY     Cardiovascular:  Antilipid - Statins Failed - 01/03/2023  8:05 AM      Failed - Valid encounter within last 12 months    Recent Outpatient Visits           1 year ago Prediabetes   [facility]   1 year ago Skin lesion of right arm   [facility] Pickard, Priscille Heidelberg, MD   2 years ago Papular urticaria   [facility] Donita Brooks, MD   2 years ago Stage 3 chronic kidney disease, unspecified whether stage 3a or 3b CKD (HCC)   [facility] Donita Brooks, MD   2 years ago Stage 3 chronic kidney disease, unspecified whether stage 3a or 3b CKD (HCC)   [facility] Donita Brooks, MD              Failed - Lipid Panel in normal range within the last 12 months    Cholesterol  Date Value Ref Range Status  09/15/2022 158 <200 mg/dL Final   LDL Cholesterol (Calc)  Date Value Ref Range Status  09/15/2022 83 mg/dL (calc) Final    Comment:    Reference range: <100 . Desirable range <100 mg/dL for primary prevention;   <70 mg/dL for patients with CHD or diabetic patients  with > or = 2 CHD risk factors. Marland Kitchen LDL-C is now calculated using the Martin-Hopkins  calculation, which is a validated novel method providing  better accuracy than the Friedewald equation in the  estimation of LDL-C.  Horald Pollen et al. Lenox Ahr. 1610;960(45): 2061-2068  (http://education.[facility].com/faq/FAQ164)    HDL  Date Value Ref Range Status  09/15/2022 50 > OR = 40 mg/dL Final   Triglycerides  Date Value Ref Range Status  09/15/2022 146 <150 mg/dL Final         Passed - Patient is not pregnant

## 2023-01-12 ENCOUNTER — Other Ambulatory Visit: Payer: Self-pay | Admitting: Family Medicine

## 2023-01-12 ENCOUNTER — Other Ambulatory Visit: Payer: Self-pay

## 2023-01-12 ENCOUNTER — Encounter: Payer: Self-pay | Admitting: Family Medicine

## 2023-01-12 DIAGNOSIS — I10 Essential (primary) hypertension: Secondary | ICD-10-CM

## 2023-01-12 MED ORDER — ATENOLOL 50 MG PO TABS: 50.0000 mg | ORAL_TABLET | Freq: Every day | ORAL | 0 refills | Status: DC

## 2023-01-25 ENCOUNTER — Other Ambulatory Visit: Payer: Self-pay

## 2023-01-25 ENCOUNTER — Encounter: Payer: Self-pay | Admitting: Family Medicine

## 2023-01-25 ENCOUNTER — Other Ambulatory Visit: Payer: Self-pay | Admitting: Family Medicine

## 2023-01-25 DIAGNOSIS — N183 Chronic kidney disease, stage 3 unspecified: Secondary | ICD-10-CM

## 2023-01-25 DIAGNOSIS — E118 Type 2 diabetes mellitus with unspecified complications: Secondary | ICD-10-CM

## 2023-01-25 DIAGNOSIS — K760 Fatty (change of) liver, not elsewhere classified: Secondary | ICD-10-CM

## 2023-01-25 MED ORDER — SEMAGLUTIDE (2 MG/DOSE) 8 MG/3ML ~~LOC~~ SOPN: 2.0000 mg | PEN_INJECTOR | SUBCUTANEOUS | 0 refills | Status: DC

## 2023-02-22 ENCOUNTER — Other Ambulatory Visit: Payer: Self-pay | Admitting: Family Medicine

## 2023-02-22 ENCOUNTER — Telehealth: Payer: Self-pay

## 2023-02-22 ENCOUNTER — Encounter: Payer: Self-pay | Admitting: Family Medicine

## 2023-02-22 DIAGNOSIS — E118 Type 2 diabetes mellitus with unspecified complications: Secondary | ICD-10-CM

## 2023-02-22 DIAGNOSIS — K760 Fatty (change of) liver, not elsewhere classified: Secondary | ICD-10-CM

## 2023-02-22 DIAGNOSIS — N183 Chronic kidney disease, stage 3 unspecified: Secondary | ICD-10-CM

## 2023-02-22 MED ORDER — SEMAGLUTIDE (2 MG/DOSE) 8 MG/3ML ~~LOC~~ SOPN: 2.0000 mg | PEN_INJECTOR | SUBCUTANEOUS | 0 refills | Status: DC

## 2023-02-22 NOTE — Telephone Encounter (Signed)
Copied from CRM [id number]. Topic: Clinical - Request for Lab/Test Order >> Feb 22, 2023  3:49 PM Bradley Baldwin wrote: Reason for CRM: patient would like A1c checked on feb 18th wanted to see if dr pickard would order the labs for him.

## 2023-02-23 ENCOUNTER — Other Ambulatory Visit: Payer: Self-pay | Admitting: Family Medicine

## 2023-02-23 DIAGNOSIS — E118 Type 2 diabetes mellitus with unspecified complications: Secondary | ICD-10-CM

## 2023-02-23 DIAGNOSIS — N183 Chronic kidney disease, stage 3 unspecified: Secondary | ICD-10-CM

## 2023-03-15 ENCOUNTER — Other Ambulatory Visit: Payer: Self-pay | Admitting: Family Medicine

## 2023-03-15 MED ORDER — OSELTAMIVIR PHOSPHATE 75 MG PO CAPS: 75.0000 mg | ORAL_CAPSULE | Freq: Two times a day (BID) | ORAL | 0 refills | Status: DC

## 2023-03-18 ENCOUNTER — Other Ambulatory Visit: Payer: Self-pay | Admitting: Family Medicine

## 2023-03-18 DIAGNOSIS — I10 Essential (primary) hypertension: Secondary | ICD-10-CM

## 2023-03-18 DIAGNOSIS — E118 Type 2 diabetes mellitus with unspecified complications: Secondary | ICD-10-CM

## 2023-03-18 DIAGNOSIS — N183 Chronic kidney disease, stage 3 unspecified: Secondary | ICD-10-CM

## 2023-03-18 DIAGNOSIS — K760 Fatty (change of) liver, not elsewhere classified: Secondary | ICD-10-CM

## 2023-03-19 ENCOUNTER — Other Ambulatory Visit: Payer: Self-pay | Admitting: Family Medicine

## 2023-03-19 DIAGNOSIS — I10 Essential (primary) hypertension: Secondary | ICD-10-CM

## 2023-03-20 ENCOUNTER — Other Ambulatory Visit: Payer: Medicare Other

## 2023-03-21 ENCOUNTER — Other Ambulatory Visit: Payer: Medicare Other

## 2023-03-26 ENCOUNTER — Other Ambulatory Visit: Payer: Medicare Other

## 2023-04-10 ENCOUNTER — Other Ambulatory Visit: Payer: Self-pay | Admitting: Family Medicine

## 2023-04-10 DIAGNOSIS — I10 Essential (primary) hypertension: Secondary | ICD-10-CM

## 2023-04-11 ENCOUNTER — Other Ambulatory Visit: Payer: Self-pay | Admitting: Family Medicine

## 2023-04-11 ENCOUNTER — Encounter: Payer: Self-pay | Admitting: Family Medicine

## 2023-04-11 ENCOUNTER — Other Ambulatory Visit: Payer: Self-pay

## 2023-04-11 DIAGNOSIS — N183 Chronic kidney disease, stage 3 unspecified: Secondary | ICD-10-CM

## 2023-04-11 DIAGNOSIS — E118 Type 2 diabetes mellitus with unspecified complications: Secondary | ICD-10-CM

## 2023-04-11 DIAGNOSIS — K760 Fatty (change of) liver, not elsewhere classified: Secondary | ICD-10-CM

## 2023-04-11 MED ORDER — ATORVASTATIN CALCIUM 20 MG PO TABS: 20.0000 mg | ORAL_TABLET | Freq: Every day | ORAL | 3 refills | Status: DC

## 2023-04-11 MED ORDER — OZEMPIC (2 MG/DOSE) 8 MG/3ML ~~LOC~~ SOPN: 2.0000 mg | PEN_INJECTOR | SUBCUTANEOUS | 0 refills | Status: DC

## 2023-04-11 NOTE — Telephone Encounter (Signed)
 Requested medication (s) are due for refill today: yes  Requested medication (s) are on the active medication list: yes  Last refill:  03/14/22 #90  Future visit scheduled: yes  Notes to clinic:  abnormal lab work    Requested Prescriptions  Pending Prescriptions Disp Refills   atenolol (TENORMIN) 50 MG tablet [Pharmacy Med Name: ATENOLOL 50MG  TABLETS] 90 tablet 0    Sig: TAKE 1 TABLET(50 MG) BY MOUTH DAILY     Cardiovascular: Beta Blockers 2 Failed - 04/11/2023  8:17 AM      Failed - Cr in normal range and within 360 days    Creat  Date Value Ref Range Status  09/28/2022 1.90 (H) 0.70 - 1.28 mg/dL Final   Creatinine, Urine  Date Value Ref Range Status  09/15/2022 52 20 - 320 mg/dL Final         Failed - Valid encounter within last 6 months    Recent Outpatient Visits           1 year ago Prediabetes   [facility]   2 years ago Skin lesion of right arm   [facility] Pickard, Priscille Heidelberg, MD   2 years ago Papular urticaria   [facility] Donita Brooks, MD   2 years ago Stage 3 chronic kidney disease, unspecified whether stage 3a or 3b CKD (HCC)   [facility] Donita Brooks, MD   2 years ago Stage 3 chronic kidney disease, unspecified whether stage 3a or 3b CKD (HCC)   [facility] Pickard, Priscille Heidelberg, MD              Passed - Last BP in normal range    BP Readings from Last 1 Encounters:  09/15/22 114/60         Passed - Last Heart Rate in normal range    Pulse Readings from Last 1 Encounters:  09/15/22 (!) 54

## 2023-04-15 ENCOUNTER — Other Ambulatory Visit: Payer: Self-pay | Admitting: Family Medicine

## 2023-04-15 DIAGNOSIS — I10 Essential (primary) hypertension: Secondary | ICD-10-CM

## 2023-04-17 ENCOUNTER — Other Ambulatory Visit: Payer: Self-pay

## 2023-04-17 DIAGNOSIS — I10 Essential (primary) hypertension: Secondary | ICD-10-CM

## 2023-04-17 MED ORDER — LISINOPRIL 40 MG PO TABS: 40.0000 mg | ORAL_TABLET | Freq: Every day | ORAL | 0 refills | Status: DC

## 2023-04-17 NOTE — Telephone Encounter (Signed)
 Patient must keep upcoming appointment for additional refills. Requested Prescriptions  Pending Prescriptions Disp Refills   amLODipine (NORVASC) 10 MG tablet [Pharmacy Med Name: AMLODIPINE BESYLATE 10MG  TABLETS] 30 tablet 0    Sig: TAKE 1 TABLET(10 MG) BY MOUTH DAILY     Cardiovascular: Calcium Channel Blockers 2 Failed - 04/17/2023  8:34 AM      Failed - Valid encounter within last 6 months    Recent Outpatient Visits           1 year ago Prediabetes   [facility]   2 years ago Skin lesion of right arm   [facility] Pickard, Priscille Heidelberg, MD   2 years ago Papular urticaria   [facility] Donita Brooks, MD   2 years ago Stage 3 chronic kidney disease, unspecified whether stage 3a or 3b CKD (HCC)   [facility] Donita Brooks, MD   2 years ago Stage 3 chronic kidney disease, unspecified whether stage 3a or 3b CKD (HCC)   [facility] Pickard, Priscille Heidelberg, MD              Passed - Last BP in normal range    BP Readings from Last 1 Encounters:  09/15/22 114/60         Passed - Last Heart Rate in normal range    Pulse Readings from Last 1 Encounters:  09/15/22 (!) 54          lisinopril (ZESTRIL) 40 MG tablet [Pharmacy Med Name: LISINOPRIL 40MG  TABLETS] 30 tablet 0    Sig: TAKE 1 TABLET(40 MG) BY MOUTH DAILY     Cardiovascular:  ACE Inhibitors Failed - 04/17/2023  8:34 AM      Failed - Cr in normal range and within 180 days    Creat  Date Value Ref Range Status  09/28/2022 1.90 (H) 0.70 - 1.28 mg/dL Final   Creatinine, Urine  Date Value Ref Range Status  09/15/2022 52 20 - 320 mg/dL Final         Failed - K in normal range and within 180 days    Potassium  Date Value Ref Range Status  09/28/2022 5.2 3.5 - 5.3 mmol/L Final         Failed - Valid encounter within last 6 months    Recent Outpatient Visits           1 year ago Prediabetes   [facility]   2 years  ago Skin lesion of right arm   [facility] Pickard, Priscille Heidelberg, MD   2 years ago Papular urticaria   [facility] Donita Brooks, MD   2 years ago Stage 3 chronic kidney disease, unspecified whether stage 3a or 3b CKD (HCC)   [facility] Donita Brooks, MD   2 years ago Stage 3 chronic kidney disease, unspecified whether stage 3a or 3b CKD (HCC)   [facility] Pickard, Priscille Heidelberg, MD              Passed - Patient is not pregnant      Passed - Last BP in normal range    BP Readings from Last 1 Encounters:  09/15/22 114/60

## 2023-05-08 ENCOUNTER — Ambulatory Visit: Admitting: Family Medicine

## 2023-05-08 ENCOUNTER — Encounter: Payer: Self-pay | Admitting: Family Medicine

## 2023-05-08 VITALS — BP 112/64 | HR 68 | Temp 97.8°F | Ht 64.0 in | Wt 161.0 lb

## 2023-05-08 DIAGNOSIS — Z7984 Long term (current) use of oral hypoglycemic drugs: Secondary | ICD-10-CM

## 2023-05-08 DIAGNOSIS — E118 Type 2 diabetes mellitus with unspecified complications: Secondary | ICD-10-CM

## 2023-05-08 NOTE — Progress Notes (Signed)
 Wt Readings from Last 3 Encounters:  05/08/23 161 lb (73 kg)  09/15/22 173 lb 3.2 oz (78.6 kg)  03/06/22 176 lb (79.8 kg)     Subjective:    Patient ID: Bradley Baldwin, male    DOB: February 12, 1950, 73 y.o.   MRN: [id number]  HPI Patient has history of stage 3 ckd, dm2, and nafld.  Doing well. BP has been 100-120/50-70.  Denies cp, sob, doe.   Has lost 12 lbs since last ov.  Denies n/v/d.  Denies abd pain.  Past Medical History:  Diagnosis Date   Arthritis    "starting to get achy joints here and there" (11/28/2011)   CKD (chronic kidney disease), stage III (HCC)    proteinuria 750 mg a day   Colon polyps    Diabetes mellitus without complication (HCC)    External bleeding hemorrhoids    "occasionally" (11/28/2011)   Fatty liver disease, nonalcoholic    GERD (gastroesophageal reflux disease)    Hypertension     Past Surgical History:  Procedure Laterality Date   TOTAL HIP ARTHROPLASTY  11/28/2011   right   TOTAL HIP ARTHROPLASTY  11/28/2011   Procedure: TOTAL HIP ARTHROPLASTY ANTERIOR APPROACH;  Surgeon: Velna Ochs, MD;  Location: [facility] OR;  Service: Orthopedics;  Laterality: Right;   Current Outpatient Medications on File Prior to Visit  Medication Sig Dispense Refill   amLODipine (NORVASC) 10 MG tablet TAKE 1 TABLET(10 MG) BY MOUTH DAILY 30 tablet 0   aspirin EC 81 MG tablet Take 81 mg by mouth daily.     atenolol (TENORMIN) 50 MG tablet TAKE 1 TABLET(50 MG) BY MOUTH DAILY 90 tablet 0   atorvastatin (LIPITOR) 20 MG tablet Take 1 tablet (20 mg total) by mouth daily. 90 tablet 3   Cetirizine HCl (ZYRTEC ALLERGY) 10 MG TBDP Take 10 mg by mouth at bedtime. 30 tablet 0   FARXIGA 10 MG TABS tablet TAKE 1 TABLET(10 MG) BY MOUTH DAILY 90 tablet 0   fluticasone (FLONASE) 50 MCG/ACT nasal spray Place 2 sprays into the nose daily. (Patient taking differently: Place 2 sprays into the nose daily. PRN) 16 g 6   glucose blood test strip Use as directed to monitor FSBS 2x daily. Dx E11.9.  100 each 12   hydrochlorothiazide (MICROZIDE) 12.5 MG capsule TAKE 1 CAPSULE(12.5 MG) BY MOUTH DAILY 90 capsule 1   hydrOXYzine (ATARAX/VISTARIL) 25 MG tablet TAKE 1 TABLET(25 MG) BY MOUTH AT BEDTIME AS NEEDED 90 tablet 1   Lancets (ONETOUCH DELICA PLUS LANCET33G) MISC USE AS DIRECTED TO MONITOR FASTING BLOOD SUGAR TWICE DAILY 100 each 12   lisinopril (ZESTRIL) 40 MG tablet Take 1 tablet (40 mg total) by mouth daily. (Patient taking differently: Take 40 mg by mouth daily. Pt is taking 20 mg per day-05/08/23.) 90 tablet 0   Semaglutide, 2 MG/DOSE, (OZEMPIC, 2 MG/DOSE,) 8 MG/3ML SOPN Inject 2 mg into the skin once a week. 3 mL 0   sildenafil (VIAGRA) 100 MG tablet Take 0.5-1 tablets (50-100 mg total) by mouth daily as needed for erectile dysfunction. 5 tablet 11   triamcinolone cream (KENALOG) 0.1 % Apply 1 application topically 2 (two) times daily. 30 g 0   No current facility-administered medications on file prior to visit.   No Known Allergies Social History   Socioeconomic History   Marital status: Married    Spouse name: Not on file   Number of children: Not on file   Years of education: Not on file  Highest education level: Not on file  Occupational History   Not on file  Tobacco Use   Smoking status: Former    Current packs/day: 2.00    Average packs/day: 2.0 packs/day for 10.0 years (20.0 ttl pk-yrs)    Types: Cigarettes   Smokeless tobacco: Former    Types: Chew   Tobacco comments:    11/28/2011 "quit smoking ~ 30 yr ago; chewed some then quit that"  Substance and Sexual Activity   Alcohol use: Yes    Alcohol/week: 6.0 standard drinks of alcohol    Types: 6 Cans of beer per week    Comment: 11/28/2011 "drink a few beers 3 times/wk; ~ 6pk total/wk"   Drug use: No   Sexual activity: Yes  Other Topics Concern   Not on file  Social History Narrative   Not on file   Social Drivers of Health   Financial Resource Strain: Low Risk  (07/16/2020)   Overall Financial Resource  Strain (CARDIA)    Difficulty of Paying Living Expenses: Not hard at all  Food Insecurity: No Food Insecurity (07/16/2020)   Hunger Vital Sign    Worried About Running Out of Food in the Last Year: Never true    Ran Out of Food in the Last Year: Never true  Transportation Needs: No Transportation Needs (07/16/2020)   PRAPARE - Administrator, Civil Service (Medical): No    Lack of Transportation (Non-Medical): No  Physical Activity: Inactive (07/16/2020)   Exercise Vital Sign    Days of Exercise per Week: 0 days    Minutes of Exercise per Session: 0 min  Stress: No Stress Concern Present (07/16/2020)   [employer] of Occupational Health - Occupational Stress Questionnaire    Feeling of Stress : Not at all  Social Connections: Moderately Isolated (07/16/2020)   Social Connection and Isolation Panel [NHANES]    Frequency of Communication with Friends and Family: Twice a week    Frequency of Social Gatherings with Friends and Family: Once a week    Attends Religious Services: Never    Database administrator or Organizations: No    Attends Banker Meetings: Never    Marital Status: Married  Catering manager Violence: Not At Risk (07/16/2020)   Humiliation, Afraid, Rape, and Kick questionnaire    Fear of Current or Ex-Partner: No    Emotionally Abused: No    Physically Abused: No    Sexually Abused: No     Review of Systems     Objective:   Physical Exam Vitals reviewed.  Cardiovascular:     Rate and Rhythm: Normal rate and regular rhythm.     Heart sounds: Normal heart sounds. No murmur heard. Pulmonary:     Effort: Pulmonary effort is normal. No respiratory distress.     Breath sounds: Normal breath sounds. No stridor.           Assessment & Plan:  Controlled type 2 diabetes mellitus with complication, without long-term current use of insulin (HCC) - Plan: Hemoglobin A1c, CBC with Differential/Platelet, Lipid panel, COMPLETE METABOLIC PANEL  WITHOUT GFR, Microalbumin/Creatinine Ratio, Urine I am extremely happy with his blood pressure.  It is well-controlled.  Discontinue hydrochlorothiazide to avoid prerenal azotemia.  Check fasting labs, goal A1c is less than 6.5.  goal ldl is less than 100.

## 2023-05-09 LAB — CBC WITH DIFFERENTIAL/PLATELET
Absolute Lymphocytes: 3297 {cells}/uL (ref 850–3900)
Absolute Monocytes: 1134 {cells}/uL — ABNORMAL HIGH (ref 200–950)
Basophils Absolute: 62 {cells}/uL (ref 0–200)
Basophils Relative: 0.6 %
Eosinophils Absolute: 260 {cells}/uL (ref 15–500)
Eosinophils Relative: 2.5 %
HCT: 40.1 % (ref 38.5–50.0)
Hemoglobin: 13.7 g/dL (ref 13.2–17.1)
MCH: 34.2 pg — ABNORMAL HIGH (ref 27.0–33.0)
MCHC: 34.2 g/dL (ref 32.0–36.0)
MCV: 100 fL (ref 80.0–100.0)
MPV: 10.1 fL (ref 7.5–12.5)
Monocytes Relative: 10.9 %
Neutro Abs: 5647 {cells}/uL (ref 1500–7800)
Neutrophils Relative %: 54.3 %
Platelets: 227 10*3/uL (ref 140–400)
RBC: 4.01 10*6/uL — ABNORMAL LOW (ref 4.20–5.80)
RDW: 11.7 % (ref 11.0–15.0)
Total Lymphocyte: 31.7 %
WBC: 10.4 10*3/uL (ref 3.8–10.8)

## 2023-05-09 LAB — COMPLETE METABOLIC PANEL WITHOUT GFR
AG Ratio: 1.7 (calc) (ref 1.0–2.5)
ALT: 48 U/L — ABNORMAL HIGH (ref 9–46)
AST: 46 U/L — ABNORMAL HIGH (ref 10–35)
Albumin: 4.6 g/dL (ref 3.6–5.1)
Alkaline phosphatase (APISO): 85 U/L (ref 35–144)
BUN/Creatinine Ratio: 17 (calc) (ref 6–22)
BUN: 29 mg/dL — ABNORMAL HIGH (ref 7–25)
CO2: 25 mmol/L (ref 20–32)
Calcium: 9.6 mg/dL (ref 8.6–10.3)
Chloride: 99 mmol/L (ref 98–110)
Creat: 1.74 mg/dL — ABNORMAL HIGH (ref 0.70–1.28)
Globulin: 2.7 g/dL (ref 1.9–3.7)
Glucose, Bld: 121 mg/dL — ABNORMAL HIGH (ref 65–99)
Potassium: 4.3 mmol/L (ref 3.5–5.3)
Sodium: 135 mmol/L (ref 135–146)
Total Bilirubin: 0.9 mg/dL (ref 0.2–1.2)
Total Protein: 7.3 g/dL (ref 6.1–8.1)

## 2023-05-09 LAB — LIPID PANEL
Cholesterol: 118 mg/dL (ref <200)
HDL: 49 mg/dL (ref >=40)
LDL Cholesterol (Calc): 49 mg/dL
Non-HDL Cholesterol (Calc): 69 mg/dL (ref <130)
Total CHOL/HDL Ratio: 2.4 (calc) (ref <5.0)
Triglycerides: 112 mg/dL (ref <150)

## 2023-05-09 LAB — HEMOGLOBIN A1C
Hgb A1c MFr Bld: 5.9 %{Hb} — ABNORMAL HIGH (ref <5.7)
Mean Plasma Glucose: 123 mg/dL
eAG (mmol/L): 6.8 mmol/L

## 2023-05-09 LAB — MICROALBUMIN / CREATININE URINE RATIO
Creatinine, Urine: 69 mg/dL (ref 20–320)
Microalb Creat Ratio: 414 mg/g{creat} — ABNORMAL HIGH (ref <30)
Microalb, Ur: 28.6 mg/dL

## 2023-05-11 ENCOUNTER — Other Ambulatory Visit: Payer: Self-pay | Admitting: Family Medicine

## 2023-05-11 ENCOUNTER — Other Ambulatory Visit: Payer: Self-pay

## 2023-05-11 ENCOUNTER — Encounter: Payer: Self-pay | Admitting: Family Medicine

## 2023-05-11 DIAGNOSIS — E118 Type 2 diabetes mellitus with unspecified complications: Secondary | ICD-10-CM

## 2023-05-11 DIAGNOSIS — K760 Fatty (change of) liver, not elsewhere classified: Secondary | ICD-10-CM

## 2023-05-11 DIAGNOSIS — N183 Chronic kidney disease, stage 3 unspecified: Secondary | ICD-10-CM

## 2023-05-11 MED ORDER — OZEMPIC (2 MG/DOSE) 8 MG/3ML ~~LOC~~ SOPN: 2.0000 mg | PEN_INJECTOR | SUBCUTANEOUS | 0 refills | Status: DC

## 2023-05-14 NOTE — Telephone Encounter (Signed)
 Requested Prescriptions  Refused Prescriptions Disp Refills   OZEMPIC, 2 MG/DOSE, 8 MG/3ML SOPN [Pharmacy Med Name: OZEMPIC 2MG  PER DOSE (8MG /3ML) PFP] 3 mL 0    Sig: INJECT 2 MG INTO THE SKIN ONCE A WEEK     Endocrinology:  Diabetes - GLP-1 Receptor Agonists - semaglutide Failed - 05/14/2023  7:47 AM      Failed - HBA1C in normal range and within 180 days    Hgb A1c MFr Bld  Date Value Ref Range Status  05/08/2023 5.9 (H) <5.7 % of total Hgb Final    Comment:    For someone without known diabetes, a hemoglobin  A1c value between 5.7% and 6.4% is consistent with prediabetes and should be confirmed with a  follow-up test. . For someone with known diabetes, a value <7% indicates that their diabetes is well controlled. A1c targets should be individualized based on duration of diabetes, age, comorbid conditions, and other considerations. . This assay result is consistent with an increased risk of diabetes. . Currently, no consensus exists regarding use of hemoglobin A1c for diagnosis of diabetes for children. .          Failed - Cr in normal range and within 360 days    Creat  Date Value Ref Range Status  05/08/2023 1.74 (H) 0.70 - 1.28 mg/dL Final   Creatinine, Urine  Date Value Ref Range Status  05/08/2023 69 20 - 320 mg/dL Final         Failed - Valid encounter within last 6 months    Recent Outpatient Visits           6 days ago Controlled type 2 diabetes mellitus with complication, without long-term current use of insulin (HCC)   [facility] Austine Lefort, MD   8 months ago Controlled type 2 diabetes mellitus with complication, without long-term current use of insulin [facility])   [place] [facility] Pickard, Cisco Crest, MD   1 year ago Benign essential HTN   [facility] Pickard, Cisco Crest, MD

## 2023-05-21 ENCOUNTER — Other Ambulatory Visit: Payer: Self-pay | Admitting: Family Medicine

## 2023-05-21 DIAGNOSIS — E118 Type 2 diabetes mellitus with unspecified complications: Secondary | ICD-10-CM

## 2023-05-21 DIAGNOSIS — N183 Chronic kidney disease, stage 3 unspecified: Secondary | ICD-10-CM

## 2023-05-21 NOTE — Telephone Encounter (Signed)
 Requested Prescriptions  Pending Prescriptions Disp Refills   FARXIGA  10 MG TABS tablet [Pharmacy Med Name: FARXIGA  10MG  TABLETS] 90 tablet 0    Sig: TAKE 1 TABLET(10 MG) BY MOUTH DAILY     Endocrinology:  Diabetes - SGLT2 Inhibitors Failed - 05/21/2023  4:57 PM      Failed - Cr in normal range and within 360 days    Creat  Date Value Ref Range Status  05/08/2023 1.74 (H) 0.70 - 1.28 mg/dL Final   Creatinine, Urine  Date Value Ref Range Status  05/08/2023 69 20 - 320 mg/dL Final         Failed - eGFR in normal range and within 360 days    GFR, Est African American  Date Value Ref Range Status  06/16/2020 48 (L) > OR = 60 mL/min/1.53m2 Final   GFR, Est Non African American  Date Value Ref Range Status  06/16/2020 42 (L) > OR = 60 mL/min/1.42m2 Final   eGFR  Date Value Ref Range Status  09/15/2022 43 (L) > OR = 60 mL/min/1.30m2 Final         Failed - Valid encounter within last 6 months    Recent Outpatient Visits           1 week ago Controlled type 2 diabetes mellitus with complication, without long-term current use of insulin (HCC)   [place] [facility] Austine Lefort, MD   8 months ago Controlled type 2 diabetes mellitus with complication, without long-term current use of insulin (HCC)   [place] [facility] Pickard, Cisco Crest, MD   1 year ago Benign essential HTN   [place] [facility] Pickard, Cisco Crest, MD              Passed - HBA1C is between 0 and 7.9 and within 180 days    Hgb A1c MFr Bld  Date Value Ref Range Status  05/08/2023 5.9 (H) <5.7 % of total Hgb Final    Comment:    For someone without known diabetes, a hemoglobin  A1c value between 5.7% and 6.4% is consistent with prediabetes and should be confirmed with a  follow-up test. . For someone with known diabetes, a value <7% indicates that their diabetes is well controlled. A1c targets should be individualized based on duration  of diabetes, age, comorbid conditions, and other considerations. . This assay result is consistent with an increased risk of diabetes. . Currently, no consensus exists regarding use of hemoglobin A1c for diagnosis of diabetes for children. Aaron Aas

## 2023-05-22 ENCOUNTER — Other Ambulatory Visit: Payer: Self-pay

## 2023-05-22 ENCOUNTER — Encounter: Payer: Self-pay | Admitting: Family Medicine

## 2023-05-22 DIAGNOSIS — I10 Essential (primary) hypertension: Secondary | ICD-10-CM

## 2023-05-22 DIAGNOSIS — E118 Type 2 diabetes mellitus with unspecified complications: Secondary | ICD-10-CM

## 2023-05-22 DIAGNOSIS — N183 Chronic kidney disease, stage 3 unspecified: Secondary | ICD-10-CM

## 2023-05-22 MED ORDER — AMLODIPINE BESYLATE 10 MG PO TABS: 10.0000 mg | ORAL_TABLET | Freq: Every day | ORAL | 2 refills | Status: DC

## 2023-05-22 MED ORDER — DAPAGLIFLOZIN PROPANEDIOL 10 MG PO TABS: 10.0000 mg | ORAL_TABLET | Freq: Every day | ORAL | 2 refills | Status: AC

## 2023-05-23 ENCOUNTER — Other Ambulatory Visit: Payer: Self-pay | Admitting: Family Medicine

## 2023-05-23 DIAGNOSIS — I10 Essential (primary) hypertension: Secondary | ICD-10-CM

## 2023-05-23 NOTE — Telephone Encounter (Signed)
 Duplicate request- filled 05/22/23 Requested Prescriptions  Pending Prescriptions Disp Refills   amLODipine  (NORVASC ) 10 MG tablet [Pharmacy Med Name: AMLODIPINE  BESYLATE 10MG  TABLETS] 90 tablet 2    Sig: TAKE 1 TABLET(10 MG) BY MOUTH DAILY     Cardiovascular: Calcium  Channel Blockers 2 Failed - 05/23/2023  3:28 PM      Failed - Valid encounter within last 6 months    Recent Outpatient Visits           2 weeks ago Controlled type 2 diabetes mellitus with complication, without long-term current use of insulin (HCC)   [place] [facility] Austine Lefort, MD   8 months ago Controlled type 2 diabetes mellitus with complication, without long-term current use of insulin [facility])   [facility] [facility] Pickard, Cisco Crest, MD   1 year ago Benign essential HTN   [place] [facility] Pickard, Cisco Crest, MD              Passed - Last BP in normal range    BP Readings from Last 1 Encounters:  05/08/23 112/64         Passed - Last Heart Rate in normal range    Pulse Readings from Last 1 Encounters:  05/08/23 68

## 2023-05-26 ENCOUNTER — Other Ambulatory Visit: Payer: Self-pay | Admitting: Family Medicine

## 2023-05-26 DIAGNOSIS — E118 Type 2 diabetes mellitus with unspecified complications: Secondary | ICD-10-CM

## 2023-05-26 DIAGNOSIS — N183 Chronic kidney disease, stage 3 unspecified: Secondary | ICD-10-CM

## 2023-05-28 NOTE — Telephone Encounter (Signed)
 Duplicate request, refill too soon.  Requested Prescriptions  Pending Prescriptions Disp Refills   FARXIGA  10 MG TABS tablet [Pharmacy Med Name: FARXIGA  10MG  TABLETS] 90 tablet 2    Sig: TAKE 1 TABLET(10 MG) BY MOUTH DAILY     Endocrinology:  Diabetes - SGLT2 Inhibitors Failed - 05/28/2023 11:39 AM      Failed - Cr in normal range and within 360 days    Creat  Date Value Ref Range Status  05/08/2023 1.74 (H) 0.70 - 1.28 mg/dL Final   Creatinine, Urine  Date Value Ref Range Status  05/08/2023 69 20 - 320 mg/dL Final         Failed - eGFR in normal range and within 360 days    GFR, Est African American  Date Value Ref Range Status  06/16/2020 48 (L) > OR = 60 mL/min/1.22m2 Final   GFR, Est Non African American  Date Value Ref Range Status  06/16/2020 42 (L) > OR = 60 mL/min/1.56m2 Final   eGFR  Date Value Ref Range Status  09/15/2022 43 (L) > OR = 60 mL/min/1.39m2 Final         Failed - Valid encounter within last 6 months    Recent Outpatient Visits           2 weeks ago Controlled type 2 diabetes mellitus with complication, without long-term current use of insulin (HCC)   [place] [facility] Austine Lefort, MD   8 months ago Controlled type 2 diabetes mellitus with complication, without long-term current use of insulin (HCC)   [place] [facility] Pickard, Cisco Crest, MD   1 year ago Benign essential HTN   [place] [facility] Pickard, Cisco Crest, MD              Passed - HBA1C is between 0 and 7.9 and within 180 days    Hgb A1c MFr Bld  Date Value Ref Range Status  05/08/2023 5.9 (H) <5.7 % of total Hgb Final    Comment:    For someone without known diabetes, a hemoglobin  A1c value between 5.7% and 6.4% is consistent with prediabetes and should be confirmed with a  follow-up test. . For someone with known diabetes, a value <7% indicates that their diabetes is well controlled. A1c targets should  be individualized based on duration of diabetes, age, comorbid conditions, and other considerations. . This assay result is consistent with an increased risk of diabetes. . Currently, no consensus exists regarding use of hemoglobin A1c for diagnosis of diabetes for children. Aaron Aas

## 2023-05-29 ENCOUNTER — Other Ambulatory Visit: Payer: Self-pay

## 2023-05-29 DIAGNOSIS — I10 Essential (primary) hypertension: Secondary | ICD-10-CM

## 2023-06-01 ENCOUNTER — Other Ambulatory Visit: Payer: Self-pay | Admitting: Family Medicine

## 2023-06-01 DIAGNOSIS — I10 Essential (primary) hypertension: Secondary | ICD-10-CM

## 2023-06-01 NOTE — Telephone Encounter (Signed)
 Prescription Request  06/01/2023  LOV: 05/08/2023  What is the name of the medication or equipment? amLODipine  (NORVASC ) 10 MG tablet   Have you contacted your pharmacy to request a refill? Yes   Which pharmacy would you like this sent to?  [id number] - [place], [place] - [address] AT [facility] [id number] & [address] [place] [address] Phone: [phone] Fax: [phone]    Patient notified that their request is being sent to the clinical staff for review and that they should receive a response within 2 business days.   Please advise at [facility] [phone]

## 2023-06-03 ENCOUNTER — Other Ambulatory Visit: Payer: Self-pay | Admitting: Family Medicine

## 2023-06-03 DIAGNOSIS — N183 Chronic kidney disease, stage 3 unspecified: Secondary | ICD-10-CM

## 2023-06-03 DIAGNOSIS — E118 Type 2 diabetes mellitus with unspecified complications: Secondary | ICD-10-CM

## 2023-06-03 DIAGNOSIS — K760 Fatty (change of) liver, not elsewhere classified: Secondary | ICD-10-CM

## 2023-06-05 NOTE — Telephone Encounter (Signed)
 Duplicate request, 05/22/23 for 90 and 2 RF.  Requested Prescriptions  Pending Prescriptions Disp Refills   amLODipine  (NORVASC ) 10 MG tablet 90 tablet 2    Sig: Take 1 tablet (10 mg total) by mouth daily.     Cardiovascular: Calcium  Channel Blockers 2 Failed - 06/05/2023  8:44 AM      Failed - Valid encounter within last 6 months    Recent Outpatient Visits           4 weeks ago Controlled type 2 diabetes mellitus with complication, without long-term current use of insulin (HCC)   [place] [facility] Austine Lefort, MD   8 months ago Controlled type 2 diabetes mellitus with complication, without long-term current use of insulin [facility])   [place] [facility] Pickard, Cisco Crest, MD   1 year ago Benign essential HTN   [place] [facility] Pickard, Cisco Crest, MD              Passed - Last BP in normal range    BP Readings from Last 1 Encounters:  05/08/23 112/64         Passed - Last Heart Rate in normal range    Pulse Readings from Last 1 Encounters:  05/08/23 68

## 2023-06-05 NOTE — Telephone Encounter (Signed)
 Requested Prescriptions  Pending Prescriptions Disp Refills   OZEMPIC , 2 MG/DOSE, 8 MG/3ML SOPN [Pharmacy Med Name: OZEMPIC  2MG  PER DOSE (8MG /3ML) PFP] 3 mL 0    Sig: INJECT 2MG  UNDER THE SKIN ONCE A WEEK     Endocrinology:  Diabetes - GLP-1 Receptor Agonists - semaglutide  Failed - 06/05/2023  2:56 PM      Failed - HBA1C in normal range and within 180 days    Hgb A1c MFr Bld  Date Value Ref Range Status  05/08/2023 5.9 (H) <5.7 % of total Hgb Final    Comment:    For someone without known diabetes, a hemoglobin  A1c value between 5.7% and 6.4% is consistent with prediabetes and should be confirmed with a  follow-up test. . For someone with known diabetes, a value <7% indicates that their diabetes is well controlled. A1c targets should be individualized based on duration of diabetes, age, comorbid conditions, and other considerations. . This assay result is consistent with an increased risk of diabetes. . Currently, no consensus exists regarding use of hemoglobin A1c for diagnosis of diabetes for children. .          Failed - Cr in normal range and within 360 days    Creat  Date Value Ref Range Status  05/08/2023 1.74 (H) 0.70 - 1.28 mg/dL Final   Creatinine, Urine  Date Value Ref Range Status  05/08/2023 69 20 - 320 mg/dL Final         Failed - Valid encounter within last 6 months    Recent Outpatient Visits           4 weeks ago Controlled type 2 diabetes mellitus with complication, without long-term current use of insulin (HCC)   [place] [facility] Austine Lefort, MD   8 months ago Controlled type 2 diabetes mellitus with complication, without long-term current use of insulin [facility])   [place] [facility] Pickard, Cisco Crest, MD   1 year ago Benign essential HTN   [place] [facility] Pickard, Cisco Crest, MD

## 2023-07-07 ENCOUNTER — Other Ambulatory Visit: Payer: Self-pay | Admitting: Family Medicine

## 2023-07-07 DIAGNOSIS — K760 Fatty (change of) liver, not elsewhere classified: Secondary | ICD-10-CM

## 2023-07-07 DIAGNOSIS — N183 Chronic kidney disease, stage 3 unspecified: Secondary | ICD-10-CM

## 2023-07-07 DIAGNOSIS — E118 Type 2 diabetes mellitus with unspecified complications: Secondary | ICD-10-CM

## 2023-07-09 ENCOUNTER — Other Ambulatory Visit: Payer: Self-pay | Admitting: Family Medicine

## 2023-07-09 DIAGNOSIS — I10 Essential (primary) hypertension: Secondary | ICD-10-CM

## 2023-07-25 ENCOUNTER — Other Ambulatory Visit: Payer: Self-pay | Admitting: Family Medicine

## 2023-07-25 DIAGNOSIS — I10 Essential (primary) hypertension: Secondary | ICD-10-CM

## 2023-08-04 ENCOUNTER — Other Ambulatory Visit: Payer: Self-pay | Admitting: Family Medicine

## 2023-08-04 DIAGNOSIS — N183 Chronic kidney disease, stage 3 unspecified: Secondary | ICD-10-CM

## 2023-08-04 DIAGNOSIS — K760 Fatty (change of) liver, not elsewhere classified: Secondary | ICD-10-CM

## 2023-08-04 DIAGNOSIS — E118 Type 2 diabetes mellitus with unspecified complications: Secondary | ICD-10-CM

## 2023-08-23 ENCOUNTER — Other Ambulatory Visit: Payer: Self-pay | Admitting: Family Medicine

## 2023-08-23 DIAGNOSIS — I10 Essential (primary) hypertension: Secondary | ICD-10-CM

## 2023-09-03 ENCOUNTER — Other Ambulatory Visit: Payer: Self-pay | Admitting: Family Medicine

## 2023-09-03 ENCOUNTER — Encounter: Payer: Self-pay | Admitting: Family Medicine

## 2023-09-03 ENCOUNTER — Other Ambulatory Visit: Payer: Self-pay

## 2023-09-03 DIAGNOSIS — K760 Fatty (change of) liver, not elsewhere classified: Secondary | ICD-10-CM

## 2023-09-03 DIAGNOSIS — E118 Type 2 diabetes mellitus with unspecified complications: Secondary | ICD-10-CM

## 2023-09-03 DIAGNOSIS — N183 Chronic kidney disease, stage 3 unspecified: Secondary | ICD-10-CM

## 2023-09-03 MED ORDER — OZEMPIC (2 MG/DOSE) 8 MG/3ML ~~LOC~~ SOPN: 2.0000 mg | PEN_INJECTOR | SUBCUTANEOUS | 1 refills | Status: AC

## 2023-09-14 ENCOUNTER — Ambulatory Visit: Admitting: Family Medicine

## 2023-09-26 ENCOUNTER — Encounter (INDEPENDENT_AMBULATORY_CARE_PROVIDER_SITE_OTHER): Payer: Self-pay | Admitting: Otolaryngology

## 2023-09-26 ENCOUNTER — Ambulatory Visit (INDEPENDENT_AMBULATORY_CARE_PROVIDER_SITE_OTHER): Payer: Medicare Other | Admitting: Otolaryngology

## 2023-09-26 VITALS — BP 136/75 | HR 64

## 2023-09-26 DIAGNOSIS — H9313 Tinnitus, bilateral: Secondary | ICD-10-CM | POA: Diagnosis not present

## 2023-09-26 DIAGNOSIS — R42 Dizziness and giddiness: Secondary | ICD-10-CM

## 2023-09-26 DIAGNOSIS — H903 Sensorineural hearing loss, bilateral: Secondary | ICD-10-CM

## 2023-09-26 NOTE — Progress Notes (Unsigned)
 Patient ID: Bradley Baldwin, male   DOB: Feb 26, 1950, 73 y.o.   MRN: [id number]  Follow-up: Hearing loss, tinnitus, recurrent dizziness  HPI: The patient is a 73 year old male who returns today for his follow-up evaluation.  The patient was previously seen for bilateral sensorineural hearing loss, bilateral tinnitus, and recurrent dizziness.  He was noted to have benign paroxysmal positional vertigo.  He was successfully treated with Epley maneuver.  He was also fitted with bilateral hearing aids.  The patient returns today reporting no more dizziness since his last visit.  He is still having bilateral tinnitus.  He denies any otalgia, otorrhea, recent change in his hearing, or vertigo.  He wears his hearing aids daily.  Exam: General: Communicates without difficulty, well nourished, no acute distress. Head: Normocephalic, no evidence injury, no tenderness, facial buttresses intact without stepoff. Face/sinus: No tenderness to palpation and percussion. Facial movement is normal and symmetric. Eyes: PERRL, EOMI. No scleral icterus, conjunctivae clear. Neuro: CN II exam reveals vision grossly intact.  No nystagmus at any point of gaze. Ears: Auricles well formed without lesions.  Ear canals are intact without mass or lesion.  No erythema or edema is appreciated.  The TMs are intact without fluid. Nose: External evaluation reveals normal support and skin without lesions.  Dorsum is intact.  Anterior rhinoscopy reveals normal mucosa over anterior aspect of inferior turbinates and intact septum.  No purulence noted. Oral:  Oral cavity and oropharynx are intact, symmetric, without erythema or edema.  Mucosa is moist without lesions. Neck: Full range of motion without pain.  There is no significant lymphadenopathy.  No masses palpable.  Thyroid bed within normal limits to palpation.  Parotid glands and submandibular glands equal bilaterally without mass.  Trachea is midline. Neuro:  CN 2-12 grossly intact.    Assessment: 1.  Subjectively stable bilateral sensorineural hearing loss. 2.  Bilateral tinnitus, secondary to his sensorineural hearing loss. 3.  His BPPV is currently under control.  Plan: 1.  The physical exam findings are reviewed with the patient. 2.  Continue the use of his hearing aids. 3.  The strategies to cope with tinnitus, including the use of masker, hearing aids, tinnitus retraining therapy, and avoidance of caffeine and alcohol are discussed. 4.  The patient will return for reevaluation in 1 year.

## 2023-09-27 DIAGNOSIS — H903 Sensorineural hearing loss, bilateral: Secondary | ICD-10-CM | POA: Insufficient documentation

## 2023-09-27 DIAGNOSIS — H9313 Tinnitus, bilateral: Secondary | ICD-10-CM | POA: Insufficient documentation

## 2023-09-27 DIAGNOSIS — R42 Dizziness and giddiness: Secondary | ICD-10-CM | POA: Insufficient documentation

## 2023-10-07 ENCOUNTER — Other Ambulatory Visit: Payer: Self-pay | Admitting: Family Medicine

## 2023-10-07 DIAGNOSIS — E118 Type 2 diabetes mellitus with unspecified complications: Secondary | ICD-10-CM

## 2023-10-07 DIAGNOSIS — N183 Chronic kidney disease, stage 3 unspecified: Secondary | ICD-10-CM

## 2023-10-07 DIAGNOSIS — K760 Fatty (change of) liver, not elsewhere classified: Secondary | ICD-10-CM

## 2023-10-07 DIAGNOSIS — I10 Essential (primary) hypertension: Secondary | ICD-10-CM

## 2023-10-08 ENCOUNTER — Other Ambulatory Visit: Payer: Self-pay

## 2023-10-08 ENCOUNTER — Encounter: Payer: Self-pay | Admitting: Family Medicine

## 2023-10-08 DIAGNOSIS — I10 Essential (primary) hypertension: Secondary | ICD-10-CM

## 2023-10-08 MED ORDER — ATENOLOL 50 MG PO TABS: 50.0000 mg | ORAL_TABLET | Freq: Every day | ORAL | 0 refills | Status: DC

## 2023-11-19 ENCOUNTER — Other Ambulatory Visit: Payer: Self-pay | Admitting: Family Medicine

## 2023-11-19 DIAGNOSIS — I10 Essential (primary) hypertension: Secondary | ICD-10-CM

## 2023-12-11 ENCOUNTER — Ambulatory Visit (INDEPENDENT_AMBULATORY_CARE_PROVIDER_SITE_OTHER): Admitting: Family Medicine

## 2023-12-11 ENCOUNTER — Encounter: Payer: Self-pay | Admitting: Family Medicine

## 2023-12-11 VITALS — BP 130/72 | HR 69 | Temp 97.9°F | Ht 64.0 in | Wt 164.5 lb

## 2023-12-11 DIAGNOSIS — Z7985 Long-term (current) use of injectable non-insulin antidiabetic drugs: Secondary | ICD-10-CM

## 2023-12-11 DIAGNOSIS — R350 Frequency of micturition: Secondary | ICD-10-CM

## 2023-12-11 DIAGNOSIS — Z23 Encounter for immunization: Secondary | ICD-10-CM | POA: Diagnosis not present

## 2023-12-11 DIAGNOSIS — N401 Enlarged prostate with lower urinary tract symptoms: Secondary | ICD-10-CM

## 2023-12-11 DIAGNOSIS — E118 Type 2 diabetes mellitus with unspecified complications: Secondary | ICD-10-CM

## 2023-12-11 DIAGNOSIS — R399 Unspecified symptoms and signs involving the genitourinary system: Secondary | ICD-10-CM

## 2023-12-11 DIAGNOSIS — N183 Chronic kidney disease, stage 3 unspecified: Secondary | ICD-10-CM | POA: Diagnosis not present

## 2023-12-11 MED ORDER — POLYMYXIN B-TRIMETHOPRIM 10000-0.1 UNIT/ML-% OP SOLN: 2.0000 [drp] | OPHTHALMIC | 0 refills | Status: AC

## 2023-12-11 MED ORDER — SILDENAFIL CITRATE 100 MG PO TABS: 50.0000 mg | ORAL_TABLET | Freq: Every day | ORAL | 11 refills | Status: AC | PRN

## 2023-12-11 NOTE — Progress Notes (Signed)
 Wt Readings from Last 3 Encounters:  12/11/23 164 lb 8 oz (74.6 kg)  05/08/23 161 lb (73 kg)  09/15/22 173 lb 3.2 oz (78.6 kg)     Subjective:    Patient ID: Bradley Baldwin Finder, male    DOB: 10/16/1950, 73 y.o.   MRN: [id number]  HPI Patient has history of stage 3 ckd, dm2, and nafld.  Patient is maintaining a healthy weight at 164 pounds.  He denies any nausea or vomiting or abdominal pain on the GLP-1 agonist.  His blood pressure today is outstanding at 130/72.  He denies any chest pain, shortness of breath, dyspnea on exertion.  He is due for a flu shot and a tetanus shot.  He is also due for prostate cancer screening.  Otherwise he is doing well.  He does complain of some itching at the corner of his left eye.  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  I recommended trying lid scrubs with baby shampoo.  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 90 tablet 2   aspirin  EC 81 MG tablet Take 81 mg by mouth daily.     atenolol   (TENORMIN ) 50 MG tablet Take 1 tablet (50 mg total) by mouth daily. 90 tablet 0   atorvastatin  (LIPITOR) 20 MG tablet Take 1 tablet (20 mg total) by mouth daily. 90 tablet 3   Cetirizine  HCl (ZYRTEC  ALLERGY) 10 MG TBDP Take 10 mg by mouth at bedtime. 30 tablet 0   dapagliflozin  propanediol (FARXIGA ) 10 MG TABS tablet Take 1 tablet (10 mg total) by mouth daily. 90 tablet 2   fluticasone  (FLONASE ) 50 MCG/ACT nasal spray Place 2 sprays into the nose daily. (Patient taking differently: Place 2 sprays into the nose daily. PRN) 16 g 6   glucose blood test strip Use as directed to monitor FSBS 2x daily. Dx E11.9. 100 each 12   hydrOXYzine  (ATARAX /VISTARIL ) 25 MG tablet TAKE 1 TABLET(25 MG) BY MOUTH AT BEDTIME AS NEEDED 90 tablet 1   Lancets (ONETOUCH DELICA PLUS LANCET33G) MISC USE AS DIRECTED TO MONITOR FASTING BLOOD SUGAR TWICE DAILY 100 each 12   lisinopril  (ZESTRIL ) 40 MG tablet TAKE 1 TABLET(40 MG) BY MOUTH DAILY 90 tablet 0   Semaglutide , 2 MG/DOSE, (OZEMPIC , 2 MG/DOSE,) 8 MG/3ML SOPN Inject 2 mg as directed once a week. 9 mL 1   triamcinolone  cream (KENALOG ) 0.1 % Apply 1 application topically 2 (two) times daily. 30 g  0   No current facility-administered medications on file prior to visit.   No Known Allergies Social History   Socioeconomic History   Marital status: Married    Spouse name: Not on file   Number of children: Not on file   Years of education: Not on file   Highest education level: Not on file  Occupational History   Not on file  Tobacco Use   Smoking status: Former    Current packs/day: 2.00    Average packs/day: 2.0 packs/day for 10.0 years (20.0 ttl pk-yrs)    Types: Cigarettes   Smokeless tobacco: Former    Types: Chew   Tobacco comments:    11/28/2011 quit smoking ~ 30 yr ago; chewed some then quit that  Substance and Sexual Activity   Alcohol use: Yes    Alcohol/week: 6.0 standard drinks of alcohol    Types: 6 Cans of beer per week    Comment: 11/28/2011  drink a few beers 3 times/wk; ~ 6pk total/wk   Drug use: No   Sexual activity: Yes  Other Topics Concern   Not on file  Social History Narrative   Not on file   Social Drivers of Health   Financial Resource Strain: Low Risk  (07/16/2020)   Overall Financial Resource Strain (CARDIA)    Difficulty of Paying Living Expenses: Not hard at all  Food Insecurity: No Food Insecurity (07/16/2020)   Hunger Vital Sign    Worried About Running Out of Food in the Last Year: Never true    Ran Out of Food in the Last Year: Never true  Transportation Needs: No Transportation Needs (07/16/2020)   PRAPARE - Administrator, Civil Service (Medical): No    Lack of Transportation (Non-Medical): No  Physical Activity: Inactive (07/16/2020)   Exercise Vital Sign    Days of Exercise per Week: 0 days    Minutes of Exercise per Session: 0 min  Stress: No Stress Concern Present (07/16/2020)   [employer] of Occupational Health - Occupational Stress Questionnaire    Feeling of Stress : Not at all  Social Connections: Moderately Isolated (07/16/2020)   Social Connection and Isolation Panel    Frequency of Communication with Friends and Family: Twice a week    Frequency of Social Gatherings with Friends and Family: Once a week    Attends Religious Services: Never    Database Administrator or Organizations: No    Attends Banker Meetings: Never    Marital Status: Married  Catering Manager Violence: Not At Risk (07/16/2020)   Humiliation, Afraid, Rape, and Kick questionnaire    Fear of Current or Ex-Partner: No    Emotionally Abused: No    Physically Abused: No    Sexually Abused: No     Review of Systems     Objective:   Physical Exam Vitals reviewed.  Cardiovascular:     Rate and Rhythm: Normal rate and regular rhythm.     Heart sounds: Normal heart sounds. No murmur heard. Pulmonary:     Effort: Pulmonary effort is normal. No respiratory distress.     Breath sounds:  Normal breath sounds. No stridor.           Assessment & Plan:  Lower urinary tract symptoms (LUTS) - Plan: sildenafil  (VIAGRA ) 100 MG tablet, PSA  Benign prostatic hyperplasia with urinary frequency - Plan: sildenafil  (VIAGRA ) 100 MG tablet, PSA  Controlled type 2 diabetes mellitus with complication, without long-term current use of  insulin (HCC) - Plan: Hemoglobin A1c, CBC with Differential/Platelet, Comprehensive metabolic panel with GFR, Lipid panel, Microalbumin / creatinine urine ratio  Stage 3 chronic kidney disease, unspecified whether stage 3a or 3b CKD (HCC) - Plan: Hemoglobin A1c, CBC with Differential/Platelet, Comprehensive metabolic panel with GFR, Lipid panel, Microalbumin / creatinine urine ratio I am extremely happy with his blood pressure.  It is well-controlled.  Monitor his renal function by checking a CMP.  Check a microalbumin to creatinine ratio.  If significantly elevated, we could consider kerendia.  Check a lipid panel.  Goal LDL cholesterol is less than 100.  We discussed a coronary artery calcium  score but the patient declines this at the present time.  Monitor his hemoglobin A1c.  Goal hemoglobin A1c is less than 6.5.  Patient received his flu shot today.

## 2023-12-12 LAB — CBC WITH DIFFERENTIAL/PLATELET
Absolute Lymphocytes: 3128 {cells}/uL (ref 850–3900)
Absolute Monocytes: 791 {cells}/uL (ref 200–950)
Basophils Absolute: 60 {cells}/uL (ref 0–200)
Basophils Relative: 0.7 %
Eosinophils Absolute: 221 {cells}/uL (ref 15–500)
Eosinophils Relative: 2.6 %
HCT: 43.2 % (ref 38.5–50.0)
Hemoglobin: 14.9 g/dL (ref 13.2–17.1)
MCH: 34 pg — ABNORMAL HIGH (ref 27.0–33.0)
MCHC: 34.5 g/dL (ref 32.0–36.0)
MCV: 98.6 fL (ref 80.0–100.0)
MPV: 10.4 fL (ref 7.5–12.5)
Monocytes Relative: 9.3 %
Neutro Abs: 4301 {cells}/uL (ref 1500–7800)
Neutrophils Relative %: 50.6 %
Platelets: 254 Thousand/uL (ref 140–400)
RBC: 4.38 Million/uL (ref 4.20–5.80)
RDW: 11.4 % (ref 11.0–15.0)
Total Lymphocyte: 36.8 %
WBC: 8.5 Thousand/uL (ref 3.8–10.8)

## 2023-12-12 LAB — HEMOGLOBIN A1C
Hgb A1c MFr Bld: 6.3 % — ABNORMAL HIGH (ref <5.7)
Mean Plasma Glucose: 134 mg/dL
eAG (mmol/L): 7.4 mmol/L

## 2023-12-12 LAB — COMPREHENSIVE METABOLIC PANEL WITH GFR
AG Ratio: 1.4 (calc) (ref 1.0–2.5)
ALT: 65 U/L — ABNORMAL HIGH (ref 9–46)
AST: 61 U/L — ABNORMAL HIGH (ref 10–35)
Albumin: 4.2 g/dL (ref 3.6–5.1)
Alkaline phosphatase (APISO): 93 U/L (ref 35–144)
BUN/Creatinine Ratio: 20 (calc) (ref 6–22)
BUN: 28 mg/dL — ABNORMAL HIGH (ref 7–25)
CO2: 24 mmol/L (ref 20–32)
Calcium: 9.5 mg/dL (ref 8.6–10.3)
Chloride: 101 mmol/L (ref 98–110)
Creat: 1.43 mg/dL — ABNORMAL HIGH (ref 0.70–1.28)
Globulin: 3.1 g/dL (ref 1.9–3.7)
Glucose, Bld: 119 mg/dL — ABNORMAL HIGH (ref 65–99)
Potassium: 4.6 mmol/L (ref 3.5–5.3)
Sodium: 135 mmol/L (ref 135–146)
Total Bilirubin: 0.9 mg/dL (ref 0.2–1.2)
Total Protein: 7.3 g/dL (ref 6.1–8.1)
eGFR: 52 mL/min/1.73m2 — ABNORMAL LOW (ref >=60)

## 2023-12-12 LAB — MICROALBUMIN / CREATININE URINE RATIO
Creatinine, Urine: 112 mg/dL (ref 20–320)
Microalb Creat Ratio: 1621 mg/g{creat} — ABNORMAL HIGH (ref <30)
Microalb, Ur: 181.5 mg/dL

## 2023-12-12 LAB — LIPID PANEL
Cholesterol: 134 mg/dL (ref <200)
HDL: 49 mg/dL (ref >=40)
LDL Cholesterol (Calc): 63 mg/dL
Non-HDL Cholesterol (Calc): 85 mg/dL (ref <130)
Total CHOL/HDL Ratio: 2.7 (calc) (ref <5.0)
Triglycerides: 134 mg/dL (ref <150)

## 2023-12-12 LAB — PSA: PSA: 0.23 ng/mL (ref <=4.00)

## 2023-12-13 ENCOUNTER — Ambulatory Visit: Payer: Self-pay | Admitting: Family Medicine

## 2023-12-13 DIAGNOSIS — N183 Chronic kidney disease, stage 3 unspecified: Secondary | ICD-10-CM

## 2023-12-14 NOTE — Telephone Encounter (Signed)
 Pt agrees to have a nephrology referral he just prefers somewhere local and in network. [facility]) says [facility] in [place] would be very convenient he says. Please advise.

## 2023-12-19 ENCOUNTER — Encounter: Payer: Self-pay | Admitting: Family Medicine

## 2023-12-31 ENCOUNTER — Encounter: Payer: Self-pay | Admitting: Family Medicine

## 2023-12-31 ENCOUNTER — Telehealth: Payer: Self-pay

## 2023-12-31 NOTE — Telephone Encounter (Signed)
 Copied from CRM #[id number]. Topic: Referral - Question >> Dec 31, 2023 11:00 AM Delon HERO wrote: Reason for CRM: Patient's wife Mercy is calling to report that Nephrology Augustin Ozell PARAS, MD is waiting on demographics and labs before they can schedule appointment.    Please advise with Dr. Noe office- [phone]

## 2024-01-07 ENCOUNTER — Other Ambulatory Visit: Payer: Self-pay | Admitting: Family Medicine

## 2024-01-07 DIAGNOSIS — I10 Essential (primary) hypertension: Secondary | ICD-10-CM

## 2024-01-11 ENCOUNTER — Other Ambulatory Visit: Payer: Self-pay | Admitting: Family Medicine

## 2024-01-11 DIAGNOSIS — I10 Essential (primary) hypertension: Secondary | ICD-10-CM

## 2024-02-08 ENCOUNTER — Encounter: Payer: Self-pay | Admitting: Family Medicine

## 2024-02-24 ENCOUNTER — Other Ambulatory Visit: Payer: Self-pay | Admitting: Family Medicine

## 2024-02-24 DIAGNOSIS — I10 Essential (primary) hypertension: Secondary | ICD-10-CM

## 2024-02-25 NOTE — Telephone Encounter (Signed)
 Requested Prescriptions  Pending Prescriptions Disp Refills   amLODipine  (NORVASC ) 10 MG tablet [Pharmacy Med Name: AMLODIPINE  BESYLATE 10MG TABLETS] 90 tablet 0    Sig: TAKE 1 TABLET(10 MG) BY MOUTH DAILY     Cardiovascular: Calcium  Channel Blockers 2 Passed - 02/25/2024  1:56 PM      Passed - Last BP in normal range    BP Readings from Last 1 Encounters:  12/11/23 130/72         Passed - Last Heart Rate in normal range    Pulse Readings from Last 1 Encounters:  12/11/23 69         Passed - Valid encounter within last 6 months    Recent Outpatient Visits           2 months ago Lower urinary tract symptoms (LUTS)   [place] [facility] Duanne Butler DASEN, MD   9 months ago Controlled type 2 diabetes mellitus with complication, without long-term current use of insulin (HCC)   [place] [facility] Pickard, Butler DASEN, MD   1 year ago Controlled type 2 diabetes mellitus with complication, without long-term current use of insulin (HCC)   [place] [facility] Duanne Butler DASEN, MD   1 year ago Benign essential HTN   [place] [facility] Pickard, Butler DASEN, MD               lisinopril  (ZESTRIL ) 40 MG tablet [Pharmacy Med Name: LISINOPRIL  40MG  TABLETS] 90 tablet 0    Sig: TAKE 1 TABLET(40 MG) BY MOUTH DAILY     Cardiovascular:  ACE Inhibitors Failed - 02/25/2024  1:56 PM      Failed - Cr in normal range and within 180 days    Creat  Date Value Ref Range Status  12/11/2023 1.43 (H) 0.70 - 1.28 mg/dL Final   Creatinine, Urine  Date Value Ref Range Status  12/11/2023 112 20 - 320 mg/dL Final         Passed - K in normal range and within 180 days    Potassium  Date Value Ref Range Status  12/11/2023 4.6 3.5 - 5.3 mmol/L Final         Passed - Patient is not pregnant      Passed - Last BP in normal range    BP Readings from Last 1 Encounters:  12/11/23 130/72         Passed - Valid encounter  within last 6 months    Recent Outpatient Visits           2 months ago Lower urinary tract symptoms (LUTS)   [place] [facility] Duanne Butler DASEN, MD   9 months ago Controlled type 2 diabetes mellitus with complication, without long-term current use of insulin [facility])   [place] [facility] Duanne Butler DASEN, MD   1 year ago Controlled type 2 diabetes mellitus with complication, without long-term current use of insulin [facility])   [place] [facility] Pickard, Butler DASEN, MD   1 year ago Benign essential HTN   [place] [facility] Pickard, Butler DASEN, MD

## 2024-03-06 ENCOUNTER — Encounter: Payer: Self-pay | Admitting: Family Medicine

## 2024-03-07 ENCOUNTER — Other Ambulatory Visit: Payer: Self-pay | Admitting: Family Medicine

## 2024-03-07 DIAGNOSIS — E118 Type 2 diabetes mellitus with unspecified complications: Secondary | ICD-10-CM
